# Patient Record
Sex: Male | Born: 1940 | Race: Black or African American | Hispanic: No | Smoking: Former smoker
Health system: Southern US, Community
[De-identification: ages and names within clinical notes are randomized; demographics above are authoritative.]

## PROBLEM LIST (undated history)

## (undated) DIAGNOSIS — I2510 Atherosclerotic heart disease of native coronary artery without angina pectoris: Secondary | ICD-10-CM

## (undated) DIAGNOSIS — M6281 Muscle weakness (generalized): Secondary | ICD-10-CM

## (undated) DIAGNOSIS — R609 Edema, unspecified: Secondary | ICD-10-CM

## (undated) DIAGNOSIS — D649 Anemia, unspecified: Secondary | ICD-10-CM

## (undated) DIAGNOSIS — E785 Hyperlipidemia, unspecified: Secondary | ICD-10-CM

## (undated) DIAGNOSIS — E11319 Type 2 diabetes mellitus with unspecified diabetic retinopathy without macular edema: Secondary | ICD-10-CM

## (undated) DIAGNOSIS — F329 Major depressive disorder, single episode, unspecified: Secondary | ICD-10-CM

## (undated) DIAGNOSIS — I509 Heart failure, unspecified: Secondary | ICD-10-CM

## (undated) DIAGNOSIS — I10 Essential (primary) hypertension: Secondary | ICD-10-CM

## (undated) DIAGNOSIS — G629 Polyneuropathy, unspecified: Secondary | ICD-10-CM

## (undated) DIAGNOSIS — E261 Secondary hyperaldosteronism: Secondary | ICD-10-CM

## (undated) DIAGNOSIS — F32A Depression, unspecified: Secondary | ICD-10-CM

## (undated) DIAGNOSIS — N189 Chronic kidney disease, unspecified: Secondary | ICD-10-CM

## (undated) DIAGNOSIS — G441 Vascular headache, not elsewhere classified: Secondary | ICD-10-CM

## (undated) DIAGNOSIS — I209 Angina pectoris, unspecified: Secondary | ICD-10-CM

## (undated) DIAGNOSIS — E119 Type 2 diabetes mellitus without complications: Secondary | ICD-10-CM

## (undated) HISTORY — DX: Depression, unspecified: F32.A

## (undated) HISTORY — DX: Major depressive disorder, single episode, unspecified: F32.9

## (undated) HISTORY — DX: Edema, unspecified: R60.9

## (undated) HISTORY — DX: Hyperlipidemia, unspecified: E78.5

## (undated) HISTORY — DX: Type 2 diabetes mellitus without complications: E11.9

## (undated) HISTORY — DX: Essential (primary) hypertension: I10

## (undated) HISTORY — PX: APPENDECTOMY: SHX54

## (undated) HISTORY — PX: LEG AMPUTATION BELOW KNEE: SHX694

## (undated) HISTORY — DX: Type 2 diabetes mellitus with unspecified diabetic retinopathy without macular edema: E11.319

## (undated) HISTORY — DX: Chronic kidney disease, unspecified: N18.9

---

## 2012-07-02 DIAGNOSIS — D649 Anemia, unspecified: Secondary | ICD-10-CM | POA: Diagnosis not present

## 2012-07-02 DIAGNOSIS — I10 Essential (primary) hypertension: Secondary | ICD-10-CM | POA: Diagnosis not present

## 2012-07-02 DIAGNOSIS — E1129 Type 2 diabetes mellitus with other diabetic kidney complication: Secondary | ICD-10-CM | POA: Diagnosis not present

## 2012-07-02 DIAGNOSIS — F3289 Other specified depressive episodes: Secondary | ICD-10-CM | POA: Diagnosis not present

## 2012-07-04 DIAGNOSIS — Z79899 Other long term (current) drug therapy: Secondary | ICD-10-CM | POA: Diagnosis not present

## 2012-07-04 DIAGNOSIS — E119 Type 2 diabetes mellitus without complications: Secondary | ICD-10-CM | POA: Diagnosis not present

## 2012-07-04 DIAGNOSIS — E039 Hypothyroidism, unspecified: Secondary | ICD-10-CM | POA: Diagnosis not present

## 2012-07-30 DIAGNOSIS — E785 Hyperlipidemia, unspecified: Secondary | ICD-10-CM | POA: Diagnosis not present

## 2012-07-30 DIAGNOSIS — R609 Edema, unspecified: Secondary | ICD-10-CM | POA: Diagnosis not present

## 2012-07-30 DIAGNOSIS — I10 Essential (primary) hypertension: Secondary | ICD-10-CM | POA: Diagnosis not present

## 2012-07-30 DIAGNOSIS — K5900 Constipation, unspecified: Secondary | ICD-10-CM | POA: Diagnosis not present

## 2012-07-30 DIAGNOSIS — E1129 Type 2 diabetes mellitus with other diabetic kidney complication: Secondary | ICD-10-CM | POA: Diagnosis not present

## 2012-07-30 DIAGNOSIS — I2510 Atherosclerotic heart disease of native coronary artery without angina pectoris: Secondary | ICD-10-CM | POA: Diagnosis not present

## 2012-08-15 DIAGNOSIS — H538 Other visual disturbances: Secondary | ICD-10-CM | POA: Diagnosis not present

## 2012-08-15 DIAGNOSIS — E1129 Type 2 diabetes mellitus with other diabetic kidney complication: Secondary | ICD-10-CM | POA: Diagnosis not present

## 2012-08-15 DIAGNOSIS — E785 Hyperlipidemia, unspecified: Secondary | ICD-10-CM | POA: Diagnosis not present

## 2012-08-15 DIAGNOSIS — I10 Essential (primary) hypertension: Secondary | ICD-10-CM | POA: Diagnosis not present

## 2012-08-18 ENCOUNTER — Emergency Department (HOSPITAL_COMMUNITY)
Admission: EM | Admit: 2012-08-18 | Discharge: 2012-08-18 | Disposition: A | Discharge status: Home / Self Care | Payer: Medicare Other | Attending: Emergency Medicine | Admitting: Emergency Medicine

## 2012-08-18 ENCOUNTER — Emergency Department (HOSPITAL_COMMUNITY): Payer: Medicare Other

## 2012-08-18 DIAGNOSIS — S88119A Complete traumatic amputation at level between knee and ankle, unspecified lower leg, initial encounter: Secondary | ICD-10-CM | POA: Diagnosis not present

## 2012-08-18 DIAGNOSIS — H35379 Puckering of macula, unspecified eye: Secondary | ICD-10-CM | POA: Diagnosis not present

## 2012-08-18 DIAGNOSIS — E1159 Type 2 diabetes mellitus with other circulatory complications: Secondary | ICD-10-CM | POA: Diagnosis not present

## 2012-08-18 DIAGNOSIS — G319 Degenerative disease of nervous system, unspecified: Secondary | ICD-10-CM | POA: Diagnosis not present

## 2012-08-18 DIAGNOSIS — R6889 Other general symptoms and signs: Secondary | ICD-10-CM | POA: Diagnosis not present

## 2012-08-18 DIAGNOSIS — Z79899 Other long term (current) drug therapy: Secondary | ICD-10-CM | POA: Insufficient documentation

## 2012-08-18 DIAGNOSIS — E1139 Type 2 diabetes mellitus with other diabetic ophthalmic complication: Secondary | ICD-10-CM | POA: Diagnosis not present

## 2012-08-18 DIAGNOSIS — E11319 Type 2 diabetes mellitus with unspecified diabetic retinopathy without macular edema: Secondary | ICD-10-CM | POA: Diagnosis not present

## 2012-08-18 DIAGNOSIS — S0990XA Unspecified injury of head, initial encounter: Secondary | ICD-10-CM | POA: Diagnosis not present

## 2012-08-18 DIAGNOSIS — H4310 Vitreous hemorrhage, unspecified eye: Secondary | ICD-10-CM | POA: Diagnosis not present

## 2012-08-18 DIAGNOSIS — Y9389 Activity, other specified: Secondary | ICD-10-CM | POA: Insufficient documentation

## 2012-08-18 DIAGNOSIS — Y9289 Other specified places as the place of occurrence of the external cause: Secondary | ICD-10-CM | POA: Insufficient documentation

## 2012-08-18 DIAGNOSIS — W050XXA Fall from non-moving wheelchair, initial encounter: Secondary | ICD-10-CM | POA: Insufficient documentation
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
## 2012-08-18 NOTE — ED Notes (Signed)
Called [facility] spoke with DON about pt. Discharge.

## 2012-08-18 NOTE — ED Notes (Signed)
Patient transported to CT 

## 2012-08-26 DIAGNOSIS — E11359 Type 2 diabetes mellitus with proliferative diabetic retinopathy without macular edema: Secondary | ICD-10-CM | POA: Diagnosis not present

## 2012-08-26 DIAGNOSIS — E1139 Type 2 diabetes mellitus with other diabetic ophthalmic complication: Secondary | ICD-10-CM | POA: Diagnosis not present

## 2012-08-26 DIAGNOSIS — H4310 Vitreous hemorrhage, unspecified eye: Secondary | ICD-10-CM | POA: Diagnosis not present

## 2012-08-26 DIAGNOSIS — E119 Type 2 diabetes mellitus without complications: Secondary | ICD-10-CM | POA: Diagnosis not present

## 2012-08-27 DIAGNOSIS — I10 Essential (primary) hypertension: Secondary | ICD-10-CM | POA: Diagnosis not present

## 2012-08-27 DIAGNOSIS — E1129 Type 2 diabetes mellitus with other diabetic kidney complication: Secondary | ICD-10-CM | POA: Diagnosis not present

## 2012-08-27 DIAGNOSIS — E785 Hyperlipidemia, unspecified: Secondary | ICD-10-CM | POA: Diagnosis not present

## 2012-08-27 DIAGNOSIS — I2510 Atherosclerotic heart disease of native coronary artery without angina pectoris: Secondary | ICD-10-CM | POA: Diagnosis not present

## 2012-08-27 DIAGNOSIS — K5900 Constipation, unspecified: Secondary | ICD-10-CM | POA: Diagnosis not present

## 2012-08-27 DIAGNOSIS — R609 Edema, unspecified: Secondary | ICD-10-CM | POA: Diagnosis not present

## 2012-09-03 DIAGNOSIS — E1129 Type 2 diabetes mellitus with other diabetic kidney complication: Secondary | ICD-10-CM | POA: Diagnosis not present

## 2012-09-24 DIAGNOSIS — K5900 Constipation, unspecified: Secondary | ICD-10-CM | POA: Diagnosis not present

## 2012-09-24 DIAGNOSIS — I2510 Atherosclerotic heart disease of native coronary artery without angina pectoris: Secondary | ICD-10-CM | POA: Diagnosis not present

## 2012-09-24 DIAGNOSIS — R609 Edema, unspecified: Secondary | ICD-10-CM | POA: Diagnosis not present

## 2012-09-24 DIAGNOSIS — I10 Essential (primary) hypertension: Secondary | ICD-10-CM | POA: Diagnosis not present

## 2012-09-24 DIAGNOSIS — E1129 Type 2 diabetes mellitus with other diabetic kidney complication: Secondary | ICD-10-CM | POA: Diagnosis not present

## 2012-09-24 DIAGNOSIS — E785 Hyperlipidemia, unspecified: Secondary | ICD-10-CM | POA: Diagnosis not present

## 2012-10-29 DIAGNOSIS — E1129 Type 2 diabetes mellitus with other diabetic kidney complication: Secondary | ICD-10-CM | POA: Diagnosis not present

## 2012-10-29 DIAGNOSIS — I2510 Atherosclerotic heart disease of native coronary artery without angina pectoris: Secondary | ICD-10-CM | POA: Diagnosis not present

## 2012-10-29 DIAGNOSIS — E785 Hyperlipidemia, unspecified: Secondary | ICD-10-CM | POA: Diagnosis not present

## 2012-10-29 DIAGNOSIS — K5900 Constipation, unspecified: Secondary | ICD-10-CM | POA: Diagnosis not present

## 2012-10-29 DIAGNOSIS — I10 Essential (primary) hypertension: Secondary | ICD-10-CM | POA: Diagnosis not present

## 2012-10-29 DIAGNOSIS — R609 Edema, unspecified: Secondary | ICD-10-CM | POA: Diagnosis not present

## 2012-10-31 DIAGNOSIS — I10 Essential (primary) hypertension: Secondary | ICD-10-CM | POA: Diagnosis not present

## 2012-10-31 DIAGNOSIS — E119 Type 2 diabetes mellitus without complications: Secondary | ICD-10-CM | POA: Diagnosis not present

## 2012-10-31 DIAGNOSIS — G608 Other hereditary and idiopathic neuropathies: Secondary | ICD-10-CM | POA: Diagnosis not present

## 2012-11-05 DIAGNOSIS — E1129 Type 2 diabetes mellitus with other diabetic kidney complication: Secondary | ICD-10-CM | POA: Diagnosis not present

## 2012-11-06 DIAGNOSIS — R0989 Other specified symptoms and signs involving the circulatory and respiratory systems: Secondary | ICD-10-CM | POA: Diagnosis not present

## 2012-11-12 DIAGNOSIS — S88919A Complete traumatic amputation of unspecified lower leg, level unspecified, initial encounter: Secondary | ICD-10-CM | POA: Diagnosis not present

## 2012-11-12 DIAGNOSIS — Z7982 Long term (current) use of aspirin: Secondary | ICD-10-CM | POA: Diagnosis not present

## 2012-11-12 DIAGNOSIS — Z87891 Personal history of nicotine dependence: Secondary | ICD-10-CM | POA: Diagnosis not present

## 2012-11-12 DIAGNOSIS — E1139 Type 2 diabetes mellitus with other diabetic ophthalmic complication: Secondary | ICD-10-CM | POA: Diagnosis not present

## 2012-11-12 DIAGNOSIS — E785 Hyperlipidemia, unspecified: Secondary | ICD-10-CM | POA: Diagnosis not present

## 2012-11-12 DIAGNOSIS — E11359 Type 2 diabetes mellitus with proliferative diabetic retinopathy without macular edema: Secondary | ICD-10-CM | POA: Diagnosis not present

## 2012-11-12 DIAGNOSIS — E119 Type 2 diabetes mellitus without complications: Secondary | ICD-10-CM | POA: Diagnosis not present

## 2012-11-12 DIAGNOSIS — I129 Hypertensive chronic kidney disease with stage 1 through stage 4 chronic kidney disease, or unspecified chronic kidney disease: Secondary | ICD-10-CM | POA: Diagnosis not present

## 2012-11-12 DIAGNOSIS — N189 Chronic kidney disease, unspecified: Secondary | ICD-10-CM | POA: Diagnosis not present

## 2012-11-12 DIAGNOSIS — I491 Atrial premature depolarization: Secondary | ICD-10-CM | POA: Diagnosis not present

## 2012-11-12 DIAGNOSIS — Z961 Presence of intraocular lens: Secondary | ICD-10-CM | POA: Diagnosis not present

## 2012-11-12 DIAGNOSIS — Z794 Long term (current) use of insulin: Secondary | ICD-10-CM | POA: Diagnosis not present

## 2012-11-12 DIAGNOSIS — I2510 Atherosclerotic heart disease of native coronary artery without angina pectoris: Secondary | ICD-10-CM | POA: Diagnosis not present

## 2012-11-12 DIAGNOSIS — H3340 Traction detachment of retina, unspecified eye: Secondary | ICD-10-CM | POA: Diagnosis not present

## 2012-11-12 DIAGNOSIS — Z9849 Cataract extraction status, unspecified eye: Secondary | ICD-10-CM | POA: Diagnosis not present

## 2012-11-12 DIAGNOSIS — H4310 Vitreous hemorrhage, unspecified eye: Secondary | ICD-10-CM | POA: Diagnosis not present

## 2012-11-12 HISTORY — PX: OTHER SURGICAL HISTORY: SHX169

## 2012-12-17 DIAGNOSIS — E785 Hyperlipidemia, unspecified: Secondary | ICD-10-CM | POA: Diagnosis not present

## 2012-12-17 DIAGNOSIS — E1129 Type 2 diabetes mellitus with other diabetic kidney complication: Secondary | ICD-10-CM | POA: Diagnosis not present

## 2012-12-17 DIAGNOSIS — I2510 Atherosclerotic heart disease of native coronary artery without angina pectoris: Secondary | ICD-10-CM | POA: Diagnosis not present

## 2012-12-17 DIAGNOSIS — I10 Essential (primary) hypertension: Secondary | ICD-10-CM | POA: Diagnosis not present

## 2012-12-17 DIAGNOSIS — R609 Edema, unspecified: Secondary | ICD-10-CM | POA: Diagnosis not present

## 2012-12-31 DIAGNOSIS — G608 Other hereditary and idiopathic neuropathies: Secondary | ICD-10-CM | POA: Diagnosis not present

## 2013-01-21 ENCOUNTER — Encounter: Payer: Self-pay | Admitting: Adult Health

## 2013-01-21 ENCOUNTER — Non-Acute Institutional Stay (SKILLED_NURSING_FACILITY): Payer: Medicare Other | Admitting: Adult Health

## 2013-01-21 DIAGNOSIS — K5900 Constipation, unspecified: Secondary | ICD-10-CM | POA: Insufficient documentation

## 2013-01-21 DIAGNOSIS — R609 Edema, unspecified: Secondary | ICD-10-CM

## 2013-01-21 DIAGNOSIS — N058 Unspecified nephritic syndrome with other morphologic changes: Secondary | ICD-10-CM

## 2013-01-21 DIAGNOSIS — E785 Hyperlipidemia, unspecified: Secondary | ICD-10-CM | POA: Insufficient documentation

## 2013-01-21 DIAGNOSIS — I10 Essential (primary) hypertension: Secondary | ICD-10-CM

## 2013-01-21 DIAGNOSIS — I2510 Atherosclerotic heart disease of native coronary artery without angina pectoris: Secondary | ICD-10-CM | POA: Diagnosis not present

## 2013-01-21 DIAGNOSIS — E1129 Type 2 diabetes mellitus with other diabetic kidney complication: Secondary | ICD-10-CM | POA: Diagnosis not present

## 2013-01-21 DIAGNOSIS — F3289 Other specified depressive episodes: Secondary | ICD-10-CM | POA: Insufficient documentation

## 2013-01-21 DIAGNOSIS — E1139 Type 2 diabetes mellitus with other diabetic ophthalmic complication: Secondary | ICD-10-CM | POA: Diagnosis not present

## 2013-01-21 DIAGNOSIS — E11319 Type 2 diabetes mellitus with unspecified diabetic retinopathy without macular edema: Secondary | ICD-10-CM

## 2013-01-22 ENCOUNTER — Non-Acute Institutional Stay (SKILLED_NURSING_FACILITY): Payer: Medicare Other | Admitting: Internal Medicine

## 2013-01-22 DIAGNOSIS — H5710 Ocular pain, unspecified eye: Secondary | ICD-10-CM | POA: Diagnosis not present

## 2013-01-22 DIAGNOSIS — H5711 Ocular pain, right eye: Secondary | ICD-10-CM

## 2013-01-22 NOTE — Progress Notes (Signed)
Patient ID: Marc Wheeler, male   DOB: May 11, 1941, 72 y.o.   MRN: [id number]  CC- right eye pain  HPI- Ranferi Clingan has been having pain with redness and watering in his right eye for 2-3 days now. He has hx of retinal detachment in right eye and recently underwent surgery in January. He has had follow up with ophtalmology after that in February. This problem started acutely 2 days back No pain or redness in left eye.  Denies any acute change in vision  Review of Systems  Eyes: Positive for pain, discharge and redness. Negative for blurred vision, double vision and photophobia.  Neurological: Negative for headaches.   BP 141/87  Pulse 82  Temp(Src) 98 F (36.7 C)  Resp 16  Ht 4\' 4"  (1.321 m)  Wt 223 lb (101.152 kg)  BMI 57.97 kg/m2  SpO2 98%  Physical Exam  Constitutional: He appears well-developed and well-nourished.  HENT:  Head: Normocephalic and atraumatic.  Mouth/Throat: Oropharynx is clear and moist.  Eyes: EOM are normal. Pupils are equal, round, and reactive to light. Right eye exhibits discharge. Right eye exhibits no exudate and no hordeolum. No foreign body present in the right eye. Right conjunctiva is injected. Right conjunctiva has a hemorrhage. No scleral icterus.  normal left eye  ASSESSMENT/PLAN-  Acute right eye pain New onset with redness and lacrimation. Will have him be seen by ophtho for retinal exam-appointment being made. Will start him prophylactically on ciprofloxacin eye drop every 2 hr wile awake in right eye for now. If pt has double vision or worsening of vision or headache, to notify immediately

## 2013-01-22 NOTE — Assessment & Plan Note (Signed)
New onset with redness and lacrimation. Will have him be seen by ophtho for retinal exam-appointment being made. Will start him prophylactically on ciprofloxacin eye drop every 2 hr wile awake in right eye for now. If pt has double vision or worsening of vision or headache, to notify immediately

## 2013-01-23 DIAGNOSIS — H3340 Traction detachment of retina, unspecified eye: Secondary | ICD-10-CM | POA: Diagnosis not present

## 2013-01-23 DIAGNOSIS — Z961 Presence of intraocular lens: Secondary | ICD-10-CM | POA: Diagnosis not present

## 2013-01-23 DIAGNOSIS — E11359 Type 2 diabetes mellitus with proliferative diabetic retinopathy without macular edema: Secondary | ICD-10-CM | POA: Diagnosis not present

## 2013-01-23 DIAGNOSIS — E119 Type 2 diabetes mellitus without complications: Secondary | ICD-10-CM | POA: Diagnosis not present

## 2013-01-23 DIAGNOSIS — H209 Unspecified iridocyclitis: Secondary | ICD-10-CM | POA: Diagnosis not present

## 2013-04-09 DIAGNOSIS — E1139 Type 2 diabetes mellitus with other diabetic ophthalmic complication: Secondary | ICD-10-CM | POA: Diagnosis not present

## 2013-04-09 DIAGNOSIS — E11311 Type 2 diabetes mellitus with unspecified diabetic retinopathy with macular edema: Secondary | ICD-10-CM | POA: Diagnosis not present

## 2013-04-09 DIAGNOSIS — E11359 Type 2 diabetes mellitus with proliferative diabetic retinopathy without macular edema: Secondary | ICD-10-CM | POA: Diagnosis not present

## 2013-04-09 DIAGNOSIS — H35379 Puckering of macula, unspecified eye: Secondary | ICD-10-CM | POA: Diagnosis not present

## 2013-04-09 DIAGNOSIS — H4310 Vitreous hemorrhage, unspecified eye: Secondary | ICD-10-CM | POA: Diagnosis not present

## 2013-04-17 ENCOUNTER — Non-Acute Institutional Stay (SKILLED_NURSING_FACILITY): Payer: Medicare Other | Admitting: Internal Medicine

## 2013-04-17 DIAGNOSIS — R609 Edema, unspecified: Secondary | ICD-10-CM

## 2013-04-17 DIAGNOSIS — I10 Essential (primary) hypertension: Secondary | ICD-10-CM

## 2013-04-17 DIAGNOSIS — N058 Unspecified nephritic syndrome with other morphologic changes: Secondary | ICD-10-CM

## 2013-04-17 DIAGNOSIS — E785 Hyperlipidemia, unspecified: Secondary | ICD-10-CM

## 2013-04-17 DIAGNOSIS — IMO0002 Reserved for concepts with insufficient information to code with codable children: Secondary | ICD-10-CM

## 2013-04-17 DIAGNOSIS — E1165 Type 2 diabetes mellitus with hyperglycemia: Secondary | ICD-10-CM

## 2013-04-17 DIAGNOSIS — E1129 Type 2 diabetes mellitus with other diabetic kidney complication: Secondary | ICD-10-CM | POA: Diagnosis not present

## 2013-04-17 NOTE — Progress Notes (Signed)
Patient ID: Marc Wheeler, male   DOB: 12-28-1940, 72 y.o.   MRN: [id number]   [facility]  Code Status: full code  No Known Allergies  Chief Complaint: medical management of chronic illness  HPI:  Patient seen today for routine visit. He denies any complaints. No new concerns from staff. He is non complaint with his diet. His bp remains stable  Review of Systems  Constitutional: Negative for fever, chills and diaphoresis.  HENT: Negative for congestion.   Eyes: Negative for blurred vision.  Respiratory: Negative for cough and shortness of breath.   Cardiovascular: Negative for chest pain, palpitations and leg swelling.  Gastrointestinal: Negative for heartburn, nausea, vomiting and abdominal pain.  Genitourinary: Negative for dysuria.  Musculoskeletal: Negative for myalgias and falls.  Skin: Negative for rash.  Neurological: Negative for dizziness and headaches.  Psychiatric/Behavioral: Negative for depression and memory loss.     Past Medical History  Diagnosis Date  . Diabetes mellitus without complication   . Hyperlipidemia   . Hypertension   . Chronic kidney disease   . Edema   . Depression   . Diabetic retinopathy    Past Surgical History  Procedure Laterality Date  . Leg amputation below knee Bilateral   . Right retinal detachment repair  1 22 14    Social History:   reports that he has quit smoking. He does not have any smokeless tobacco history on file. His alcohol and drug histories are not on file.  Family History  Problem Relation Age of Onset  . Family history unknown: Yes    Medications: Patient's Medications  New Prescriptions   No medications on file  Previous Medications   ACETAMINOPHEN (TYLENOL) 325 MG TABLET    Take 650 mg by mouth every 4 (four) hours as needed for pain.   AMLODIPINE (NORVASC) 10 MG TABLET    Take 10 mg by mouth daily.   ASPIRIN EC 81 MG TABLET    Take 81 mg by mouth daily.   ATORVASTATIN (LIPITOR) 80 MG TABLET     Take 80 mg by mouth daily.   B COMPLEX-VITAMIN C-FOLIC ACID (NEPHRO-VITE) 0.8 MG TABS    Take 0.8 mg by mouth at bedtime.   CLONIDINE (CATAPRES - DOSED IN MG/24 HR) 0.2 MG/24HR PATCH    Place 1 patch onto the skin once a week.   DOCUSATE SODIUM (COLACE) 100 MG CAPSULE    Take 100 mg by mouth 2 (two) times daily.   FISH OIL-OMEGA-3 FATTY ACIDS 1000 MG CAPSULE    Take 1 g by mouth daily.   FUROSEMIDE (LASIX) 80 MG TABLET    Take 60 mg by mouth daily.    HYDRALAZINE (APRESOLINE) 10 MG TABLET    Take 10 mg by mouth 2 (two) times daily.   INSULIN ASPART (NOVOLOG) 100 UNIT/ML INJECTION    Inject 25 Units into the skin 3 (three) times daily before meals. 25 units prior to meals with an additional 5 units for cbg >=150   INSULIN GLARGINE (LANTUS) 100 UNIT/ML INJECTION    Inject 100 Units into the skin at bedtime.    LINAGLIPTIN (TRADJENTA) 5 MG TABS TABLET    Take 5 mg by mouth daily.   LOPERAMIDE (IMODIUM A-D) 2 MG TABLET    Take 2 mg by mouth 4 (four) times daily as needed. For diarrhea   METOPROLOL (LOPRESSOR) 100 MG TABLET    Take 100 mg by mouth 2 (two) times daily.   POLYETHYL GLYCOL-PROPYL GLYCOL (SYSTANE) 0.4-0.3 %  SOLN    Apply 1 drop to eye daily. Both eyes  Modified Medications   No medications on file  Discontinued Medications   BRIMONIDINE (ALPHAGAN P) 0.1 % SOLN    Place 1 drop into the right eye 3 (three) times daily.   DORZOLAMIDE-TIMOLOL (COSOPT) 22.3-6.8 MG/ML OPHTHALMIC SOLUTION    Place 1 drop into the right eye 2 (two) times daily.   HYDROCODONE-ACETAMINOPHEN (VICODIN) 5-500 MG PER TABLET    Take 1 tablet by mouth every 4 (four) hours as needed. For pain   MIRTAZAPINE (REMERON) 15 MG TABLET    Take 15 mg by mouth at bedtime.   PREDNISOLONE ACETATE (PRED FORTE) 1 % OPHTHALMIC SUSPENSION    Place 1 drop into the right eye 4 (four) times daily.   TOBRAMYCIN-DEXAMETHASONE (TOBRADEX) OPHTHALMIC OINTMENT    Place 1 application into the right eye at bedtime. 1/2 inch     Physical  Exam: Filed Vitals:   04/17/13 1353  BP: 128/74  Pulse: 68  Temp: 96.6 F (35.9 C)  Resp: 18  Height: 4\' 4"  (1.321 m)  Weight: 225 lb (102.059 kg)  SpO2: 95%   Physical Exam  Constitutional: He is oriented to person, place, and time.  Well built male in NAD  HENT:  Head: Normocephalic and atraumatic.  Mouth/Throat: Oropharynx is clear and moist.  Eyes: Pupils are equal, round, and reactive to light.  Neck: Normal range of motion. Neck supple. No tracheal deviation present.  Cardiovascular: Normal rate and regular rhythm.   Pulmonary/Chest: Effort normal and breath sounds normal.  Abdominal: Soft. Bowel sounds are normal.  Musculoskeletal: Normal range of motion.  B/l BKA  Lymphadenopathy:    He has no cervical adenopathy.  Neurological: He is alert and oriented to person, place, and time.  Skin: Skin is warm and dry. He is not diaphoretic.  Psychiatric: He has a normal mood and affect.    Labs reviewed:  12/31/12 Na 141, k 4.2, bun 34, cr 1.88, ca 9.3, glu 199  1/14 a1c 10.5, t.chol 112, tg 204, hdl 24, ldl 47  Assessment/Plan  DM type 2 uncontrolled cbg averages between 160-368 at night time and 136-443 in daytime. Non compliant with diet. Will change his lantus to 55 u bid from 100 u daily. Change novolog to 25 u tid only. Recheck a1c. Continue statin and ASA. Dietary compliance reinforced  Hyperlipidemia Recheck flp. Continue statin and fish oil  HTN bp remains stable. Continue amlodipine, clonidine, hydralazine and lasix with metoprolol  Edema Stable on current dose of lasix. Will check bmp  Labs/tests ordered- a1c, flp, cbc

## 2013-04-20 DIAGNOSIS — I10 Essential (primary) hypertension: Secondary | ICD-10-CM | POA: Diagnosis not present

## 2013-04-20 DIAGNOSIS — E11319 Type 2 diabetes mellitus with unspecified diabetic retinopathy without macular edema: Secondary | ICD-10-CM | POA: Diagnosis not present

## 2013-04-20 DIAGNOSIS — E785 Hyperlipidemia, unspecified: Secondary | ICD-10-CM | POA: Diagnosis not present

## 2013-04-20 DIAGNOSIS — E119 Type 2 diabetes mellitus without complications: Secondary | ICD-10-CM | POA: Diagnosis not present

## 2013-04-21 ENCOUNTER — Non-Acute Institutional Stay (SKILLED_NURSING_FACILITY): Payer: Medicare Other | Admitting: Internal Medicine

## 2013-04-21 DIAGNOSIS — E1129 Type 2 diabetes mellitus with other diabetic kidney complication: Secondary | ICD-10-CM | POA: Diagnosis not present

## 2013-04-21 DIAGNOSIS — N058 Unspecified nephritic syndrome with other morphologic changes: Secondary | ICD-10-CM | POA: Diagnosis not present

## 2013-04-21 DIAGNOSIS — E1165 Type 2 diabetes mellitus with hyperglycemia: Secondary | ICD-10-CM

## 2013-04-21 DIAGNOSIS — E785 Hyperlipidemia, unspecified: Secondary | ICD-10-CM

## 2013-04-21 DIAGNOSIS — IMO0002 Reserved for concepts with insufficient information to code with codable children: Secondary | ICD-10-CM

## 2013-04-21 MED ORDER — INSULIN GLARGINE 100 UNIT/ML ~~LOC~~ SOLN: 55.0000 [IU] | Freq: Two times a day (BID) | SUBCUTANEOUS | Status: DC

## 2013-04-21 MED ORDER — INSULIN ASPART 100 UNIT/ML ~~LOC~~ SOLN: 28.0000 [IU] | Freq: Three times a day (TID) | SUBCUTANEOUS | Status: DC

## 2013-04-21 NOTE — Progress Notes (Signed)
Patient ID: Marc Wheeler, male   DOB: 07-08-1941, 72 y.o.   MRN: [id number] Location:  [facility] Provider:  [name]. Renato Gails, D.O., C.M.D.  Code Status:  Full code  Chief Complaint: AV:  Uncontrolled diabetes, lab results  HPI:  72 yo nonadherent black male long term care resident seen for acute visit to review glucose.    Review of Systems:  Review of Systems  Constitutional: Negative for fever, chills and weight loss.  HENT: Negative for congestion.   Eyes: Positive for blurred vision.  Respiratory: Negative for shortness of breath.   Cardiovascular: Positive for leg swelling. Negative for chest pain.  Gastrointestinal: Negative for constipation.  Genitourinary: Negative for dysuria.  Musculoskeletal: Negative for falls and myalgias.  Neurological: Negative for dizziness, loss of consciousness and headaches.  Endo/Heme/Allergies: Does not bruise/bleed easily.  Psychiatric/Behavioral: Negative for memory loss.     Medications: Patient's Medications  New Prescriptions   No medications on file  Previous Medications   ACETAMINOPHEN (TYLENOL) 325 MG TABLET    Take 650 mg by mouth every 4 (four) hours as needed for pain.   AMLODIPINE (NORVASC) 10 MG TABLET    Take 10 mg by mouth daily.   ASPIRIN EC 81 MG TABLET    Take 81 mg by mouth daily.   ATORVASTATIN (LIPITOR) 80 MG TABLET    Take 80 mg by mouth daily.   B COMPLEX-VITAMIN C-FOLIC ACID (NEPHRO-VITE) 0.8 MG TABS    Take 0.8 mg by mouth at bedtime.   CLONIDINE (CATAPRES - DOSED IN MG/24 HR) 0.2 MG/24HR PATCH    Place 1 patch onto the skin once a week.   DOCUSATE SODIUM (COLACE) 100 MG CAPSULE    Take 100 mg by mouth 2 (two) times daily.   FISH OIL-OMEGA-3 FATTY ACIDS 1000 MG CAPSULE    Take 1 g by mouth daily.   FUROSEMIDE (LASIX) 80 MG TABLET    Take 60 mg by mouth daily.    HYDRALAZINE (APRESOLINE) 10 MG TABLET    Take 10 mg by mouth 2 (two) times daily.   INSULIN ASPART (NOVOLOG) 100 UNIT/ML INJECTION     Inject 25 Units into the skin 3 (three) times daily before meals. 25 units prior to meals with an additional 5 units for cbg >=150   INSULIN GLARGINE (LANTUS) 100 UNIT/ML INJECTION    Inject 100 Units into the skin at bedtime.    LINAGLIPTIN (TRADJENTA) 5 MG TABS TABLET    Take 5 mg by mouth daily.   LOPERAMIDE (IMODIUM A-D) 2 MG TABLET    Take 2 mg by mouth 4 (four) times daily as needed. For diarrhea   METOPROLOL (LOPRESSOR) 100 MG TABLET    Take 100 mg by mouth 2 (two) times daily.   POLYETHYL GLYCOL-PROPYL GLYCOL (SYSTANE) 0.4-0.3 % SOLN    Apply 1 drop to eye daily. Both eyes  Modified Medications   No medications on file  Discontinued Medications   No medications on file    Physical Exam:  Physical Exam  Constitutional: He is oriented to person, place, and time. He appears well-developed and well-nourished. No distress.  Neck: Neck supple. No JVD present.  Cardiovascular: Normal rate, regular rhythm and normal heart sounds.   Edema of bilateral stumps  Pulmonary/Chest: Effort normal and breath sounds normal. No respiratory distress.  Abdominal: Soft. Bowel sounds are normal. He exhibits no distension. There is no tenderness.  Musculoskeletal:  Bilateral bka  Neurological: He is alert and oriented to  person, place, and time.  Skin: Skin is warm and dry.  Psychiatric: He has a normal mood and affect.   Labs reviewed: 04/20/13:  Wbc 6.3, h/h 10.5/33.3, plts 211, tc 100, hdl 22, LDL 52, TG 130, hba1c 9.6 (down from 10.5 on 10/31/12)  Assessment/Plan 1. DM (diabetes mellitus), type 2, uncontrolled, with renal complications -reviewed importance of following his diabetic diet especially watching his sodas, tea, sweet drinks Insulin was adjusted last time and cbg will need to be reviewed again next week after increasing novolog from 25 to 28 units ac meals, hba1c in 3 mos  2. Hyperlipidemia LDL goal <70 -at goal with current therapy, again encouraged healthy diet  Family/ staff  Communication: discussed with pt and unit supervisor  Labs/tests ordered:  hba1c 3 mos

## 2013-05-05 DIAGNOSIS — Z961 Presence of intraocular lens: Secondary | ICD-10-CM | POA: Diagnosis not present

## 2013-05-05 DIAGNOSIS — H3340 Traction detachment of retina, unspecified eye: Secondary | ICD-10-CM | POA: Diagnosis not present

## 2013-05-05 DIAGNOSIS — E11359 Type 2 diabetes mellitus with proliferative diabetic retinopathy without macular edema: Secondary | ICD-10-CM | POA: Diagnosis not present

## 2013-05-05 DIAGNOSIS — S88919A Complete traumatic amputation of unspecified lower leg, level unspecified, initial encounter: Secondary | ICD-10-CM | POA: Diagnosis not present

## 2013-05-05 DIAGNOSIS — E119 Type 2 diabetes mellitus without complications: Secondary | ICD-10-CM | POA: Diagnosis not present

## 2013-05-05 DIAGNOSIS — H3581 Retinal edema: Secondary | ICD-10-CM | POA: Diagnosis not present

## 2013-05-05 DIAGNOSIS — H209 Unspecified iridocyclitis: Secondary | ICD-10-CM | POA: Diagnosis not present

## 2013-06-04 ENCOUNTER — Non-Acute Institutional Stay (SKILLED_NURSING_FACILITY): Payer: Medicare Other | Admitting: Internal Medicine

## 2013-06-04 DIAGNOSIS — E1165 Type 2 diabetes mellitus with hyperglycemia: Secondary | ICD-10-CM | POA: Diagnosis not present

## 2013-06-04 DIAGNOSIS — R609 Edema, unspecified: Secondary | ICD-10-CM | POA: Diagnosis not present

## 2013-06-04 DIAGNOSIS — E785 Hyperlipidemia, unspecified: Secondary | ICD-10-CM

## 2013-06-04 DIAGNOSIS — I10 Essential (primary) hypertension: Secondary | ICD-10-CM | POA: Diagnosis not present

## 2013-06-04 DIAGNOSIS — E1129 Type 2 diabetes mellitus with other diabetic kidney complication: Secondary | ICD-10-CM

## 2013-06-04 DIAGNOSIS — N058 Unspecified nephritic syndrome with other morphologic changes: Secondary | ICD-10-CM

## 2013-06-04 DIAGNOSIS — I2510 Atherosclerotic heart disease of native coronary artery without angina pectoris: Secondary | ICD-10-CM | POA: Diagnosis not present

## 2013-06-04 DIAGNOSIS — Z0000 Encounter for general adult medical examination without abnormal findings: Secondary | ICD-10-CM

## 2013-06-04 DIAGNOSIS — H04129 Dry eye syndrome of unspecified lacrimal gland: Secondary | ICD-10-CM

## 2013-06-04 DIAGNOSIS — H04123 Dry eye syndrome of bilateral lacrimal glands: Secondary | ICD-10-CM

## 2013-06-04 NOTE — Progress Notes (Signed)
Patient ID: Marc Wheeler, male   DOB: May 30, 1941, 72 y.o.   MRN: [id number]  Chief Complaint  Patient presents with  . Medical Managment of Chronic Issues  . Annual Exam   [facility]  Code Status: full code  No Known Allergies  HPI:   Patient seen today for his annual visit. He felt tightness in his chest in central area yesterday evening while having his meals as per patient. He was eating rice and felt it to be dry and was eating fast when all of a sudden he felt the chest discomfort. Denies any nausea, vomiting, SOB during that time. Denies any palpitations during the episode. It subsided in about 10 minutes. As per staff his bp was 200/90 during that time and his bp meds were given and recheck of his bp in half an hour was normal. He had no such problem during breakfast this am.  He denies any complaints today. No new concerns from staff. He is non complaint with his diet. His bp remains stable today He is here for long term care and is independent with his feeding, wheels himself around, independent with dressing and toileting cbg reviewed and is 137-280 with 2 readings in 300  Review of Systems  Constitutional: Negative for fever, chills and diaphoresis.  HENT: Negative for congestion.   Eyes: Negative for blurred vision.  Respiratory: Negative for cough and shortness of breath.   Cardiovascular: Negative for chest pain, palpitations and leg swelling.  Gastrointestinal: Negative for heartburn, nausea, vomiting and abdominal pain.  Genitourinary: Negative for dysuria.  Musculoskeletal: Negative for myalgias and falls.  Skin: Negative for rash.  Neurological: Negative for dizziness and headaches.  Psychiatric/Behavioral: Negative for depression and memory loss.   Past Medical History  Diagnosis Date  . Diabetes mellitus without complication   . Hyperlipidemia   . Hypertension   . Chronic kidney disease   . Edema   . Depression   . Diabetic retinopathy    Past  Surgical History  Procedure Laterality Date  . Leg amputation below knee Bilateral   . Right retinal detachment repair  1 22 14    Family History  Problem Relation Age of Onset  . Family history unknown: Yes   Current Outpatient Prescriptions on File Prior to Visit  Medication Sig Dispense Refill  . acetaminophen (TYLENOL) 325 MG tablet Take 650 mg by mouth every 4 (four) hours as needed for pain.      Marland Kitchen amLODipine (NORVASC) 10 MG tablet Take 10 mg by mouth daily.      Marland Kitchen aspirin EC 81 MG tablet Take 81 mg by mouth daily.      Marland Kitchen atorvastatin (LIPITOR) 80 MG tablet Take 80 mg by mouth daily.      Marland Kitchen b complex-vitamin c-folic acid (NEPHRO-VITE) 0.8 MG TABS Take 0.8 mg by mouth at bedtime.      . cloNIDine (CATAPRES - DOSED IN MG/24 HR) 0.2 mg/24hr patch Place 1 patch onto the skin once a week.      . docusate sodium (COLACE) 100 MG capsule Take 100 mg by mouth 2 (two) times daily.      . fish oil-omega-3 fatty acids 1000 MG capsule Take 1 g by mouth daily.      . furosemide (LASIX) 80 MG tablet Take 60 mg by mouth daily.       . hydrALAZINE (APRESOLINE) 10 MG tablet Take 10 mg by mouth 2 (two) times daily.      . insulin glargine (  LANTUS) 100 UNIT/ML injection Inject 0.55 mLs (55 Units total) into the skin 2 (two) times daily.  10 mL  0  . linagliptin (TRADJENTA) 5 MG TABS tablet Take 5 mg by mouth daily.      Marland Kitchen loperamide (IMODIUM A-D) 2 MG tablet Take 2 mg by mouth 4 (four) times daily as needed. For diarrhea      . metoprolol (LOPRESSOR) 100 MG tablet Take 100 mg by mouth 2 (two) times daily.      Bertram Gala Glycol-Propyl Glycol (SYSTANE) 0.4-0.3 % SOLN Apply 1 drop to eye daily. Both eyes       No current facility-administered medications on file prior to visit.    Physical exam  BP 138/77  Pulse 74  Temp(Src) 97.8 F (36.6 C)  Resp 18  Ht 4\' 4"  (1.321 m)  Wt 224 lb (101.606 kg)  BMI 58.23 kg/m2  SpO2 95%  Constitutional: He is oriented to person, place, and time.  Well  built male in NAD  HENT:   Head: Normocephalic and atraumatic.   Mouth/Throat: Oropharynx is clear and moist.  Eyes: Pupils are equal, round, and reactive to light.  Neck: Normal range of motion. Neck supple. No tracheal deviation present.  Cardiovascular: Normal rate and regular rhythm.   Pulmonary/Chest: Effort normal and breath sounds normal.  Abdominal: Soft. Bowel sounds are normal.  Musculoskeletal: Normal range of motion. On a wheelchair B/l BKA  Lymphadenopathy:   He has no cervical adenopathy.  Neurological: He is alert and oriented to person, place, and time. Sensation intact Skin: Skin is warm and dry. He is not diaphoretic.  Psychiatric: He has a normal mood and affect.   Labs reviewed:  12/31/12 Na 141, k 4.2, bun 34, cr 1.88, ca 9.3, glu 199  1/14 a1c 10.5, t.chol 112, tg 204, hdl 24, ldl 47  04/20/13 Wbc 6.3, hb 10.5, hct 33.3, plt 211, t.chol 100, tg 130, hdl 22, ldl 52, a1c 9.6  Assessment/Plan  DM type 2 uncontrolled cbg averages between 1300-300. His lantus dose was adjusted and has been tolerating it well. Continue lantus 55 u bid and SSI. Continue tradjenta. Non compliant with diet. Continue statin and ASA. Dietary compliance reinforced. uptodate with eye exam. Check urine microalbumin. Not on ACEI/ARB. Check bmp  Hyperlipidemia ldl at goal. Continue statin and fish oil  HTN bp remains stable. Continue amlodipine, clonidine, hydralazine, lasix with metoprolol. Check bmp  Edema Stable on current dose of lasix. Monitor weight  Xerophthalmia Continue systane eye drops  General exam Will check FOBT for rectal exam. uptodate with immunization. Will also have him on ca-vit d bid for now  CAD Remains chest pain free. Continue b blocker, statin, asa for now  Labs/tests ordered- a1c, cmp, urine microalbumin

## 2013-06-05 DIAGNOSIS — N390 Urinary tract infection, site not specified: Secondary | ICD-10-CM | POA: Diagnosis not present

## 2013-07-01 DIAGNOSIS — Z961 Presence of intraocular lens: Secondary | ICD-10-CM | POA: Diagnosis not present

## 2013-07-01 DIAGNOSIS — E11359 Type 2 diabetes mellitus with proliferative diabetic retinopathy without macular edema: Secondary | ICD-10-CM | POA: Diagnosis not present

## 2013-07-03 ENCOUNTER — Non-Acute Institutional Stay (SKILLED_NURSING_FACILITY): Payer: Medicare Other | Admitting: Internal Medicine

## 2013-07-03 DIAGNOSIS — N058 Unspecified nephritic syndrome with other morphologic changes: Secondary | ICD-10-CM

## 2013-07-03 DIAGNOSIS — E785 Hyperlipidemia, unspecified: Secondary | ICD-10-CM

## 2013-07-03 DIAGNOSIS — I10 Essential (primary) hypertension: Secondary | ICD-10-CM | POA: Diagnosis not present

## 2013-07-03 DIAGNOSIS — E1129 Type 2 diabetes mellitus with other diabetic kidney complication: Secondary | ICD-10-CM

## 2013-07-03 NOTE — Progress Notes (Signed)
Patient ID: Marc Wheeler, male   DOB: 1941/04/29, 72 y.o.   MRN: [id number]  [facility] [place]  Code Status: full code  No Known Allergies  Cc- RV  HPI:   Patient seen today for routine visit. He was having discomfort in both his eyes and had seen his eye doctor. He is on prednisone ey drop daily for now and follows with ophtho next week.  He denies any complaints this visit SBP running between 130-150 and DBP 60-80 He had a reading of cbg 75 yesterday evening after skipping dinner cbg reviewed 105-277, better controlled than before  Review of Systems   Constitutional: Negative for fever, chills and diaphoresis.   HENT: Negative for congestion.    Eyes: Negative for blurred vision.   Respiratory: Negative for cough and shortness of breath.    Cardiovascular: Negative for chest pain, palpitations and leg swelling.   Gastrointestinal: Negative for heartburn, nausea, vomiting and abdominal pain.   Genitourinary: Negative for dysuria.   Musculoskeletal: Negative for myalgias and falls.   Skin: Negative for rash.   Neurological: Negative for dizziness and headaches.   Psychiatric/Behavioral: Negative for depression and memory loss.   Past Medical History  Diagnosis Date  . Diabetes mellitus without complication   . Hyperlipidemia   . Hypertension   . Chronic kidney disease   . Edema   . Depression   . Diabetic retinopathy    Current Outpatient Prescriptions on File Prior to Visit  Medication Sig Dispense Refill  . acetaminophen (TYLENOL) 325 MG tablet Take 650 mg by mouth every 4 (four) hours as needed for pain.      Marland Kitchen amLODipine (NORVASC) 10 MG tablet Take 10 mg by mouth daily.      Marland Kitchen aspirin EC 81 MG tablet Take 81 mg by mouth daily.      Marland Kitchen atorvastatin (LIPITOR) 80 MG tablet Take 80 mg by mouth daily.      Marland Kitchen b complex-vitamin c-folic acid (NEPHRO-VITE) 0.8 MG TABS Take 0.8 mg by mouth at bedtime.      . cloNIDine (CATAPRES - DOSED IN MG/24 HR) 0.2 mg/24hr patch Place 1  patch onto the skin once a week.      . docusate sodium (COLACE) 100 MG capsule Take 100 mg by mouth 2 (two) times daily.      . fish oil-omega-3 fatty acids 1000 MG capsule Take 1 g by mouth daily.      . furosemide (LASIX) 80 MG tablet Take 60 mg by mouth daily.       . hydrALAZINE (APRESOLINE) 10 MG tablet Take 10 mg by mouth 2 (two) times daily.      . insulin aspart (NOVOLOG) 100 UNIT/ML injection Inject 32 Units into the skin 3 (three) times daily before meals.      . insulin glargine (LANTUS) 100 UNIT/ML injection Inject 0.55 mLs (55 Units total) into the skin 2 (two) times daily.  10 mL  0  . linagliptin (TRADJENTA) 5 MG TABS tablet Take 5 mg by mouth daily.      Marland Kitchen loperamide (IMODIUM A-D) 2 MG tablet Take 2 mg by mouth 4 (four) times daily as needed. For diarrhea      . metoprolol (LOPRESSOR) 100 MG tablet Take 100 mg by mouth 2 (two) times daily.      Bertram Gala Glycol-Propyl Glycol (SYSTANE) 0.4-0.3 % SOLN Apply 1 drop to eye daily. Both eyes       No current facility-administered medications on file prior  to visit.    BP 132/83  Pulse 88  Temp(Src) 98.7 F (37.1 C)  Resp 16  SpO2 95%  Physical exam  BP 138/77  Pulse 74  Temp(Src) 97.8 F (36.6 C)  Resp 18  Ht 4\' 4"  (1.321 m)  Wt 224 lb (101.606 kg)  BMI 58.23 kg/m2  SpO2 95%  Constitutional: He is oriented to person, place, and time.  Well built male in NAD   HENT:   Head: Normocephalic and atraumatic.   Mouth/Throat: Oropharynx is clear and moist.   Eyes: Pupils are equal, round, and reactive to light.   Neck: Normal range of motion. Neck supple. No tracheal deviation present.   Cardiovascular: Normal rate and regular rhythm.    Pulmonary/Chest: Effort normal and breath sounds normal.   Abdominal: Soft. Bowel sounds are normal.  Musculoskeletal: Normal range of motion. On a wheelchair B/l BKA  Lymphadenopathy:  He has no cervical adenopathy.  Neurological: He is alert and oriented to person, place, and  time. Sensation intact Skin: Skin is warm and dry. He is not diaphoretic.  Psychiatric: He has a normal mood and affect.   Labs reviewed:  12/31/12 Na 141, k 4.2, bun 34, cr 1.88, ca 9.3, glu 199  1/14 a1c 10.5, t.chol 112, tg 204, hdl 24, ldl 47  04/20/13 Wbc 6.3, hb 10.5, hct 33.3, plt 211, t.chol 100, tg 130, hdl 22, ldl 52, a1c 9.6  Assessment/Plan  DM type 2 uncontrolled cbg reviewed and sugar is better controlled.  Continue current regimen of lantus and nvolog. Continue tradjenta, statin and ASA. Normal urine microalbumin  HTN bp remains stable. Continue amlodipine, clonidine, hydralazine, lasix with metoprolol  Hyperlipidemia Continue statin and fish oil  Labs/tests ordered- a1c, cmp in 2 months

## 2013-07-06 DIAGNOSIS — I10 Essential (primary) hypertension: Secondary | ICD-10-CM | POA: Diagnosis not present

## 2013-07-06 DIAGNOSIS — E119 Type 2 diabetes mellitus without complications: Secondary | ICD-10-CM | POA: Diagnosis not present

## 2013-07-07 DIAGNOSIS — E1139 Type 2 diabetes mellitus with other diabetic ophthalmic complication: Secondary | ICD-10-CM | POA: Diagnosis not present

## 2013-07-07 DIAGNOSIS — E11311 Type 2 diabetes mellitus with unspecified diabetic retinopathy with macular edema: Secondary | ICD-10-CM | POA: Diagnosis not present

## 2013-07-07 DIAGNOSIS — H3581 Retinal edema: Secondary | ICD-10-CM | POA: Diagnosis not present

## 2013-07-07 DIAGNOSIS — H348392 Tributary (branch) retinal vein occlusion, unspecified eye, stable: Secondary | ICD-10-CM | POA: Diagnosis not present

## 2013-07-07 DIAGNOSIS — E119 Type 2 diabetes mellitus without complications: Secondary | ICD-10-CM | POA: Diagnosis not present

## 2013-07-07 DIAGNOSIS — E11359 Type 2 diabetes mellitus with proliferative diabetic retinopathy without macular edema: Secondary | ICD-10-CM | POA: Diagnosis not present

## 2013-07-20 DIAGNOSIS — E119 Type 2 diabetes mellitus without complications: Secondary | ICD-10-CM | POA: Diagnosis not present

## 2013-07-21 ENCOUNTER — Non-Acute Institutional Stay (SKILLED_NURSING_FACILITY): Payer: Medicare Other | Admitting: Internal Medicine

## 2013-07-21 DIAGNOSIS — E1165 Type 2 diabetes mellitus with hyperglycemia: Secondary | ICD-10-CM

## 2013-07-21 DIAGNOSIS — E785 Hyperlipidemia, unspecified: Secondary | ICD-10-CM

## 2013-07-21 DIAGNOSIS — E1129 Type 2 diabetes mellitus with other diabetic kidney complication: Secondary | ICD-10-CM | POA: Diagnosis not present

## 2013-07-21 DIAGNOSIS — I10 Essential (primary) hypertension: Secondary | ICD-10-CM | POA: Diagnosis not present

## 2013-07-21 DIAGNOSIS — IMO0002 Reserved for concepts with insufficient information to code with codable children: Secondary | ICD-10-CM

## 2013-07-21 DIAGNOSIS — N058 Unspecified nephritic syndrome with other morphologic changes: Secondary | ICD-10-CM | POA: Diagnosis not present

## 2013-07-21 NOTE — Progress Notes (Signed)
Patient ID: Marc Wheeler, male   DOB: 1940-12-20, 72 y.o.   MRN: [id number] Location:  [facility] Provider:  [name]. Renato Gails, D.O., C.M.D.  Code Status:  Full code   Chief Complaint  Patient presents with  . Acute Visit    review CBGs    HPI:  73 yo male with poorly controlled DMII with retinopathy, nephropathy and vascular complications, hyperlipidemia, hypertension seen for an acute visit to review his blood sugars.  He has been a bit more compliant recently and values are improved:  90s-200s overall, mostly 100s, few in lower 200s.  Unfortunately, he continues to drink sodas and sweet drinks and salty foods.  He was once again educated about the effects of these foods on his sugar and his blood pressure.  He is currently receiving lantus 55 units bid, tradjenta 5mg  daily, and novolog 32 units tid with meals.    Review of Systems:  Review of Systems  HENT: Negative for congestion.   Eyes: Positive for blurred vision.  Respiratory: Negative for shortness of breath.   Cardiovascular: Positive for leg swelling. Negative for chest pain.  Gastrointestinal: Negative for constipation.  Genitourinary: Negative for dysuria.  Musculoskeletal: Negative for falls and myalgias.  Skin: Negative for rash.  Neurological: Negative for dizziness and weakness.  Endo/Heme/Allergies:       Diabetes  Psychiatric/Behavioral: Negative for memory loss.    Medications: Patient's Medications  New Prescriptions   No medications on file  Previous Medications   ACETAMINOPHEN (TYLENOL) 325 MG TABLET    Take 650 mg by mouth every 4 (four) hours as needed for pain.   AMLODIPINE (NORVASC) 10 MG TABLET    Take 10 mg by mouth daily.   ASPIRIN EC 81 MG TABLET    Take 81 mg by mouth daily.   ATORVASTATIN (LIPITOR) 80 MG TABLET    Take 80 mg by mouth daily.   B COMPLEX-VITAMIN C-FOLIC ACID (NEPHRO-VITE) 0.8 MG TABS    Take 0.8 mg by mouth at bedtime.   CALCIUM CARB-CHOLECALCIFEROL (CALCIUM 500  +D) 500-400 MG-UNIT TABS    Take 1 tablet by mouth 2 (two) times daily.   CLONIDINE (CATAPRES - DOSED IN MG/24 HR) 0.2 MG/24HR PATCH    Place 1 patch onto the skin once a week.   DICLOFENAC SODIUM (VOLTAREN) 1 % GEL    Apply 2 g topically 4 (four) times daily as needed (shoulder pain).   DOCUSATE SODIUM (COLACE) 100 MG CAPSULE    Take 100 mg by mouth 2 (two) times daily.   FISH OIL-OMEGA-3 FATTY ACIDS 1000 MG CAPSULE    Take 1 g by mouth daily.   FUROSEMIDE (LASIX) 80 MG TABLET    Take 60 mg by mouth daily.    HYDRALAZINE (APRESOLINE) 10 MG TABLET    Take 10 mg by mouth 2 (two) times daily.   INSULIN ASPART (NOVOLOG) 100 UNIT/ML INJECTION    Inject 32 Units into the skin 3 (three) times daily before meals.   INSULIN GLARGINE (LANTUS) 100 UNIT/ML INJECTION    Inject 0.55 mLs (55 Units total) into the skin 2 (two) times daily.   LINAGLIPTIN (TRADJENTA) 5 MG TABS TABLET    Take 5 mg by mouth daily.   LOPERAMIDE (IMODIUM A-D) 2 MG TABLET    Take 2 mg by mouth 4 (four) times daily as needed. For diarrhea   METOPROLOL (LOPRESSOR) 100 MG TABLET    Take 100 mg by mouth 2 (two) times daily.  POLYETHYL GLYCOL-PROPYL GLYCOL (SYSTANE) 0.4-0.3 % SOLN    Apply 1 drop to eye daily. Both eyes  Modified Medications   No medications on file  Discontinued Medications   No medications on file    Physical Exam: Filed Vitals:   07/21/13 1027  BP: 145/62  Pulse: 68  Temp: 97.7 F (36.5 C)  Resp: 18  Height: 4\' 4"  (1.321 m)  Weight: 231 lb (104.781 kg)  SpO2: 95%   Physical Exam  Constitutional: He is oriented to person, place, and time. He appears well-developed and well-nourished. No distress.  Black male seated in his wheelchair  HENT:  Head: Normocephalic and atraumatic.  Neck: Neck supple. No JVD present.  Cardiovascular: Normal rate, regular rhythm and normal heart sounds.   Edema present bilateral stumps  Pulmonary/Chest: Effort normal and breath sounds normal. No respiratory distress. He has  no rales.  Abdominal: Soft. Bowel sounds are normal. He exhibits no distension and no mass. There is no tenderness.  Musculoskeletal: Normal range of motion.  Bilateral amputee  Neurological: He is alert and oriented to person, place, and time.  Skin: Skin is warm and dry.     Labs reviewed: 07/20/13:  hba1c 8.7  Assessment/Plan 1. DM (diabetes mellitus), type 2, uncontrolled, with renal complications -educated about this as above -insulin and oral agents not changed today--has had improvement in his glucose readings recently--encouraged to make the appropriate dietary changes to help himself  2. Essential hypertension, benign -bp above goal today--has been drinking soda and eating salty food -again was counseled about the detrimental effects of this behavior and explained that his edema of his legs is partly due to this -encouraged him to replace some of his sodas with water to gradually taper himself off  3. Hyperlipidemia LDL goal <70 -cont on lipitor high dose and fish oil -dietary changes encouraged  Family/ staff Communication: pt was counseled in detail as in hpi--he is his own decision-maker  Goals of care: full code, long term care resident  Labs/tests ordered:  hba1c in 3 mos

## 2013-07-27 DIAGNOSIS — E119 Type 2 diabetes mellitus without complications: Secondary | ICD-10-CM | POA: Diagnosis not present

## 2013-08-05 ENCOUNTER — Non-Acute Institutional Stay (SKILLED_NURSING_FACILITY): Payer: Medicare Other | Admitting: Adult Health

## 2013-08-05 DIAGNOSIS — E1129 Type 2 diabetes mellitus with other diabetic kidney complication: Secondary | ICD-10-CM

## 2013-08-05 DIAGNOSIS — R609 Edema, unspecified: Secondary | ICD-10-CM

## 2013-08-05 DIAGNOSIS — I2510 Atherosclerotic heart disease of native coronary artery without angina pectoris: Secondary | ICD-10-CM

## 2013-08-05 DIAGNOSIS — K5900 Constipation, unspecified: Secondary | ICD-10-CM

## 2013-08-05 DIAGNOSIS — E785 Hyperlipidemia, unspecified: Secondary | ICD-10-CM

## 2013-08-05 DIAGNOSIS — I10 Essential (primary) hypertension: Secondary | ICD-10-CM

## 2013-08-05 DIAGNOSIS — N058 Unspecified nephritic syndrome with other morphologic changes: Secondary | ICD-10-CM

## 2013-08-06 ENCOUNTER — Encounter: Payer: Self-pay | Admitting: Adult Health

## 2013-08-06 NOTE — Assessment & Plan Note (Signed)
His blood pressure is stable will continue his norvasc 10 mg daily clonidine 0.2 mg weekly patch; lopressor 100 mg twice daily; and hydralazine 10 mg twice daily and will continue to monitor his status

## 2013-08-06 NOTE — Assessment & Plan Note (Signed)
His status is slightly better; his hgb a1c is now at 9.2 down slightly from 9.6. Will continue lantus 55 units twice daily novolog 32 units with meals; and tradjenta 5 mg daily and will monitor

## 2013-08-06 NOTE — Assessment & Plan Note (Signed)
His ldl is at goal at 1; will continue his lipitor 80 mg daily and fish oil 1 gm daily and will monitor

## 2013-08-06 NOTE — Assessment & Plan Note (Signed)
He is stable will continue lasix 60 mg daily and will monitor his status he does have renal insufficiency

## 2013-08-06 NOTE — Assessment & Plan Note (Signed)
Will continue colace twice daily 

## 2013-08-06 NOTE — Progress Notes (Signed)
Patient ID: Marc Wheeler, male   DOB: 04/29/1941, 72 y.o.   MRN: [id number]  [facility]  No Known Allergies   Chief Complaint  Patient presents with  . Medical Managment of Chronic Issues    HPI: He is being seen for the management of his chronic illnesses. His hgb a1c has slightly improved from 9.6 to 9.2. He continues to have difficulty adhering to a low carbohydrate diet. There are no concerns being voiced by the nursing staff at this time. He tells me that he is feeling good today.   Past Medical History  Diagnosis Date  . Diabetes mellitus without complication   . Hyperlipidemia   . Hypertension   . Chronic kidney disease   . Edema   . Depression   . Diabetic retinopathy     Past Surgical History  Procedure Laterality Date  . Leg amputation below knee Bilateral   . Right retinal detachment repair  1 22 14     VITAL SIGNS BP 124/83  Pulse 72  Ht 4\' 4"  (1.321 m)  Wt 230 lb (104.327 kg)  BMI 59.78 kg/m2   Patient's Medications  New Prescriptions   No medications on file  Previous Medications   ACETAMINOPHEN (TYLENOL) 325 MG TABLET    Take 650 mg by mouth every 4 (four) hours as needed for pain.   AMLODIPINE (NORVASC) 10 MG TABLET    Take 10 mg by mouth daily.   ASPIRIN EC 81 MG TABLET    Take 81 mg by mouth daily.   ATORVASTATIN (LIPITOR) 80 MG TABLET    Take 80 mg by mouth daily.   B COMPLEX-VITAMIN C-FOLIC ACID (NEPHRO-VITE) 0.8 MG TABS    Take 0.8 mg by mouth at bedtime.   CALCIUM CARB-CHOLECALCIFEROL (CALCIUM 500 +D) 500-400 MG-UNIT TABS    Take 1 tablet by mouth 2 (two) times daily.   CLONIDINE (CATAPRES - DOSED IN MG/24 HR) 0.2 MG/24HR PATCH    Place 1 patch onto the skin once a week.   DOCUSATE SODIUM (COLACE) 100 MG CAPSULE    Take 100 mg by mouth 2 (two) times daily.   FISH OIL-OMEGA-3 FATTY ACIDS 1000 MG CAPSULE    Take 1 g by mouth daily.   FUROSEMIDE (LASIX) 80 MG TABLET    Take 60 mg by mouth daily.    HYDRALAZINE (APRESOLINE) 10 MG TABLET    Take  10 mg by mouth 2 (two) times daily.   INSULIN ASPART (NOVOLOG) 100 UNIT/ML INJECTION    Inject 32 Units into the skin 3 (three) times daily before meals.   INSULIN GLARGINE (LANTUS) 100 UNIT/ML INJECTION    Inject 0.55 mLs (55 Units total) into the skin 2 (two) times daily.   LINAGLIPTIN (TRADJENTA) 5 MG TABS TABLET    Take 5 mg by mouth daily.   LOPERAMIDE (IMODIUM A-D) 2 MG TABLET    Take 2 mg by mouth 4 (four) times daily as needed. For diarrhea   METOPROLOL (LOPRESSOR) 100 MG TABLET    Take 100 mg by mouth 2 (two) times daily.   POLYETHYL GLYCOL-PROPYL GLYCOL (SYSTANE) 0.4-0.3 % SOLN    Apply 1 drop to eye daily. Both eyes  Modified Medications   No medications on file  Discontinued Medications   No medications on file    SIGNIFICANT DIAGNOSTIC EXAMS    LABS REVIEWED:  04-20-13: wbc 6.3; hgb 10.5; hct 33.3; mcv 81.0; plt 211; chol 100; ldl 52; trig 130; hgb a1c 9.6 06-05-13: micro albumin 5.06  07-06-13: glucose 132; bun 35; creat 2.21; k+4.2; na++143; liver normal albumin 4.2; hgb a1c 9.2    Review of Systems  Constitutional: Negative for weight loss and malaise/fatigue.  Respiratory: Negative for cough and shortness of breath.   Cardiovascular: Negative for chest pain.  Gastrointestinal: Negative for heartburn, abdominal pain and constipation.  Musculoskeletal: Negative for myalgias.  Skin: Negative.   Neurological: Negative for headaches.  Psychiatric/Behavioral: Negative for depression. The patient is not nervous/anxious.     Physical Exam  Constitutional: He is oriented to person, place, and time. He appears well-developed and well-nourished.  obese  Neck: Neck supple. No JVD present.  Cardiovascular: Normal rate and regular rhythm.   Respiratory: Effort normal and breath sounds normal. No respiratory distress.  GI: Soft. Bowel sounds are normal. There is no tenderness.  Musculoskeletal:  Bilateral bka; full range of motion to upper extremities; no edema to stumps  present.   Neurological: He is alert and oriented to person, place, and time.  Skin: Skin is warm and dry.  Psychiatric: He has a normal mood and affect.     ASSESSMENT/ PLAN:  Essential hypertension, benign His blood pressure is stable will continue his norvasc 10 mg daily clonidine 0.2 mg weekly patch; lopressor 100 mg twice daily; and hydralazine 10 mg twice daily and will continue to monitor his status   CAD (coronary artery disease) He is presently stable there are no complaints of chest pain present; will continue asa 81 mg daily and will monitor   Constipation Will continue colace twice daily   DM (diabetes mellitus), type 2, uncontrolled, with renal complications His status is slightly better; his hgb a1c is now at 9.2 down slightly from 9.6. Will continue lantus 55 units twice daily novolog 32 units with meals; and tradjenta 5 mg daily and will monitor   Other and unspecified hyperlipidemia His ldl is at goal at 52; will continue his lipitor 80 mg daily and fish oil 1 gm daily and will monitor   Edema He is stable will continue lasix 60 mg daily and will monitor his status he does have renal insufficiency

## 2013-08-06 NOTE — Assessment & Plan Note (Signed)
He is presently stable there are no complaints of chest pain present; will continue asa 81 mg daily and will monitor

## 2013-08-31 NOTE — Progress Notes (Signed)
Patient ID: Marc Wheeler, male   DOB: [birth year], 72 y.o.   MRN: [id number]  [facility]  No Known Allergies  Chief Complaint  Patient presents with  . Medical Managment of Chronic Issues    HPI  He is being seen for the management of his chronic illnesses. There are no concerns being voiced by the nursing staff at this time. He is not voicing any concerns at this time. Overall his status remains without significant change   Past Medical History  Diagnosis Date  . Diabetes mellitus without complication   . Hyperlipidemia   . Hypertension   . Chronic kidney disease   . Edema   . Depression   . Diabetic retinopathy     Past Surgical History  Procedure Laterality Date  . Leg amputation below knee Bilateral   . Right retinal detachment repair  1 22 14     Filed Vitals:   01/21/13 1550  BP: 133/80  Pulse: 75  Height: 4\' 4"  (1.321 m)  Weight: 217 lb (98.431 kg)    Medications: Patient's Medications   New Prescriptions     No medications on file   Previous Medications     ACETAMINOPHEN (TYLENOL) 325 MG TABLET     Take 650 mg by mouth every 4 (four) hours as needed for pain.     AMLODIPINE (NORVASC) 10 MG TABLET     Take 10 mg by mouth daily.     ASPIRIN EC 81 MG TABLET     Take 81 mg by mouth daily.     ATORVASTATIN (LIPITOR) 80 MG TABLET     Take 80 mg by mouth daily.     B COMPLEX-VITAMIN C-FOLIC ACID (NEPHRO-VITE) 0.8 MG TABS     Take 0.8 mg by mouth at bedtime.     CLONIDINE (CATAPRES - DOSED IN MG/24 HR) 0.2 MG/24HR PATCH     Place 1 patch onto the skin once a week.     DOCUSATE SODIUM (COLACE) 100 MG CAPSULE     Take 100 mg by mouth 2 (two) times daily.     FISH OIL-OMEGA-3 FATTY ACIDS 1000 MG CAPSULE     Take 1 g by mouth daily.     FUROSEMIDE (LASIX) 80 MG TABLET     Take 60 mg by mouth daily.      HYDRALAZINE (APRESOLINE) 10 MG TABLET     Take 10 mg by mouth 2 (two) times daily.     INSULIN ASPART (NOVOLOG) 100 UNIT/ML INJECTION     Inject 25 Units into the  skin 3 (three) times daily before meals. 25 units prior to meals with an additional 5 units for cbg >=150     INSULIN GLARGINE (LANTUS) 100 UNIT/ML INJECTION     Inject 100 Units into the skin at bedtime.      LINAGLIPTIN (TRADJENTA) 5 MG TABS TABLET     Take 5 mg by mouth daily.     LOPERAMIDE (IMODIUM A-D) 2 MG TABLET     Take 2 mg by mouth 4 (four) times daily as needed. For diarrhea     METOPROLOL (LOPRESSOR) 100 MG TABLET     Take 100 mg by mouth 2 (two) times daily.     POLYETHYL GLYCOL-PROPYL GLYCOL (SYSTANE) 0.4-0.3 % SOLN     Apply 1 drop to eye daily. Both eyes   Modified Medications     No medications on file       Labs reviewed: 11-04-12: chol 112; ldl 47;trig 204;  hgb a1c 10.5  12-31-12: glucose  199; bun 34; creat 1.88; k+4.2; na++ 141     Review of Systems  Constitutional: Negative for weight loss and malaise/fatigue.  Respiratory: Negative for cough and shortness of breath.  Cardiovascular: Negative for chest pain.  Gastrointestinal: Negative for heartburn, abdominal pain and constipation.  Musculoskeletal: Negative for myalgias.  Skin: Negative.  Neurological: Negative for headaches.  Psychiatric/Behavioral: Negative for depression. The patient is not nervous/anxious.     Physical Exam  Constitutional: He is oriented to person, place, and time. He appears well-developed and well-nourished.  obese  Neck: Neck supple. No JVD present.  Cardiovascular: Normal rate and regular rhythm.  Respiratory: Effort normal and breath sounds normal. No respiratory distress.  GI: Soft. Bowel sounds are normal. There is no tenderness.  Musculoskeletal:  Bilateral bka; full range of motion to upper extremities; no edema to stumps present.  Neurological: He is alert and oriented to person, place, and time.  Skin: Skin is warm and dry.  Psychiatric: He has a normal mood and affect.    ASSESSMENT/PLAN  1. Hypertension: is stable will continue norvasc 10 mg daily; clonidine patch  0.2 mg weekly; hydralazine 10 mg twice daily and lopressor 100 mg twice daily and asa 81 mg daily  2. Diabetes: is without change he does eat foods he should be avoiding; will continue lantus 100 units daily; novolog 25 units three times daily with an additional 5 units for cbg >=150; will continue tradjenta 5 mg daily will monitor   3. Edema; will continue lasix 60 mg daily  4. Dyslipidemia: continue lipitor 80 mg daily; and fish oil 1 gm daily  5. Cad: no complaints of chest pain will continue asa 81 mg daily  6. Constipation: will continue colace twice daily

## 2013-09-09 ENCOUNTER — Non-Acute Institutional Stay (SKILLED_NURSING_FACILITY): Payer: Medicare Other | Admitting: Adult Health

## 2013-09-09 DIAGNOSIS — R609 Edema, unspecified: Secondary | ICD-10-CM

## 2013-09-09 DIAGNOSIS — E785 Hyperlipidemia, unspecified: Secondary | ICD-10-CM

## 2013-09-09 DIAGNOSIS — I10 Essential (primary) hypertension: Secondary | ICD-10-CM

## 2013-09-09 DIAGNOSIS — E1129 Type 2 diabetes mellitus with other diabetic kidney complication: Secondary | ICD-10-CM | POA: Diagnosis not present

## 2013-09-09 DIAGNOSIS — M25519 Pain in unspecified shoulder: Secondary | ICD-10-CM

## 2013-09-09 DIAGNOSIS — N058 Unspecified nephritic syndrome with other morphologic changes: Secondary | ICD-10-CM

## 2013-09-09 DIAGNOSIS — K5900 Constipation, unspecified: Secondary | ICD-10-CM

## 2013-09-09 DIAGNOSIS — I2510 Atherosclerotic heart disease of native coronary artery without angina pectoris: Secondary | ICD-10-CM | POA: Diagnosis not present

## 2013-09-09 DIAGNOSIS — M25512 Pain in left shoulder: Secondary | ICD-10-CM | POA: Insufficient documentation

## 2013-09-10 ENCOUNTER — Encounter: Payer: Self-pay | Admitting: Adult Health

## 2013-09-10 NOTE — Progress Notes (Signed)
Patient ID: Marc Wheeler, male   DOB: 03-Sep-1941, 72 y.o.   MRN: [id number]     [facility]  No Known Allergies   Chief Complaint  Patient presents with  . Medical Managment of Chronic Issues    HPI:  He is being seen for the management of his chronic illnesses. There are no concerns being voiced by the nursing staff at this time. He is complaining left shoulder pain. He states he is experiencing decreased mobility and increased pain. He is also taking his insulin in his arms. I have told him to stop taking any insulin in his arms but use his stomach and thighs; he stated he would start doing so; the nursing staff they would encourage this as well.   Past Medical History  Diagnosis Date  . Diabetes mellitus without complication   . Hyperlipidemia   . Hypertension   . Chronic kidney disease   . Edema   . Depression   . Diabetic retinopathy     Past Surgical History  Procedure Laterality Date  . Leg amputation below knee Bilateral   . Right retinal detachment repair  1 22 14     VITAL SIGNS BP 142/75  Pulse 72  Ht 4\' 4"  (1.321 m)  Wt 234 lb (106.142 kg)  BMI 60.82 kg/m2   Patient's Medications  New Prescriptions   No medications on file  Previous Medications   ACETAMINOPHEN (TYLENOL) 325 MG TABLET    Take 650 mg by mouth every 4 (four) hours as needed for pain.   AMLODIPINE (NORVASC) 10 MG TABLET    Take 10 mg by mouth daily.   ASPIRIN EC 81 MG TABLET    Take 81 mg by mouth daily.   ATORVASTATIN (LIPITOR) 80 MG TABLET    Take 80 mg by mouth daily.   B COMPLEX-VITAMIN C-FOLIC ACID (NEPHRO-VITE) 0.8 MG TABS    Take 0.8 mg by mouth at bedtime.   CALCIUM CARB-CHOLECALCIFEROL (CALCIUM 500 +D) 500-400 MG-UNIT TABS    Take 1 tablet by mouth 2 (two) times daily.   CLONIDINE (CATAPRES - DOSED IN MG/24 HR) 0.2 MG/24HR PATCH    Place 1 patch onto the skin once a week.   DOCUSATE SODIUM (COLACE) 100 MG CAPSULE    Take 100 mg by mouth 2 (two) times daily.   FISH OIL-OMEGA-3  FATTY ACIDS 1000 MG CAPSULE    Take 1 g by mouth daily.   FUROSEMIDE (LASIX) 80 MG TABLET    Take 60 mg by mouth daily.    HYDRALAZINE (APRESOLINE) 10 MG TABLET    Take 10 mg by mouth 2 (two) times daily.   INSULIN ASPART (NOVOLOG) 100 UNIT/ML INJECTION    Inject 32 Units into the skin 3 (three) times daily before meals.   INSULIN GLARGINE (LANTUS) 100 UNIT/ML INJECTION    Inject 0.55 mLs (55 Units total) into the skin 2 (two) times daily.   LINAGLIPTIN (TRADJENTA) 5 MG TABS TABLET    Take 5 mg by mouth daily.   LOPERAMIDE (IMODIUM A-D) 2 MG TABLET    Take 2 mg by mouth 4 (four) times daily as needed. For diarrhea   METOPROLOL (LOPRESSOR) 100 MG TABLET    Take 100 mg by mouth 2 (two) times daily.   POLYETHYL GLYCOL-PROPYL GLYCOL (SYSTANE) 0.4-0.3 % SOLN    Apply 1 drop to eye daily. Both eyes  Modified Medications   No medications on file  Discontinued Medications   No medications on file  SIGNIFICANT DIAGNOSTIC EXAMS   LABS REVIEWED:  04-20-13: wbc 6.3; hgb 10.5; hct 33.3; mcv 81.0; plt 211; chol 100; ldl 52; trig 130; hgb a1c 9.6 06-05-13: micro albumin 5.06 07-06-13: glucose 132; bun 35; creat 2.21; k+4.2; na++143; liver normal albumin 4.2; hgb a1c 9.2    Review of Systems  Constitutional: Negative for weight loss and malaise/fatigue.  Respiratory: Negative for cough and shortness of breath.   Cardiovascular: Negative for chest pain.  Gastrointestinal: Negative for heartburn, abdominal pain and constipation.  Musculoskeletal: Negative for myalgias. DOES HAVE LEFT SHOULDER PAIN. AND DECEREASED MOBILITY  Skin: Negative.   Neurological: Negative for headaches.  Psychiatric/Behavioral: Negative for depression. The patient is not nervous/anxious.     Physical Exam  Constitutional: He is oriented to person, place, and time. He appears well-developed and well-nourished.  obese  Neck: Neck supple. No JVD present.  Cardiovascular: Normal rate and regular rhythm.   Respiratory: Effort  normal and breath sounds normal. No respiratory distress.  GI: Soft. Bowel sounds are normal. There is no tenderness.  Musculoskeletal:  Bilateral bka; full range of motion to right upper extremity; his left shoulder has decreased range of motion present and decreased strength present;  no edema to stumps present.   Neurological: He is alert and oriented to person, place, and time.  Skin: Skin is warm and dry.  Psychiatric: He has a normal mood and affect.     ASSESSMENT/ PLAN:  Essential hypertension, benign His blood pressure is stable will continue his norvasc 10 mg daily clonidine 0.2 mg weekly patch; lopressor 100 mg twice daily; and hydralazine 10 mg twice daily and will continue to monitor his status   CAD (coronary artery disease) He is presently stable there are no complaints of chest pain present; will continue asa 81 mg daily and will monitor   Constipation Will continue colace twice daily   DM (diabetes mellitus), type 2, uncontrolled, with renal complications His status is slightly better; his hgb a1c is now at 9.2 down slightly from 9.6. Will continue lantus 55 units twice daily novolog 32 units with meals; and tradjenta 5 mg daily and will monitor   Other and unspecified hyperlipidemia His ldl is at goal at 52; will continue his lipitor 80 mg daily and fish oil 1 gm daily and will monitor   Edema He is stable will continue lasix 60 mg daily and will monitor his status he does have renal insufficiency  Left shoulder pain Will use voltaren gel to left shoulder 2 gm four times daily and will have OT eval and treat as indicated.

## 2013-09-23 DIAGNOSIS — E1165 Type 2 diabetes mellitus with hyperglycemia: Secondary | ICD-10-CM | POA: Diagnosis not present

## 2013-09-23 DIAGNOSIS — R809 Proteinuria, unspecified: Secondary | ICD-10-CM | POA: Diagnosis not present

## 2013-09-23 DIAGNOSIS — D631 Anemia in chronic kidney disease: Secondary | ICD-10-CM | POA: Diagnosis not present

## 2013-09-23 DIAGNOSIS — I129 Hypertensive chronic kidney disease with stage 1 through stage 4 chronic kidney disease, or unspecified chronic kidney disease: Secondary | ICD-10-CM | POA: Diagnosis not present

## 2013-09-23 DIAGNOSIS — N039 Chronic nephritic syndrome with unspecified morphologic changes: Secondary | ICD-10-CM | POA: Diagnosis not present

## 2013-09-23 DIAGNOSIS — E1129 Type 2 diabetes mellitus with other diabetic kidney complication: Secondary | ICD-10-CM | POA: Diagnosis not present

## 2013-09-23 DIAGNOSIS — N183 Chronic kidney disease, stage 3 unspecified: Secondary | ICD-10-CM | POA: Diagnosis not present

## 2013-09-23 DIAGNOSIS — N2581 Secondary hyperparathyroidism of renal origin: Secondary | ICD-10-CM | POA: Diagnosis not present

## 2013-09-25 DIAGNOSIS — R809 Proteinuria, unspecified: Secondary | ICD-10-CM | POA: Diagnosis not present

## 2013-09-29 ENCOUNTER — Other Ambulatory Visit: Payer: Self-pay | Admitting: Nephrology

## 2013-10-01 ENCOUNTER — Ambulatory Visit
Admission: RE | Admit: 2013-10-01 | Discharge: 2013-10-01 | Disposition: A | Discharge status: Home / Self Care | Payer: Medicare Other | Source: Ambulatory Visit | Attending: Nephrology | Admitting: Nephrology

## 2013-10-01 DIAGNOSIS — I10 Essential (primary) hypertension: Secondary | ICD-10-CM | POA: Diagnosis not present

## 2013-10-05 ENCOUNTER — Non-Acute Institutional Stay (SKILLED_NURSING_FACILITY): Payer: Medicare Other | Admitting: Internal Medicine

## 2013-10-05 ENCOUNTER — Encounter: Payer: Self-pay | Admitting: Internal Medicine

## 2013-10-05 DIAGNOSIS — E1129 Type 2 diabetes mellitus with other diabetic kidney complication: Secondary | ICD-10-CM

## 2013-10-05 DIAGNOSIS — M1990 Unspecified osteoarthritis, unspecified site: Secondary | ICD-10-CM

## 2013-10-05 DIAGNOSIS — E785 Hyperlipidemia, unspecified: Secondary | ICD-10-CM | POA: Diagnosis not present

## 2013-10-05 DIAGNOSIS — E1165 Type 2 diabetes mellitus with hyperglycemia: Secondary | ICD-10-CM

## 2013-10-05 DIAGNOSIS — N058 Unspecified nephritic syndrome with other morphologic changes: Secondary | ICD-10-CM

## 2013-10-05 DIAGNOSIS — I10 Essential (primary) hypertension: Secondary | ICD-10-CM

## 2013-10-05 NOTE — Progress Notes (Signed)
Patient ID: Marc Wheeler, male   DOB: 1940/11/10, 72 y.o.   MRN: [id number]    Marc Wheeler living [employer]  Chief Complaint  Patient presents with  . Medical Managment of Chronic Issues   No Known Allergies  HPI 72 y/o male pt here for long term care is seen today in his room. He is at his baseline. No new concern from patient, nursing staff. His sugars have been better controlled. He had a renal usg which was normal. No falls, skin concern reported.   Review of Systems  Constitutional: Negative for fever, chills, weight loss, malaise/fatigue and diaphoresis.  HENT: Negative for congestion, hearing loss and sore throat.   Eyes: Negative for blurred vision, double vision and discharge.  Respiratory: Negative for cough, sputum production, shortness of breath and wheezing.   Cardiovascular: Negative for chest pain, palpitations, orthopnea and leg swelling.  Gastrointestinal: Negative for heartburn, nausea, vomiting, abdominal pain, diarrhea and constipation.  Genitourinary: Negative for dysuria, urgency, frequency and flank pain.  Musculoskeletal: Negative for back pain, falls, myalgias.  Skin: Negative for itching and rash.  Neurological: Negative for dizziness, tingling, focal weakness and headaches.  Psychiatric/Behavioral: Negative for depression and memory loss. The patient is not nervous/anxious.    Past Medical History  Diagnosis Date  . Diabetes mellitus without complication   . Hyperlipidemia   . Hypertension   . Chronic kidney disease   . Edema   . Depression   . Diabetic retinopathy    Medication reviewed. See [facility]  Physical exam BP 135/75  Pulse 71  Temp(Src) 97.9 F (36.6 C)  Resp 18  Ht 4\' 4"  (1.321 m)  Wt 234 lb (106.142 kg)  BMI 60.82 kg/m2  SpO2 98%  Constitutional: He is oriented to person, place, and time. He appears well-developed and well-nourished.  obese   Neck: Neck supple. No JVD present.   Cardiovascular: Normal rate and regular rhythm.      Respiratory: Effort normal and breath sounds normal. No respiratory distress.   GI: Soft. Bowel sounds are normal. There is no tenderness.  Musculoskeletal:  Bilateral bka, normal ROM in upper extremity, trace edema to thighs  Neurological: He is alert and oriented to person, place, and time.   Skin: Skin is warm and dry.  Psychiatric: He has a normal mood and affect.    LABS REVIEWED:   04-20-13: wbc 6.3; hgb 10.5; hct 33.3; mcv 81.0; plt 211; chol 100; ldl 52; trig 130; hgb a1c 9.6 06-05-13: micro albumin 5.06 07-06-13: glucose 132; bun 35; creat 2.21; k+4.2; na++143; liver normal albumin 4.2; hgb a1c 9.2   07-27-13 a1c 8.7  ASSESSMENT/ PLAN:  Essential hypertension, benign bp well controlled. continue his norvasc 10 mg daily, clonidine 0.2 mg weekly patch, lopressor 100 mg twice daily, hydralazine 10 mg twice daily and furosemide 60 mg daily. Monitor renal function and bp reading  DM type 2, uncontrolled, with renal complications a1c 8.7- improved. Continue lantus 55 units twice daily, novolog 32 units with meals,tradjenta 5 mg daily and will monitor cbg. Reinforced dietary compliance. Continue statin and asa  Osteoarthritis Continue calcium-vit d with prn tylenol which has been helpful at present.  Other and unspecified hyperlipidemia continue his lipitor 80 mg daily and fish oil 1 gm daily and will monitor

## 2013-10-12 ENCOUNTER — Encounter: Payer: Self-pay | Admitting: Internal Medicine

## 2013-10-13 ENCOUNTER — Encounter: Payer: Self-pay | Admitting: Internal Medicine

## 2013-10-13 ENCOUNTER — Non-Acute Institutional Stay (SKILLED_NURSING_FACILITY): Payer: Medicare Other | Admitting: Internal Medicine

## 2013-10-13 DIAGNOSIS — M25519 Pain in unspecified shoulder: Secondary | ICD-10-CM | POA: Diagnosis not present

## 2013-10-13 DIAGNOSIS — M1990 Unspecified osteoarthritis, unspecified site: Secondary | ICD-10-CM | POA: Diagnosis not present

## 2013-10-13 DIAGNOSIS — E1129 Type 2 diabetes mellitus with other diabetic kidney complication: Secondary | ICD-10-CM | POA: Diagnosis not present

## 2013-10-13 DIAGNOSIS — M25512 Pain in left shoulder: Secondary | ICD-10-CM

## 2013-10-13 DIAGNOSIS — E1165 Type 2 diabetes mellitus with hyperglycemia: Secondary | ICD-10-CM

## 2013-10-13 DIAGNOSIS — IMO0002 Reserved for concepts with insufficient information to code with codable children: Secondary | ICD-10-CM

## 2013-10-13 NOTE — Progress Notes (Signed)
Patient ID: Marc Wheeler, male   DOB: 1941-04-18, 73 y.o.   MRN: [id number]  Location:  [facility] Provider:  [name]. Renato Gails, D.O., C.M.D.  Code Status:  Full code   Chief Complaint  Patient presents with  . Acute Visit    wants voltaren gel prn    HPI:  72 yo male with DMII, uncontrolled with CKDIV, htn, hyperlipidemia, PAD was seen for aV due to concerns about his voltaren gel for his left shoulder pain--he wants this prn instead of scheduled.  He is also due to for his regular labs for renal function and hba1c in another week.  Review of Systems:  Review of Systems  Constitutional: Negative for fever.  HENT: Negative for congestion.   Eyes: Positive for blurred vision.  Cardiovascular: Negative for chest pain and palpitations.  Gastrointestinal: Negative for constipation, blood in stool and melena.  Genitourinary: Negative for flank pain.  Musculoskeletal: Positive for joint pain.  Skin: Negative for rash.  Neurological: Positive for sensory change.  Endo/Heme/Allergies: Does not bruise/bleed easily.  Psychiatric/Behavioral: Negative for depression and memory loss.    Medications: Patient's Medications  New Prescriptions   No medications on file  Previous Medications   ACETAMINOPHEN (TYLENOL) 325 MG TABLET    Take 650 mg by mouth every 4 (four) hours as needed for pain.   AMLODIPINE (NORVASC) 10 MG TABLET    Take 10 mg by mouth daily.   ASPIRIN EC 81 MG TABLET    Take 81 mg by mouth daily.   ATORVASTATIN (LIPITOR) 80 MG TABLET    Take 80 mg by mouth daily.   B COMPLEX-VITAMIN C-FOLIC ACID (NEPHRO-VITE) 0.8 MG TABS    Take 0.8 mg by mouth at bedtime.   CALCIUM CARB-CHOLECALCIFEROL (CALCIUM 500 +D) 500-400 MG-UNIT TABS    Take 1 tablet by mouth 2 (two) times daily.   CLONIDINE (CATAPRES - DOSED IN MG/24 HR) 0.2 MG/24HR PATCH    Place 1 patch onto the skin once a week.   DICLOFENAC SODIUM (VOLTAREN) 1 % GEL    Apply 2 g topically 4 (four) times daily as  needed (shoulder pain).   DOCUSATE SODIUM (COLACE) 100 MG CAPSULE    Take 100 mg by mouth 2 (two) times daily.   FISH OIL-OMEGA-3 FATTY ACIDS 1000 MG CAPSULE    Take 1 g by mouth daily.   FUROSEMIDE (LASIX) 80 MG TABLET    Take 60 mg by mouth daily.    HYDRALAZINE (APRESOLINE) 10 MG TABLET    Take 10 mg by mouth 2 (two) times daily.   INSULIN ASPART (NOVOLOG) 100 UNIT/ML INJECTION    Inject 32 Units into the skin 3 (three) times daily before meals.   INSULIN GLARGINE (LANTUS) 100 UNIT/ML INJECTION    Inject 0.55 mLs (55 Units total) into the skin 2 (two) times daily.   LINAGLIPTIN (TRADJENTA) 5 MG TABS TABLET    Take 5 mg by mouth daily.   LOPERAMIDE (IMODIUM A-D) 2 MG TABLET    Take 2 mg by mouth 4 (four) times daily as needed. For diarrhea   METOPROLOL (LOPRESSOR) 100 MG TABLET    Take 100 mg by mouth 2 (two) times daily.   POLYETHYL GLYCOL-PROPYL GLYCOL (SYSTANE) 0.4-0.3 % SOLN    Apply 1 drop to eye daily. Both eyes  Modified Medications   No medications on file  Discontinued Medications   No medications on file    Physical Exam: Filed Vitals:   10/13/13 1108  BP: 139/75  Pulse: 66  Temp: 97.3 F (36.3 C)  Resp: 19  Height: 4\' 4"  (1.321 m)  Weight: 234 lb (106.142 kg)  SpO2: 98%  Physical Exam  Constitutional: He is oriented to person, place, and time. He appears well-developed and well-nourished. No distress.  Cardiovascular: Normal rate, regular rhythm and normal heart sounds.   Pulmonary/Chest: Effort normal and breath sounds normal. He has no rales.  Abdominal: Soft. Bowel sounds are normal. He exhibits no distension and no mass. There is no tenderness.  Musculoskeletal: Normal range of motion.  Bilateral BKA  Neurological: He is alert and oriented to person, place, and time.  Skin: Skin is warm and dry.  Psychiatric: He has a normal mood and affect.    Labs reviewed: Last 9/29  Assessment/Plan 1. Left shoulder pain -arthritis and may have a component of rotator  cuff tendonitis, as well -will change voltaren gel to prn as per his request -also cont prn tylenol  2. Osteoarthritis -cont tylenol and voltaren gel  3. DM (diabetes mellitus), type 2, uncontrolled, with renal complications -has been very poorly controlled -also has vascular complications with prior bkas -improving control with lantus 55 units bid and novolog 32 units ac meals plus tradjenta 5 mg daily  Family/ staff Communication: seen with unit supervisor  Goals of care: full code--need to discuss with him, long term care patient on dialysis  Labs/tests ordered:  Bmp, hba1c in 1 week

## 2013-10-19 DIAGNOSIS — I10 Essential (primary) hypertension: Secondary | ICD-10-CM | POA: Diagnosis not present

## 2013-10-19 DIAGNOSIS — E119 Type 2 diabetes mellitus without complications: Secondary | ICD-10-CM | POA: Diagnosis not present

## 2013-10-21 DIAGNOSIS — E119 Type 2 diabetes mellitus without complications: Secondary | ICD-10-CM | POA: Diagnosis not present

## 2013-10-21 DIAGNOSIS — I2510 Atherosclerotic heart disease of native coronary artery without angina pectoris: Secondary | ICD-10-CM | POA: Diagnosis not present

## 2013-10-30 ENCOUNTER — Encounter: Payer: Self-pay | Admitting: Internal Medicine

## 2013-10-30 ENCOUNTER — Non-Acute Institutional Stay (SKILLED_NURSING_FACILITY): Payer: Medicare Other | Admitting: Internal Medicine

## 2013-10-30 DIAGNOSIS — I10 Essential (primary) hypertension: Secondary | ICD-10-CM

## 2013-10-30 DIAGNOSIS — E1129 Type 2 diabetes mellitus with other diabetic kidney complication: Secondary | ICD-10-CM

## 2013-10-30 DIAGNOSIS — IMO0002 Reserved for concepts with insufficient information to code with codable children: Secondary | ICD-10-CM

## 2013-10-30 DIAGNOSIS — E785 Hyperlipidemia, unspecified: Secondary | ICD-10-CM

## 2013-10-30 DIAGNOSIS — R609 Edema, unspecified: Secondary | ICD-10-CM

## 2013-10-30 DIAGNOSIS — E1165 Type 2 diabetes mellitus with hyperglycemia: Principal | ICD-10-CM

## 2013-10-30 DIAGNOSIS — N058 Unspecified nephritic syndrome with other morphologic changes: Secondary | ICD-10-CM

## 2013-10-30 NOTE — Progress Notes (Signed)
Patient ID: Karin LieuJimmy Tashiro, male   DOB: 06/28/1941, 73 y.o.   MRN: [id number]     [facility] [employer]  Chief Complaint  Patient presents with  . Medical Managment of Chronic Issues    HPI 73 y/o male pt here for long term care here for long term care. Seen in his room today. He is in no distress. At his baseline. No new concern from patient, nursing staff. No falls, skin concern reported. Compliant with his meds and diet at present  Review of Systems  Constitutional: Negative for fever, chills, weight loss, malaise/fatigue and diaphoresis.  HENT: Negative for congestion, hearing loss and sore throat.   Eyes: Negative for blurred vision, double vision and discharge.  Respiratory: Negative for cough, sputum production, shortness of breath and wheezing.   Cardiovascular: Negative for chest pain, palpitations, orthopnea and leg swelling.  Gastrointestinal: Negative for heartburn, nausea, vomiting, abdominal pain, diarrhea and constipation.  Genitourinary: Negative for dysuria, urgency, frequency and flank pain.  Musculoskeletal: Negative for back pain, falls, myalgias.  Skin: Negative for itching and rash.  Neurological: Negative for dizziness, tingling, focal weakness and headaches.  Psychiatric/Behavioral: Negative for depression and memory loss. The patient is not nervous/anxious.    Past Medical History  Diagnosis Date  . Diabetes mellitus without complication   . Hyperlipidemia   . Hypertension   . Chronic kidney disease   . Edema   . Depression   . Diabetic retinopathy    [facility]  Physical exam BP 129/72  Pulse 72  Temp(Src) 97.7 F (36.5 C)  Resp 18  Ht 4\' 4"  (1.321 m)  Wt 237 lb (107.502 kg)  BMI 61.60 kg/m2  SpO2 98%  Constitutional: He is oriented to person, place, and time. He appears well-developed and well-nourished.  obese  Neck: Neck supple. No JVD present.  Cardiovascular: Normal rate and regular rhythm.   Respiratory: Effort normal and breath sounds  normal. No respiratory distress.  GI: Soft. Bowel sounds are normal. There is no tenderness.  Musculoskeletal:  Bilateral bka, normal ROM in upper extremity, trace edema to thighs  Neurological: He is alert and oriented to person, place, and time.  Skin: Skin is warm and dry.  Psychiatric: He has a normal mood and affect.    LABS REVIEWED:   04-20-13: wbc 6.3; hgb 10.5; hct 33.3; mcv 81.0; plt 211; chol 100; ldl 52; trig 130; hgb a1c 9.6 06-05-13: micro albumin 5.06 07-06-13: glucose 132; bun 35; creat 2.21; k+4.2; na++143; liver normal albumin 4.2; hgb a1c 9.2  10-21-13 wbc 6.5, hb 11.7, hct 36.9, plt 194, na 140, k 4.6, glu 257, bun 34, cr 2.37, ca 9.3, a1c 9.4   ASSESSMENT/ PLAN:  DM type 2, uncontrolled, with renal complications a1c 9.4. Continue lantus 55 units twice daily, novolog 32 units with meals,tradjenta 5 mg daily and will monitor cbg. Reinforced dietary compliance. Monitor renal function as is declining  Essential hypertension, benign continue his norvasc 10 mg daily, clonidine 0.2 mg weekly patch, lopressor 100 mg twice daily, hydralazine 10 mg twice daily and furosemide 60 mg daily. Monitor renal function and bp reading  Other and unspecified hyperlipidemia continue his lipitor 80 mg daily and fish oil 1 gm daily and will monitor   Edema continue lasix 60 mg daily and will monitor renal function. Consider decreasing the dose of lasix or changing to torsemide next visit

## 2013-11-04 DIAGNOSIS — M1990 Unspecified osteoarthritis, unspecified site: Secondary | ICD-10-CM | POA: Insufficient documentation

## 2013-11-04 DIAGNOSIS — I96 Gangrene, not elsewhere classified: Secondary | ICD-10-CM | POA: Diagnosis not present

## 2013-11-04 DIAGNOSIS — E119 Type 2 diabetes mellitus without complications: Secondary | ICD-10-CM | POA: Diagnosis not present

## 2013-11-04 DIAGNOSIS — G608 Other hereditary and idiopathic neuropathies: Secondary | ICD-10-CM | POA: Diagnosis not present

## 2013-11-27 ENCOUNTER — Non-Acute Institutional Stay (SKILLED_NURSING_FACILITY): Payer: Medicare Other | Admitting: Internal Medicine

## 2013-11-27 DIAGNOSIS — E1129 Type 2 diabetes mellitus with other diabetic kidney complication: Secondary | ICD-10-CM | POA: Diagnosis not present

## 2013-11-27 DIAGNOSIS — R609 Edema, unspecified: Secondary | ICD-10-CM | POA: Diagnosis not present

## 2013-11-27 DIAGNOSIS — E1165 Type 2 diabetes mellitus with hyperglycemia: Secondary | ICD-10-CM

## 2013-11-27 DIAGNOSIS — N058 Unspecified nephritic syndrome with other morphologic changes: Secondary | ICD-10-CM

## 2013-11-27 DIAGNOSIS — K5900 Constipation, unspecified: Secondary | ICD-10-CM

## 2013-11-27 DIAGNOSIS — I10 Essential (primary) hypertension: Secondary | ICD-10-CM | POA: Diagnosis not present

## 2013-11-27 DIAGNOSIS — E1121 Type 2 diabetes mellitus with diabetic nephropathy: Secondary | ICD-10-CM | POA: Insufficient documentation

## 2013-11-27 DIAGNOSIS — IMO0002 Reserved for concepts with insufficient information to code with codable children: Secondary | ICD-10-CM

## 2013-11-27 NOTE — Progress Notes (Signed)
Patient ID: Marc Wheeler, male   DOB: February 19, 1941, 73 y.o.   MRN: [id number]    [name] living [employer] Chief Complaint  Patient presents with  . Medical Managment of Chronic Issues   No Known Allergies  HPI 73 y/o male patient is here for long term care. He is seen in his room today. He is in no distress. At his baseline. No new concern from patient, nursing staff. No falls, skin concern reported. Compliant with his meds and diet at present. Sugar readings better controlled  Review of Systems  Constitutional: Negative for fever, chills, weight loss, malaise/fatigue and diaphoresis.  HENT: Negative for congestion, hearing loss and sore throat.   Eyes: Negative for blurred vision, double vision and discharge.  Respiratory: Negative for cough, sputum production, shortness of breath and wheezing.   Cardiovascular: Negative for chest pain, palpitations, orthopnea and leg swelling.  Gastrointestinal: Negative for heartburn, nausea, vomiting, abdominal pain, diarrhea and constipation.  Genitourinary: Negative for dysuria, urgency, frequency and flank pain.  Musculoskeletal: Negative for back pain, falls, myalgias.  Skin: Negative for itching and rash.  Neurological: Negative for dizziness, tingling, focal weakness and headaches.  Psychiatric/Behavioral: Negative for depression and memory loss. The patient is not nervous/anxious.    Past Medical History  Diagnosis Date  . Diabetes mellitus without complication   . Hyperlipidemia   . Hypertension   . Chronic kidney disease   . Edema   . Depression   . Diabetic retinopathy    Medication reviewed. See [facility]  Physical exam BP 140/76  Pulse 68  Temp(Src) 97.2 F (36.2 C)  Resp 18  Ht 4\' 4"  (1.321 m)  Wt 237 lb (107.502 kg)  BMI 61.60 kg/m2  SpO2 98%  Constitutional: He is oriented to person, place, and time. He appears well-developed and well-nourished.  obese   Neck: Neck supple. No JVD present.   Cardiovascular: Normal rate  and regular rhythm.    Respiratory: Effort normal and breath sounds normal. No respiratory distress.   GI: Soft. Bowel sounds are normal. There is no tenderness.  Musculoskeletal:  Bilateral bka, normal ROM in upper extremity, trace edema to thighs  Neurological: He is alert and oriented to person, place, and time.   Skin: Skin is warm and dry.  Psychiatric: He has a normal mood and affect.    LABS REVIEWED:   04-20-13: wbc 6.3; hgb 10.5; hct 33.3; mcv 81.0; plt 211; chol 100; ldl 52; trig 130; hgb a1c 9.6 06-05-13: micro albumin 5.06 07-06-13: glucose 132; bun 35; creat 2.21; k+4.2; na++143; liver normal albumin 4.2; hgb a1c 9.2   10-21-13 wbc 6.5, hb 11.7, hct 36.9, plt 194, na 140, k 4.6, glu 257, bun 34, cr 2.37, ca 9.3, a1c 9.4 11-04-13 a1c 9.6, hb 12.3, hct 37.5, cr 2.43, bun 36  ASSESSMENT/ PLAN:  DM type 2, uncontrolled, with renal complications a1c 9.6. Continue lantus 55 units twice daily, novolog 32 units with meals,tradjenta 5 mg daily and will monitor cbg. Reinforced dietary compliance. Monitor renal function   Renal impairment In setting HTN, DM and PVD. Monitor clinically  Edema Impaired renal function. Will d/c lasix and have him on torsemide 20 mg daily and reassess   Essential hypertension, benign continue his norvasc 10 mg daily, clonidine 0.2 mg weekly patch, lopressor 100 mg twice daily, hydralazine 10 mg twice daily. Monitor renal function and bp reading  Constipation Continue current bowel regimen  Other and unspecified hyperlipidemia continue his lipitor 80 mg daily and fish oil 1  gm daily and will monitor    No new labs ordered

## 2013-12-12 ENCOUNTER — Encounter: Payer: Self-pay | Admitting: Internal Medicine

## 2014-01-29 ENCOUNTER — Non-Acute Institutional Stay (SKILLED_NURSING_FACILITY): Payer: Medicare Other | Admitting: Internal Medicine

## 2014-01-29 DIAGNOSIS — IMO0002 Reserved for concepts with insufficient information to code with codable children: Secondary | ICD-10-CM

## 2014-01-29 DIAGNOSIS — E1129 Type 2 diabetes mellitus with other diabetic kidney complication: Secondary | ICD-10-CM | POA: Diagnosis not present

## 2014-01-29 DIAGNOSIS — R609 Edema, unspecified: Secondary | ICD-10-CM | POA: Diagnosis not present

## 2014-01-29 DIAGNOSIS — I10 Essential (primary) hypertension: Secondary | ICD-10-CM

## 2014-01-29 DIAGNOSIS — E1165 Type 2 diabetes mellitus with hyperglycemia: Secondary | ICD-10-CM

## 2014-01-31 NOTE — Progress Notes (Signed)
Patient ID: Marc Wheeler, male   DOB: 06/24/1941, 73 y.o.   MRN: [id number]    [facility] [place]  Chief Complaint  Patient presents with  . Medical Managment of Chronic Issues    htn, dm type 2, ckd   No Known Allergies   HPI 73 y/o male patient with history of dm type 2, PVD s/p amputation, CKD, HTN is seen today for routine visit. His blood sugar reading has improved. He denies any concerns. No new concern from staff. No falls reported, no new skin concern.   Review of Systems   Constitutional: Negative for fever, chills, weight loss, malaise/fatigue and diaphoresis.   HENT: Negative for congestion, hearing loss and sore throat.    Eyes: Negative for blurred vision, double vision and discharge.   Respiratory: Negative for cough, sputum production, shortness of breath and wheezing.    Cardiovascular: Negative for chest pain, palpitations, orthopnea and leg swelling.   Gastrointestinal: Negative for heartburn, nausea, vomiting, abdominal pain, diarrhea and constipation.   Genitourinary: Negative for dysuria, urgency, frequency and flank pain.   Musculoskeletal: Negative for back pain, falls, myalgias.   Skin: Negative for itching and rash.   Neurological: Negative for dizziness, tingling, focal weakness and headaches.   Psychiatric/Behavioral: Negative for depression and memory loss. The patient is not nervous/anxious.    Past Medical History  Diagnosis Date  . Diabetes mellitus without complication   . Hyperlipidemia   . Hypertension   . Chronic kidney disease   . Edema   . Depression   . Diabetic retinopathy    Past Surgical History  Procedure Laterality Date  . Leg amputation below knee Bilateral   . Right retinal detachment repair  1 22 14    [facility]  Physical exam BP 140/80  Pulse 72  Temp(Src) 97.5 F (36.4 C)  Resp 18  Wt 233 lb (105.688 kg)  SpO2 98%  Constitutional: He is oriented to person, place, and time. He appears  well-developed and well-nourished.  obese   Neck: Neck supple. No JVD present.   Cardiovascular: Normal rate and regular rhythm.    Respiratory: Effort normal and breath sounds normal. No respiratory distress.   GI: Soft. Bowel sounds are normal. There is no tenderness.  Musculoskeletal: Bilateral BKA normal ROM in upper extremity, trace edema to thighs  Neurological: He is alert and oriented to person, place, and time.   Skin: Skin is warm and dry.  Psychiatric: He has a normal mood and affect.    LABS REVIEWED:   04-20-13: wbc 6.3; hgb 10.5; hct 33.3; mcv 81.0; plt 211; chol 100; ldl 52; trig 130; hgb a1c 9.6 06-05-13: micro albumin 5.06 07-06-13: glucose 132; bun 35; creat 2.21; k+4.2; na++143; liver normal albumin 4.2; hgb a1c 9.2   10-21-13 wbc 6.5, hb 11.7, hct 36.9, plt 194, na 140, k 4.6, glu 257, bun 34, cr 2.37, ca 9.3, a1c 9.4 11-04-13 a1c 9.6, hb 12.3, hct 37.5, cr 2.43, bun 36  ASSESSMENT/ PLAN:  Essential hypertension, benign continue his norvasc 10 mg daily, clonidine 0.2 mg weekly patch, lopressor 100 mg twice daily, hydralazine 10 mg twice daily. Monitor renal function and bp reading  DM type 2, uncontrolled, with renal complications a1c 9.6 in jan. Recheck a1c given imporved cbg readings. If a1c around 7, consider decreasing dose of aspart. Monitor cbg.Continue lantus 55 units twice daily, novolog 32 units with meals,tradjenta 5 mg daily for now. Continue asa and statin  Edema Stable with torsemide, continue  this

## 2014-02-01 DIAGNOSIS — E785 Hyperlipidemia, unspecified: Secondary | ICD-10-CM | POA: Diagnosis not present

## 2014-02-01 DIAGNOSIS — E119 Type 2 diabetes mellitus without complications: Secondary | ICD-10-CM | POA: Diagnosis not present

## 2014-02-25 DIAGNOSIS — N183 Chronic kidney disease, stage 3 unspecified: Secondary | ICD-10-CM | POA: Diagnosis not present

## 2014-02-25 DIAGNOSIS — E1129 Type 2 diabetes mellitus with other diabetic kidney complication: Secondary | ICD-10-CM | POA: Diagnosis not present

## 2014-02-25 DIAGNOSIS — I129 Hypertensive chronic kidney disease with stage 1 through stage 4 chronic kidney disease, or unspecified chronic kidney disease: Secondary | ICD-10-CM | POA: Diagnosis not present

## 2014-02-25 DIAGNOSIS — D631 Anemia in chronic kidney disease: Secondary | ICD-10-CM | POA: Diagnosis not present

## 2014-02-25 DIAGNOSIS — R809 Proteinuria, unspecified: Secondary | ICD-10-CM | POA: Diagnosis not present

## 2014-02-25 DIAGNOSIS — N2581 Secondary hyperparathyroidism of renal origin: Secondary | ICD-10-CM | POA: Diagnosis not present

## 2014-02-25 DIAGNOSIS — N039 Chronic nephritic syndrome with unspecified morphologic changes: Secondary | ICD-10-CM | POA: Diagnosis not present

## 2014-03-04 ENCOUNTER — Non-Acute Institutional Stay (SKILLED_NURSING_FACILITY): Payer: Medicare Other | Admitting: Internal Medicine

## 2014-03-04 DIAGNOSIS — I129 Hypertensive chronic kidney disease with stage 1 through stage 4 chronic kidney disease, or unspecified chronic kidney disease: Secondary | ICD-10-CM

## 2014-03-04 DIAGNOSIS — E1322 Other specified diabetes mellitus with diabetic chronic kidney disease: Secondary | ICD-10-CM

## 2014-03-04 DIAGNOSIS — K5900 Constipation, unspecified: Secondary | ICD-10-CM

## 2014-03-04 DIAGNOSIS — R609 Edema, unspecified: Secondary | ICD-10-CM

## 2014-03-04 DIAGNOSIS — E1365 Other specified diabetes mellitus with hyperglycemia: Secondary | ICD-10-CM

## 2014-03-04 DIAGNOSIS — N189 Chronic kidney disease, unspecified: Secondary | ICD-10-CM

## 2014-03-04 DIAGNOSIS — E1329 Other specified diabetes mellitus with other diabetic kidney complication: Secondary | ICD-10-CM | POA: Diagnosis not present

## 2014-03-04 DIAGNOSIS — N183 Chronic kidney disease, stage 3 unspecified: Secondary | ICD-10-CM

## 2014-03-04 DIAGNOSIS — IMO0002 Reserved for concepts with insufficient information to code with codable children: Secondary | ICD-10-CM

## 2014-03-04 NOTE — Progress Notes (Signed)
Patient ID: Karin LieuJimmy Jarrells, male   DOB: 01/17/1941, 73 y.o.   MRN: [id number]    [facility] [place] Chief Complaint  Patient presents with  . Medical Management of Chronic Issues    rv   No Known Allergies  HPI 73 y/o male patient with history of dm type 2, PVD s/p amputation, CKD, HTN is seen today for routine visit. He denies any concerns. He is not using his stump shrinker. cbg reviewed 128-260. No new concern from staff. No falls reported, no new skin concern.   Review of Systems   Constitutional: Negative for fever, chills, weight loss, malaise/fatigue and diaphoresis.   HENT: Negative for congestion, hearing loss and sore throat.    Eyes: Negative for blurred vision, double vision and discharge.   Respiratory: Negative for cough, sputum production, shortness of breath and wheezing.    Cardiovascular: Negative for chest pain, palpitations, orthopnea and leg swelling.   Gastrointestinal: Negative for heartburn, nausea, vomiting, abdominal pain, diarrhea and constipation.   Genitourinary: Negative for dysuria, urgency, frequency and flank pain.   Musculoskeletal: Negative for back pain, falls, myalgias.   Skin: Negative for itching and rash.   Neurological: Negative for dizziness, tingling, focal weakness and headaches.   Psychiatric/Behavioral: Negative for depression and memory loss. The patient is not nervous/anxious.    Past Medical History  Diagnosis Date  . Diabetes mellitus without complication   . Hyperlipidemia   . Hypertension   . Chronic kidney disease   . Edema   . Depression   . Diabetic retinopathy    [facility]  Physical exam BP 140/76  Pulse 64  Temp(Src) 97 F (36.1 C)  Resp 17  Wt 231 lb (104.781 kg)  SpO2 98%  Constitutional: He is oriented to person, place, and time. He appears well-developed and well-nourished.  obese   Neck: Neck supple. No JVD present.   Cardiovascular: Normal rate and regular rhythm.    Respiratory:  Effort normal and breath sounds normal. No respiratory distress.   GI: Soft. Bowel sounds are normal. There is no tenderness.  Musculoskeletal: Bilateral BKA normal ROM in upper extremity, increased edema in thighs  Neurological: He is alert and oriented to person, place, and time.   Skin: Skin is warm and dry.  Psychiatric: He has a normal mood and affect.    LABS REVIEWED:   04-20-13: wbc 6.3; hgb 10.5; hct 33.3; mcv 81.0; plt 211; chol 100; ldl 52; trig 130; hgb a1c 9.6 06-05-13: micro albumin 5.06 07-06-13: glucose 132; bun 35; creat 2.21, gfr 31; k+4.2; na++143; liver normal albumin 4.2; hgb a1c 9.2   10-21-13 wbc 6.5, hb 11.7, hct 36.9, plt 194, na 140, k 4.6, glu 257, bun 34, cr 2.37, ca 9.3, a1c 9.4 11-04-13 a1c 9.6, hb 12.3, hct 37.5, cr 2.43, bun 36 02-01-14 a1c 8.6, chol 105, tg 239, hdl 23, ldl 48  ASSESSMENT/ PLAN:  DM type 2, uncontrolled, with renal complications a1c 8.6 now from 9.6 showing improvement of sugar control. Continue to monitor cbg. Continue lantus 55 units twice daily, and tradjenta 5 mg daily for now. Continue aspart with meals  Continue asa and statin. Lisinopril started to help with renal remodelling  ckd stage 3 Reviewed renal note from Dr Arrie Aranoladonato. To avoid NSAIDs. Plan was to start ACEI/ARB if renal function allowed. given his last cr of 2.43, will put him on lisinopril 2.5 mg daily for now  edema Increased. Will go up on torsemide to 20 mg bid  for 1 week and reassess. Recheck cmp in 1 week and decrease torsemide to 20 mg daily  Hypertensive renal disease continue his norvasc 10 mg daily, clonidine 0.2 mg weekly patch, lopressor 100 mg twice daily, hydralazine 10 mg twice daily. Monitor renal function and bp reading.  Constipation Stable on colace and miralax for now. Monitor clinically.   Labs- cmp, flp in 1 week

## 2014-03-05 ENCOUNTER — Encounter: Payer: Self-pay | Admitting: Internal Medicine

## 2014-03-05 DIAGNOSIS — K5900 Constipation, unspecified: Secondary | ICD-10-CM | POA: Insufficient documentation

## 2014-03-05 DIAGNOSIS — IMO0002 Reserved for concepts with insufficient information to code with codable children: Secondary | ICD-10-CM | POA: Insufficient documentation

## 2014-03-05 DIAGNOSIS — E1365 Other specified diabetes mellitus with hyperglycemia: Secondary | ICD-10-CM

## 2014-03-05 DIAGNOSIS — N183 Chronic kidney disease, stage 3 (moderate): Secondary | ICD-10-CM

## 2014-03-05 DIAGNOSIS — E1329 Other specified diabetes mellitus with other diabetic kidney complication: Secondary | ICD-10-CM | POA: Insufficient documentation

## 2014-03-05 DIAGNOSIS — E1322 Other specified diabetes mellitus with diabetic chronic kidney disease: Secondary | ICD-10-CM | POA: Insufficient documentation

## 2014-03-05 DIAGNOSIS — I129 Hypertensive chronic kidney disease with stage 1 through stage 4 chronic kidney disease, or unspecified chronic kidney disease: Secondary | ICD-10-CM | POA: Insufficient documentation

## 2014-03-12 DIAGNOSIS — N183 Chronic kidney disease, stage 3 unspecified: Secondary | ICD-10-CM | POA: Diagnosis not present

## 2014-03-12 DIAGNOSIS — I2510 Atherosclerotic heart disease of native coronary artery without angina pectoris: Secondary | ICD-10-CM | POA: Diagnosis not present

## 2014-04-30 ENCOUNTER — Encounter: Payer: Self-pay | Admitting: Internal Medicine

## 2014-04-30 ENCOUNTER — Non-Acute Institutional Stay (SKILLED_NURSING_FACILITY): Payer: Medicare Other | Admitting: Internal Medicine

## 2014-04-30 DIAGNOSIS — E119 Type 2 diabetes mellitus without complications: Secondary | ICD-10-CM | POA: Diagnosis not present

## 2014-04-30 DIAGNOSIS — N184 Chronic kidney disease, stage 4 (severe): Secondary | ICD-10-CM

## 2014-04-30 DIAGNOSIS — E1165 Type 2 diabetes mellitus with hyperglycemia: Secondary | ICD-10-CM | POA: Diagnosis not present

## 2014-04-30 DIAGNOSIS — I10 Essential (primary) hypertension: Secondary | ICD-10-CM | POA: Diagnosis not present

## 2014-04-30 DIAGNOSIS — N183 Chronic kidney disease, stage 3 unspecified: Secondary | ICD-10-CM | POA: Diagnosis not present

## 2014-04-30 DIAGNOSIS — E1129 Type 2 diabetes mellitus with other diabetic kidney complication: Secondary | ICD-10-CM

## 2014-04-30 NOTE — Progress Notes (Deleted)
Subjective:     Patient ID: Marc Wheeler, male   DOB: 06/10/1941, 73 y.o.   MRN: [id number]  HPI   Review of Systems     Objective:   Physical Exam     Assessment:     ***    Plan:     ***

## 2014-04-30 NOTE — Progress Notes (Signed)
Patient ID: Marc Wheeler, male   DOB: 08/09/1941, 73 y.o.   MRN: [id number]    [name] [employer]  Chief Complaint  Patient presents with  . Medical Management of Chronic Issues   No Known Allergies  HPI:  73 y/o male patient with history of dm type 2, PVD s/p bilateral below knee amputations, CKD, HTN is seen today for routine visit. He denies any concerns. Felt dizzy around 11a.m., cbg was 72. Reports dizziness resolved after he ate some candies. He is not using his stump shrinker. cbg reviewed 87-263. No new concern from staff. No falls reported, no new skin concern.   Review of Systems   Constitutional: Negative for fever, chills HEENT: Negative for blurred vision, congestion, hearing loss, and sore throat.    Respiratory: Negative for cough and shortness of breath Cardiovascular: Negative for chest pain, palpitations, orthopnea, and leg swelling.   Gastrointestinal: Negative for heartburn, nausea, vomiting, abdominal pain, diarrhea and constipation.   Genitourinary: Negative for dysuria, urgency, frequency and flank pain.   Musculoskeletal: Negative for back pain, falls, myalgias.   Skin: Negative for itching and rash.   Neurological: Negative for tingling, focal weakness and headaches.   Psychiatric/Behavioral: Negative for depression and memory loss. The patient is not nervous/anxious.    Past Medical History  Diagnosis Date  . Diabetes mellitus without complication   . Hyperlipidemia   . Hypertension   . Chronic kidney disease   . Edema   . Depression   . Diabetic retinopathy     Current Outpatient Prescriptions on File Prior to Visit  Medication Sig Dispense Refill  . acetaminophen (TYLENOL) 325 MG tablet Take 650 mg by mouth every 4 (four) hours as needed for pain.      Marland Kitchen amLODipine (NORVASC) 10 MG tablet Take 10 mg by mouth daily.      Marland Kitchen aspirin EC 81 MG tablet Take 81 mg by mouth daily.      Marland Kitchen atorvastatin (LIPITOR) 80 MG tablet Take 80 mg by mouth daily.       Marland Kitchen b complex-vitamin c-folic acid (NEPHRO-VITE) 0.8 MG TABS Take 0.8 mg by mouth at bedtime.      . Calcium Carb-Cholecalciferol (CALCIUM 500 +D) 500-400 MG-UNIT TABS Take 1 tablet by mouth 2 (two) times daily.      . cloNIDine (CATAPRES - DOSED IN MG/24 HR) 0.2 mg/24hr patch Place 1 patch onto the skin once a week.      . diclofenac sodium (VOLTAREN) 1 % GEL Apply 2 g topically 4 (four) times daily as needed (shoulder pain).      Marland Kitchen docusate sodium (COLACE) 100 MG capsule Take 100 mg by mouth 2 (two) times daily.      . fish oil-omega-3 fatty acids 1000 MG capsule Take 1 g by mouth daily.      . hydrALAZINE (APRESOLINE) 10 MG tablet Take 10 mg by mouth 2 (two) times daily.      . insulin aspart (NOVOLOG) 100 UNIT/ML injection Inject 32 Units into the skin 3 (three) times daily before meals.      . insulin glargine (LANTUS) 100 UNIT/ML injection Inject 0.55 mLs (55 Units total) into the skin 2 (two) times daily.  10 mL  0  . linagliptin (TRADJENTA) 5 MG TABS tablet Take 5 mg by mouth daily.      . metoprolol (LOPRESSOR) 100 MG tablet Take 100 mg by mouth 2 (two) times daily.      Bertram Gala Glycol-Propyl Glycol (SYSTANE) 0.4-0.3 %  SOLN Apply 1 drop to eye daily. Both eyes      . torsemide (DEMADEX) 20 MG tablet Take 20 mg by mouth daily.       No current facility-administered medications on file prior to visit.    Physical Exam: BP 136/76  Pulse 68  Temp(Src) 97.5 F (36.4 C)  Resp 20  Ht 4\' 4"  (1.321 m)  Wt 227 lb (102.967 kg)  BMI 59.01 kg/m2  SpO2 98%  Constitutional: He is oriented to person, place, and time. He appears well-developed and well-nourished. Obese   Neck: Neck supple. No JVD present. No bruits Cardiovascular: Normal rate and regular rhythm. No murmurs, rubs, or gallops Respiratory: Effort normal and breath sounds normal. No respiratory distress.   GI: Soft. Bowel sounds are normal. There is no tenderness.  Musculoskeletal: Bilateral BKA normal ROM in upper  extremity.  Neurological: He is alert and oriented to person, place, and time.   Skin: Skin is warm and dry.  Psychiatric: He has a normal mood and affect.    LABS REVIEWED:   04-20-13: wbc 6.3; hgb 10.5; hct 33.3; mcv 81.0; plt 211; chol 100; ldl 52; trig 130; hgb a1c 9.6 06-05-13: micro albumin 5.06 07-06-13: glucose 132; bun 35; creat 2.21, gfr 31; k+4.2; na++143; liver normal albumin 4.2; hgb a1c 9.2   10-21-13 wbc 6.5, hb 11.7, hct 36.9, plt 194, na 140, k 4.6, glu 257, bun 34, cr 2.37, ca 9.3, a1c 9.4 11-04-13 a1c 9.6, hb 12.3, hct 37.5, cr 2.43, bun 36 02-01-14 a1c 8.6, chol 105, tg 239, hdl 23, ldl 48 04/30/14 na [id number], k4.6, chl 110, gluc 130, bun 57, cr 3.15, a1c 8.1  ASSESSMENT/ PLAN:  1. DM type 2, uncontrolled, with renal complications A1c decreased to 8.1 from 8.6, showing improvement of sugar control. Continue to monitor cbg. Continue lantus 55 units twice daily, and tradjenta 5 mg daily for now. Continue aspart with meals  Continue asa and statin. D/c lisinopril due to worsening renal function  2. CKD, stage 4 Cr increased to 3.15 from 2.43. Discontinue lisinopril 2.5mg  daily due to worsening renal function. Discontinue torsemide 20mg  po QD for now and reassess on need for diuretics. Continue to monitor renal function. Continue to f/u with nephrology.   3. HTN Stable. Continue his norvasc 10 mg daily, clonidine 0.2 mg weekly patch, lopressor 100 mg twice daily, hydralazine 10 mg twice daily. Monitor renal function and BP reading. He is taken off torsemide and lisinopril today. If bp starts to rise, consider adjusting dose of hydralazine  Plan of care discuss with nursing staff. Nursing staff verbalize understanding and agree with plan of care.

## 2014-05-07 DIAGNOSIS — E11311 Type 2 diabetes mellitus with unspecified diabetic retinopathy with macular edema: Secondary | ICD-10-CM | POA: Diagnosis not present

## 2014-05-07 DIAGNOSIS — E11359 Type 2 diabetes mellitus with proliferative diabetic retinopathy without macular edema: Secondary | ICD-10-CM | POA: Diagnosis not present

## 2014-05-07 DIAGNOSIS — H3340 Traction detachment of retina, unspecified eye: Secondary | ICD-10-CM | POA: Diagnosis not present

## 2014-05-07 DIAGNOSIS — H3581 Retinal edema: Secondary | ICD-10-CM | POA: Diagnosis not present

## 2014-05-07 DIAGNOSIS — E1139 Type 2 diabetes mellitus with other diabetic ophthalmic complication: Secondary | ICD-10-CM | POA: Diagnosis not present

## 2014-05-07 DIAGNOSIS — H348392 Tributary (branch) retinal vein occlusion, unspecified eye, stable: Secondary | ICD-10-CM | POA: Diagnosis not present

## 2014-05-24 DIAGNOSIS — N183 Chronic kidney disease, stage 3 unspecified: Secondary | ICD-10-CM | POA: Diagnosis not present

## 2014-05-24 DIAGNOSIS — G608 Other hereditary and idiopathic neuropathies: Secondary | ICD-10-CM | POA: Diagnosis not present

## 2014-05-24 DIAGNOSIS — I10 Essential (primary) hypertension: Secondary | ICD-10-CM | POA: Diagnosis not present

## 2014-05-24 DIAGNOSIS — N390 Urinary tract infection, site not specified: Secondary | ICD-10-CM | POA: Diagnosis not present

## 2014-05-24 DIAGNOSIS — D6489 Other specified anemias: Secondary | ICD-10-CM | POA: Diagnosis not present

## 2014-05-24 DIAGNOSIS — D649 Anemia, unspecified: Secondary | ICD-10-CM | POA: Diagnosis not present

## 2014-05-24 DIAGNOSIS — G9341 Metabolic encephalopathy: Secondary | ICD-10-CM | POA: Diagnosis not present

## 2014-05-24 DIAGNOSIS — E119 Type 2 diabetes mellitus without complications: Secondary | ICD-10-CM | POA: Diagnosis not present

## 2014-05-24 DIAGNOSIS — E785 Hyperlipidemia, unspecified: Secondary | ICD-10-CM | POA: Diagnosis not present

## 2014-05-24 DIAGNOSIS — E11319 Type 2 diabetes mellitus with unspecified diabetic retinopathy without macular edema: Secondary | ICD-10-CM | POA: Diagnosis not present

## 2014-07-07 ENCOUNTER — Other Ambulatory Visit: Payer: Self-pay | Admitting: Internal Medicine

## 2014-07-07 DIAGNOSIS — N184 Chronic kidney disease, stage 4 (severe): Secondary | ICD-10-CM | POA: Diagnosis not present

## 2014-07-07 LAB — URINALYSIS, ROUTINE W REFLEX MICROSCOPIC
Bilirubin Urine: NEGATIVE
Glucose, UA: NEGATIVE mg/dL
Hgb urine dipstick: NEGATIVE
Leukocytes, UA: NEGATIVE
Nitrite: POSITIVE — AB
Protein, ur: 30 mg/dL — AB
Specific Gravity, Urine: 1.017 (ref 1.005–1.030)
Urobilinogen, UA: 0.2 mg/dL (ref 0.0–1.0)
pH: 7 (ref 5.0–8.0)

## 2014-07-07 LAB — URINALYSIS, MICROSCOPIC ONLY
Bacteria, UA: NONE SEEN
Casts: NONE SEEN
Crystals: NONE SEEN

## 2014-07-16 ENCOUNTER — Encounter: Payer: Self-pay | Admitting: Internal Medicine

## 2014-07-16 ENCOUNTER — Non-Acute Institutional Stay (SKILLED_NURSING_FACILITY): Payer: Medicare Other | Admitting: Internal Medicine

## 2014-07-16 DIAGNOSIS — E1122 Type 2 diabetes mellitus with diabetic chronic kidney disease: Secondary | ICD-10-CM

## 2014-07-16 DIAGNOSIS — I10 Essential (primary) hypertension: Secondary | ICD-10-CM | POA: Diagnosis not present

## 2014-07-16 DIAGNOSIS — N183 Chronic kidney disease, stage 3 unspecified: Secondary | ICD-10-CM

## 2014-07-16 DIAGNOSIS — E1165 Type 2 diabetes mellitus with hyperglycemia: Secondary | ICD-10-CM | POA: Diagnosis not present

## 2014-07-16 DIAGNOSIS — E785 Hyperlipidemia, unspecified: Secondary | ICD-10-CM

## 2014-07-16 DIAGNOSIS — IMO0002 Reserved for concepts with insufficient information to code with codable children: Secondary | ICD-10-CM

## 2014-07-16 DIAGNOSIS — E1129 Type 2 diabetes mellitus with other diabetic kidney complication: Secondary | ICD-10-CM

## 2014-07-16 DIAGNOSIS — R350 Frequency of micturition: Secondary | ICD-10-CM

## 2014-07-16 HISTORY — DX: Hyperlipidemia, unspecified: E78.5

## 2014-07-16 NOTE — Progress Notes (Signed)
Patient ID: Marc Wheeler, male   DOB: 1941/08/11, 74 y.o.   MRN: [id number]    Facility: [facility]  Chief Complaint  Patient presents with  . Medical Management of Chronic Issues   No Known Allergies  HPI   73 y/o male patient is seen today for routine visit. He has DM type 2, ckd, PVD s/p BKA, HTN. He is non complaint with his diet and his bp has been running high. On review SBP all above 140 with several greater than 150.  cbg reviewed- this am 145 at breakfast and 155 at lunch time. Most of the day sugar are below 200 and as per staff some morning cbg is between 70-80.  He continues to drink soda and have chips and pretzels He reports getting up twice at night to urinate, new for him and at night, he feels his urine is darker in color He drinks soda around 8 pm every night. Denies dysuria or flank pain No falls reported, no new skin concern.   Review of Systems   Constitutional: Negative for fever, chills HEENT: Negative for blurred vision, congestion, hearing loss, and sore throat.    Respiratory: Negative for cough and shortness of breath Cardiovascular: Negative for chest pain, palpitations, orthopnea. Has leg swelling.   Gastrointestinal: Negative for heartburn, nausea, vomiting, abdominal pain, diarrhea and constipation.   Genitourinary: Negative for dysuria   Musculoskeletal: Negative for back pain, falls, myalgias.   Skin: Negative for itching and rash.   Neurological: Negative for tingling, focal weakness and headaches.   Psychiatric/Behavioral: Negative for depression and memory loss. The patient is not nervous/anxious.    Past Medical History  Diagnosis Date  . Diabetes mellitus without complication   . Hyperlipidemia   . Hypertension   . Chronic kidney disease   . Edema   . Depression   . Diabetic retinopathy      Medication List       This list is accurate as of: 07/16/14  2:57 PM.  Always use your most recent med list.              acetaminophen 325 MG tablet  Commonly known as:  TYLENOL  Take 650 mg by mouth every 4 (four) hours as needed for pain.     amLODipine 10 MG tablet  Commonly known as:  NORVASC  Take 10 mg by mouth daily.     aspirin EC 81 MG tablet  Take 81 mg by mouth daily.     atorvastatin 80 MG tablet  Commonly known as:  LIPITOR  Take 80 mg by mouth daily.     b complex-vitamin c-folic acid 0.8 MG Tabs tablet  Take 0.8 mg by mouth at bedtime.     CALCIUM 500 +D 500-400 MG-UNIT Tabs  Generic drug:  Calcium Carb-Cholecalciferol  Take 1 tablet by mouth 2 (two) times daily.     cloNIDine 0.2 mg/24hr patch  Commonly known as:  CATAPRES - Dosed in mg/24 hr  Place 1 patch onto the skin once a week.     diclofenac sodium 1 % Gel  Commonly known as:  VOLTAREN  Apply 2 g topically 4 (four) times daily as needed (shoulder pain).     docusate sodium 100 MG capsule  Commonly known as:  COLACE  Take 100 mg by mouth 2 (two) times daily.     fish oil-omega-3 fatty acids 1000 MG capsule  Take 1 g by mouth daily.     hydrALAZINE 10  MG tablet  Commonly known as:  APRESOLINE  Take 10 mg by mouth 2 (two) times daily.     insulin aspart 100 UNIT/ML injection  Commonly known as:  novoLOG  Inject 32 Units into the skin 3 (three) times daily before meals.     insulin glargine 100 UNIT/ML injection  Commonly known as:  LANTUS  Inject 0.55 mLs (55 Units total) into the skin 2 (two) times daily.     linagliptin 5 MG Tabs tablet  Commonly known as:  TRADJENTA  Take 5 mg by mouth daily.     metoprolol 100 MG tablet  Commonly known as:  LOPRESSOR  Take 100 mg by mouth 2 (two) times daily.     SYSTANE 0.4-0.3 % Soln  Generic drug:  Polyethyl Glycol-Propyl Glycol  Apply 1 drop to eye daily. Both eyes       Physical exam BP 150/70  Pulse 70  Temp(Src) 97.4 F (36.3 C)  Resp 18  Ht  (1.321 m)  Wt 237 lb (107.502 kg)  BMI 61.60 kg/m2  SpO2 98%  Constitutional: He is oriented to  person, place, and time. He appears well-developed and well-nourished. Obese   Neck: Neck supple. No JVD present. No bruits Cardiovascular: Normal rate and regular rhythm. No murmurs, rubs, or gallops Respiratory: Effort normal and breath sounds normal. No respiratory distress.   GI: Soft. Bowel sounds are normal. There is no tenderness.  Musculoskeletal: Bilateral BKA normal ROM in upper extremity. Trace edema in thigh areas Neurological: He is alert and oriented to person, place, and time.   Skin: Skin is warm and dry.  Psychiatric: He has a normal mood and affect.    LABS REVIEWED:   04-20-13: wbc 6.3; hgb 10.5; hct 33.3; mcv 81.0; plt 211; chol 100; ldl 52; trig 130; hgb a1c 9.6 06-05-13: micro albumin 5.06 07-06-13: glucose 132; bun 35; creat 2.21, gfr 31; k+4.2; na++143; liver normal albumin 4.2; hgb a1c 9.2   10-21-13 wbc 6.5, hb 11.7, hct 36.9, plt 194, na 140, k 4.6, glu 257, bun 34, cr 2.37, ca 9.3, a1c 9.4 11-04-13 a1c 9.6, hb 12.3, hct 37.5, cr 2.43, bun 36 02-01-14 a1c 8.6, chol 105, tg 239, hdl 23, ldl 48 04/30/14 na 161, k4.6, chl 110, gluc 130, bun 57, cr 3.15, a1c 8.1 05/24/14 na 143, k 4.7, bun 20, cr 1.74, hb 9.6, hct 30.4, mcv 83.1, plt 196  ASSESSMENT/ PLAN:  Hypertension Elevated bp readings- dietary compliance reinforced. Will increase his hydralazine to 25 mg bid for now. Continue weekly clonidine patch, amlodipine and metoprolol. Continue aspirin. Monitor bp daily and adjust further as indicated  Urinary frequency Only at night. Advised to avoid drinking fluid after 7 pm and if needed only to take small sips of water rather than soda. Check PSA to assess for BPH and screening for prostate problem.   Dm type 2 Last a1c 8.1. Check urine microalbumin and lipid panel. Continue lantus but with low am reading of cbg, change lantus to 55 u in am and 45 u in pm. Continue tradjenta and continue aspart 32 u with meals. Monitor cbg. Continue lipitor and fish oil with  aspirin  ckd stage 3 Stable, continue routine blood check every 3 months and send to renal office for review.   Hyperlipidemia Continue lipitor and fish oil. Check flp  Labs- a1c, urine microalbumin, lipid panel, cmp, PSA

## 2014-08-04 DIAGNOSIS — Z23 Encounter for immunization: Secondary | ICD-10-CM | POA: Diagnosis not present

## 2014-08-10 ENCOUNTER — Non-Acute Institutional Stay (SKILLED_NURSING_FACILITY): Payer: Medicare Other | Admitting: Internal Medicine

## 2014-08-10 ENCOUNTER — Encounter: Payer: Self-pay | Admitting: Internal Medicine

## 2014-08-10 DIAGNOSIS — I10 Essential (primary) hypertension: Secondary | ICD-10-CM | POA: Diagnosis not present

## 2014-08-10 NOTE — Progress Notes (Signed)
Patient ID: Marc Wheeler, male   DOB: 03/19/1941, 73 y.o.   MRN: [id number]   [facility]  No Known Allergies  Code Status: Full Code  Goals of Care: Longevity/Long-term care  Chief Complaint  Patient presents with   Acute Visit    elevated BPs    HPI  73 y.o. male with PMH of HTN, DM2 s/p bilateral BKA, stage 4 CKD, HLD, among others is being seen for an acute visit at the request of nursing staff for elevated BP readings. The patient's BPs are mostly in the 150s, with range between 140s-190s. He's currently on multiple BP meds: amlodipine 10mg  daily, clonidine 0.2mg /24hr transdermal patch once daily, hydralazine 10mg  twice daily, and lopressor 100mg  twice daily. He denies any dizziness or headache. No chest pain, palpitations, or shob. No falls reported. No other concerns from nursing staff.   Review of Systems Constitutional: No weight loss/weight gain, fever, chills, weakness, or fatigue. HEENT: No headache or dizziness. No vision or hearing loss.  Cardiovascular: No chest pain, chest discomfort, palpitations, or dependent edema. Respiratory: No cough, shortness of breath, or wheezing.  Gastrointestinal: No nausea or vomiting. No heartburn. No change in appetite. No diarrhea or constipation.  Musculoskeletal: No falls. No muscle pain, joint pain, or joint stiffness.  Neurological:  No weakness, tremor, paralysis, difficulty speaking or swallowing. No memory loss. Skin: No rashes or lesions.  Psychiatric: No change in personality or affect. No anxiety or depression.  Past Medical History  Diagnosis Date   Diabetes mellitus without complication    Hyperlipidemia    Hypertension    Chronic kidney disease    Edema    Depression    Diabetic retinopathy     Past Surgical History  Procedure Laterality Date   Leg amputation below knee Bilateral    Right retinal detachment repair  1 22 14     History   Social History    Marital Status: Widowed    Spouse Name: N/A    Number of Children: N/A   Years of Education: N/A   Occupational History   Not on file.   Social History Main Topics   Smoking status: Former Smoker   Smokeless tobacco: Not on file   Alcohol Use: Not on file   Drug Use: Not on file   Sexual Activity: Not on file   Other Topics Concern   Not on file   Social History Narrative   No narrative on file      Medication List       This list is accurate as of: 08/10/14 10:10 AM.  Always use your most recent med list.               acetaminophen 325 MG tablet  Commonly known as:  TYLENOL  Take 650 mg by mouth every 4 (four) hours as needed for pain.     amLODipine 10 MG tablet  Commonly known as:  NORVASC  Take 10 mg by mouth daily.     aspirin EC 81 MG tablet  Take 81 mg by mouth daily.     atorvastatin 80 MG tablet  Commonly known as:  LIPITOR  Take 80 mg by mouth daily.     b complex-vitamin c-folic acid 0.8 MG Tabs tablet  Take 0.8 mg by mouth at bedtime.     CALCIUM 500 +D 500-400 MG-UNIT Tabs  Generic drug:  Calcium Carb-Cholecalciferol  Take 1 tablet by mouth 2 (two) times daily.  cloNIDine 0.2 mg/24hr patch  Commonly known as:  CATAPRES - Dosed in mg/24 hr  Place 1 patch onto the skin once a week.     diclofenac sodium 1 % Gel  Commonly known as:  VOLTAREN  Apply 2 g topically 4 (four) times daily as needed (shoulder pain).     docusate sodium 100 MG capsule  Commonly known as:  COLACE  Take 100 mg by mouth 2 (two) times daily.     fish oil-omega-3 fatty acids 1000 MG capsule  Take 1 g by mouth daily.     hydrALAZINE 10 MG tablet  Commonly known as:  APRESOLINE  Take 10 mg by mouth 2 (two) times daily.     insulin aspart 100 UNIT/ML injection  Commonly known as:  novoLOG  Inject 32 Units into the skin 3 (three) times daily before meals.     insulin glargine 100 UNIT/ML injection  Commonly known as:  LANTUS  Inject 45-55 Units into  the skin 2 (two) times daily. Inject 45 units SQ at bedtime and 55 units SQ in the morning     linagliptin 5 MG Tabs tablet  Commonly known as:  TRADJENTA  Take 5 mg by mouth daily.     metoprolol 100 MG tablet  Commonly known as:  LOPRESSOR  Take 100 mg by mouth 2 (two) times daily.     SYSTANE 0.4-0.3 % Soln  Generic drug:  Polyethyl Glycol-Propyl Glycol  Apply 1 drop to eye daily. Both eyes        Physical Exam  Filed Vitals:   08/10/14 1003  BP: 150/70  Pulse: 70  Temp: 96.9 F (36.1 C)  Resp: 16   Constitutional: elderly  male in no acute distress. Appears stated age.  HEENT: Normocephalic and atraumatic. PERRL. EOM intact. No icterus.  Neck: Supple and nontender. No JVD or carotid bruits. Cardiac: Normal S1, S2. Chronically irregular without appreciable murmurs, rubs, or gallops. Lungs: Unlabored respiration. Breath sounds clear bilaterally without rales, rhonchi, or wheezing.  Abdomen: Audible bowel sounds in all quadrants. Soft, nontender, nondistended. No palpable mass.  Musculoskeletal: extremities ROM intact. Bil BKA Spine and Back: Normal spinal profile.  Skin: Warm and dry. Normal color and texture with no lesions or eruptions.  Neurological: Alert and oriented to person, place, and time.  Psychiatric: Judgment and insight adequate. Appropriate mood and affect.    Assessment & Plan 1. Essential hypertension -Increase frequency of hydralazine 25mg  twice daily to three times daily. Continue amlodipine 10mg  daily, clonidine 0.2mg /24hr transdermal patch once daily, and lopressor 100mg  twice daily. Educate patient regarding limited salt intake. Continue dialysis diet. Will continue to monitor his BPs. Nursing staff to report if his BPs continue to be elevated.    Family/Staff Communication Plan of care discuss with resident and professional staff members. Resident and professional staff members verbalize understanding and agree with plan of care. No additional  questions or concerns reported.    Loura BackKim Nguyen, MSN, [facility]NP-C [facility] [address] [place], [place] [address] [phone] [8am-5pm]

## 2014-08-23 DIAGNOSIS — D649 Anemia, unspecified: Secondary | ICD-10-CM | POA: Diagnosis not present

## 2014-08-23 LAB — CBC AND DIFFERENTIAL
HEMATOCRIT: 35 % — AB (ref 41–53)
HEMOGLOBIN: 11.1 g/dL — AB (ref 13.5–17.5)
PLATELETS: 221 10*3/uL (ref 150–399)
WBC: 6.2 10*3/mL

## 2014-08-23 LAB — BASIC METABOLIC PANEL
BUN: 18 mg/dL (ref 4–21)
Creatinine: 1.8 mg/dL — AB (ref 0.6–1.3)
Glucose: 122 mg/dL
Potassium: 4.6 mmol/L (ref 3.4–5.3)
SODIUM: 142 mmol/L (ref 137–147)

## 2014-08-23 LAB — HEPATIC FUNCTION PANEL
ALK PHOS: 74 U/L (ref 25–125)
ALT: 12 U/L (ref 10–40)
AST: 21 U/L (ref 14–40)
BILIRUBIN, TOTAL: 0.4 mg/dL

## 2014-09-01 DIAGNOSIS — N184 Chronic kidney disease, stage 4 (severe): Secondary | ICD-10-CM | POA: Diagnosis not present

## 2014-09-01 DIAGNOSIS — D6489 Other specified anemias: Secondary | ICD-10-CM | POA: Diagnosis not present

## 2014-09-09 ENCOUNTER — Non-Acute Institutional Stay (SKILLED_NURSING_FACILITY): Payer: Medicare Other | Admitting: Internal Medicine

## 2014-09-09 DIAGNOSIS — I10 Essential (primary) hypertension: Secondary | ICD-10-CM | POA: Diagnosis not present

## 2014-09-11 NOTE — Progress Notes (Addendum)
Patient ID: Marc LieuJimmy Wheeler, male   DOB: 10/05/1941, 73 y.o.   MRN: [id number]               PROGRESS NOTE  DATE:  09/08/2014    [facility]       LEVEL OF CARE:   SNF   Acute Visit   CHIEF COMPLAINT:  Review of marginally high blood pressures.    HISTORY OF PRESENT ILLNESS:  This is a 73 year-old man with a past history of type 2 diabetes, stage IV chronic kidney disease.    He also has significant hypertension and is on a multitude of medications including Lopressor 100 b.i.d., amlodipine 10 mg a day, ASA 81 q.d., clonidine transdermal 0.2 patch weekly, hydralazine 25 mg three times a day which I note was increased last month.    His blood pressures are reviewed today at the request of the staff.  In general, he appears to be mostly in the 150/70 range over the last 48 hours.  It is noteworthy over the weekend, however, that he was well into the higher 100s including one value of 197/112.  For a diabetic with chronic renal failure, this is suboptimal control.    The patient really does not have any complaints today.  He is not complaining of chest pain or shortness of breath.    His last creatinine earlier this month was 1.97.    ASSESSMENT/PLAN:    Essential hypertension.   I am going to increase the hydralazine to 50 mg three times a day.  With his creatinine, I would wonder about an ACE or an ARB.   He does apparently follow with Nephrology.  There is also room to increase the clonidine patch, but for now I will increase the hydralazine.

## 2014-09-21 ENCOUNTER — Non-Acute Institutional Stay (SKILLED_NURSING_FACILITY): Payer: Medicare Other | Admitting: Internal Medicine

## 2014-09-21 ENCOUNTER — Encounter: Payer: Self-pay | Admitting: Internal Medicine

## 2014-09-21 DIAGNOSIS — E1129 Type 2 diabetes mellitus with other diabetic kidney complication: Secondary | ICD-10-CM

## 2014-09-21 DIAGNOSIS — I10 Essential (primary) hypertension: Secondary | ICD-10-CM

## 2014-09-21 DIAGNOSIS — N184 Chronic kidney disease, stage 4 (severe): Secondary | ICD-10-CM

## 2014-09-21 DIAGNOSIS — E785 Hyperlipidemia, unspecified: Secondary | ICD-10-CM

## 2014-09-21 DIAGNOSIS — I2510 Atherosclerotic heart disease of native coronary artery without angina pectoris: Secondary | ICD-10-CM

## 2014-09-21 DIAGNOSIS — IMO0002 Reserved for concepts with insufficient information to code with codable children: Secondary | ICD-10-CM

## 2014-09-21 DIAGNOSIS — E162 Hypoglycemia, unspecified: Secondary | ICD-10-CM

## 2014-09-21 DIAGNOSIS — E1165 Type 2 diabetes mellitus with hyperglycemia: Secondary | ICD-10-CM

## 2014-09-21 MED ORDER — INSULIN GLARGINE 300 UNIT/ML ~~LOC~~ SOPN: 85.0000 [IU] | PEN_INJECTOR | Freq: Every day | SUBCUTANEOUS | Status: DC

## 2014-09-21 MED ORDER — HYDRALAZINE HCL 25 MG PO TABS: 25.0000 mg | ORAL_TABLET | Freq: Two times a day (BID) | ORAL | Status: DC

## 2014-09-21 NOTE — Progress Notes (Signed)
Patient ID: Marc Wheeler, male   DOB: 08/19/1941, 73 y.o.   MRN: [id number]  [facility]  [facility] Provider:  Gwenith Spitziffany L. Renato Gailseed, D.O., C.M.D.  Code Status:  Full code  Chief Complaint  Patient presents with  . Medical Management of Chronic Issues  . Hypoglycemia    HPI:  73 yo black male long term care resident with DMII uncontrolled, CKD III, PAD s/p bilateral BKA, htn, hyperlipidemia seen for med mgt of chronic diseases and due to hypoglycemia around lunch time recently due to decreased snacks given at facility (now only get hs snacks).  Review of Systems:  Review of Systems  Constitutional: Negative for fever and chills.  HENT: Negative for congestion and hearing loss.   Eyes: Negative for blurred vision.  Respiratory: Negative for shortness of breath.   Cardiovascular: Positive for leg swelling. Negative for chest pain and palpitations.  Gastrointestinal: Negative for abdominal pain and constipation.  Genitourinary: Negative for dysuria.  Musculoskeletal: Negative for myalgias and falls.  Skin: Negative for rash.  Neurological: Negative for dizziness.  Psychiatric/Behavioral: Negative for memory loss.    Medications: Patient's Medications  New Prescriptions   No medications on file  Previous Medications   ACETAMINOPHEN (TYLENOL) 325 MG TABLET    Take 650 mg by mouth every 4 (four) hours as needed for pain.   AMLODIPINE (NORVASC) 10 MG TABLET    Take 10 mg by mouth daily.   ASPIRIN EC 81 MG TABLET    Take 81 mg by mouth daily.   ATORVASTATIN (LIPITOR) 80 MG TABLET    Take 80 mg by mouth daily.   B COMPLEX-VITAMIN C-FOLIC ACID (NEPHRO-VITE) 0.8 MG TABS    Take 0.8 mg by mouth at bedtime.   CALCIUM CARB-CHOLECALCIFEROL (CALCIUM 500 +D) 500-400 MG-UNIT TABS    Take 1 tablet by mouth 2 (two) times daily.   CLONIDINE (CATAPRES - DOSED IN MG/24 HR) 0.2 MG/24HR PATCH    Place 1 patch onto the skin once a week.   DICLOFENAC SODIUM (VOLTAREN) 1 % GEL    Apply 2 g  topically 4 (four) times daily as needed (shoulder pain).   DOCUSATE SODIUM (COLACE) 100 MG CAPSULE    Take 100 mg by mouth 2 (two) times daily.   FISH OIL-OMEGA-3 FATTY ACIDS 1000 MG CAPSULE    Take 1 g by mouth daily.   HYDRALAZINE (APRESOLINE) 10 MG TABLET    Take 10 mg by mouth 2 (two) times daily.   INSULIN ASPART (NOVOLOG) 100 UNIT/ML INJECTION    Inject 32 Units into the skin 3 (three) times daily before meals.   INSULIN GLARGINE (LANTUS) 100 UNIT/ML INJECTION    Inject 45-55 Units into the skin 2 (two) times daily. Inject 45 units SQ at bedtime and 55 units SQ in the morning   LINAGLIPTIN (TRADJENTA) 5 MG TABS TABLET    Take 5 mg by mouth daily.   METOPROLOL (LOPRESSOR) 100 MG TABLET    Take 100 mg by mouth 2 (two) times daily.   POLYETHYL GLYCOL-PROPYL GLYCOL (SYSTANE) 0.4-0.3 % SOLN    Apply 1 drop to eye daily. Both eyes  Modified Medications   No medications on file  Discontinued Medications   No medications on file    Physical Exam: Filed Vitals:   09/21/14 1132  BP: 160/70  Pulse: 78  Temp: 98 F (36.7 C)  Resp: 20  Height: 4\' 4"  (1.321 m)  Weight: 238 lb (107.956 kg)  SpO2: 98%  Physical Exam  Constitutional: He is oriented to person, place, and time. No distress.  Cardiovascular: Normal rate, regular rhythm and normal heart sounds.   Pulmonary/Chest: Effort normal and breath sounds normal. No respiratory distress.  Abdominal: Soft. Bowel sounds are normal. He exhibits no distension and no mass. There is no tenderness.  Musculoskeletal: Normal range of motion.  Bilateral bkas  Neurological: He is alert and oriented to person, place, and time.  Skin: Skin is warm and dry.  Psychiatric: He has a normal mood and affect.    Labs reviewed: Basic Metabolic Panel:  Recent Labs  45/40/9809/12/06  NA 142  K 4.6  BUN 18  CREATININE 1.8*    Liver Function Tests:  Recent Labs  08/23/14  AST 21  ALT 12  ALKPHOS 74   Iron 80, UIBC 149, TIBC 229, %sat 35 Ca 9.3, alb  3.6, phos 3.7, PTH 60  Assessment/Plan 1. DM (diabetes mellitus), type 2, uncontrolled, with renal complications Has had difficulty with hypoglycemia and very low normal glucose levels recently since snack availablity has been decreased to bedtime only -due to lantus bid dosing at present and use of 100 units, will d/c lantus and start him on toujeo at reduced dose of 85 units daily -also will reduce novolog a little bit from 32 to 30 units at meals  2. Essential hypertension, benign Increase hydralazine to 50mg  po qid from tid to see if that helps   3. Chronic kidney disease (CKD), stage IV (severe) Stable on recent bmp since his glucose is finally under better control Continue to monitor and avoid nsaids  4. Coronary artery disease involving native coronary artery of native heart without angina pectoris Stable, no recent symptoms, cont bp, lipid and glucose control  5. Hyperlipidemia LDL goal <100 -cont statin therapy, apparently recent lipids not abstracted and chart not readily available today  6. Hypoglycemia -will stop lantus (getting 55 in am and 45 at hs) -start toujeo 85 units in am -decrease novolog also to 30 units tid ac meals  Family/ staff Communication: pt was seen with unit supervisor  Goals of care: long term care resident, full code  Labs/tests ordered:  No new today

## 2014-09-22 MED ORDER — HYDRALAZINE HCL 50 MG PO TABS: 50.0000 mg | ORAL_TABLET | Freq: Four times a day (QID) | ORAL | Status: DC

## 2014-09-23 DIAGNOSIS — N183 Chronic kidney disease, stage 3 (moderate): Secondary | ICD-10-CM | POA: Diagnosis not present

## 2014-09-23 DIAGNOSIS — D631 Anemia in chronic kidney disease: Secondary | ICD-10-CM | POA: Diagnosis not present

## 2014-09-23 DIAGNOSIS — R809 Proteinuria, unspecified: Secondary | ICD-10-CM | POA: Diagnosis not present

## 2014-09-23 DIAGNOSIS — I129 Hypertensive chronic kidney disease with stage 1 through stage 4 chronic kidney disease, or unspecified chronic kidney disease: Secondary | ICD-10-CM | POA: Diagnosis not present

## 2014-10-06 DIAGNOSIS — N189 Chronic kidney disease, unspecified: Secondary | ICD-10-CM | POA: Diagnosis not present

## 2014-11-01 ENCOUNTER — Non-Acute Institutional Stay (SKILLED_NURSING_FACILITY): Payer: Medicare Other | Admitting: Adult Health

## 2014-11-01 DIAGNOSIS — IMO0002 Reserved for concepts with insufficient information to code with codable children: Secondary | ICD-10-CM

## 2014-11-01 DIAGNOSIS — E785 Hyperlipidemia, unspecified: Secondary | ICD-10-CM

## 2014-11-01 DIAGNOSIS — E1122 Type 2 diabetes mellitus with diabetic chronic kidney disease: Secondary | ICD-10-CM | POA: Diagnosis not present

## 2014-11-01 DIAGNOSIS — K5901 Slow transit constipation: Secondary | ICD-10-CM

## 2014-11-01 DIAGNOSIS — E1365 Other specified diabetes mellitus with hyperglycemia: Secondary | ICD-10-CM

## 2014-11-01 DIAGNOSIS — N189 Chronic kidney disease, unspecified: Secondary | ICD-10-CM

## 2014-11-01 DIAGNOSIS — N183 Chronic kidney disease, stage 3 (moderate): Secondary | ICD-10-CM | POA: Diagnosis not present

## 2014-11-01 DIAGNOSIS — I129 Hypertensive chronic kidney disease with stage 1 through stage 4 chronic kidney disease, or unspecified chronic kidney disease: Secondary | ICD-10-CM | POA: Diagnosis not present

## 2014-11-01 DIAGNOSIS — N185 Chronic kidney disease, stage 5: Secondary | ICD-10-CM | POA: Diagnosis not present

## 2014-11-01 DIAGNOSIS — N181 Chronic kidney disease, stage 1: Secondary | ICD-10-CM

## 2014-11-01 DIAGNOSIS — E11359 Type 2 diabetes mellitus with proliferative diabetic retinopathy without macular edema: Secondary | ICD-10-CM

## 2014-11-01 DIAGNOSIS — Z89511 Acquired absence of right leg below knee: Secondary | ICD-10-CM | POA: Diagnosis not present

## 2014-11-01 DIAGNOSIS — M19012 Primary osteoarthritis, left shoulder: Secondary | ICD-10-CM

## 2014-11-01 DIAGNOSIS — Z89512 Acquired absence of left leg below knee: Secondary | ICD-10-CM | POA: Diagnosis not present

## 2014-11-01 DIAGNOSIS — E1329 Other specified diabetes mellitus with other diabetic kidney complication: Secondary | ICD-10-CM | POA: Diagnosis not present

## 2014-11-01 DIAGNOSIS — E1322 Other specified diabetes mellitus with diabetic chronic kidney disease: Secondary | ICD-10-CM

## 2014-11-01 DIAGNOSIS — N184 Chronic kidney disease, stage 4 (severe): Secondary | ICD-10-CM

## 2014-11-01 DIAGNOSIS — E113599 Type 2 diabetes mellitus with proliferative diabetic retinopathy without macular edema, unspecified eye: Secondary | ICD-10-CM

## 2014-11-01 DIAGNOSIS — N182 Chronic kidney disease, stage 2 (mild): Secondary | ICD-10-CM

## 2014-11-03 ENCOUNTER — Other Ambulatory Visit: Payer: Self-pay | Admitting: Internal Medicine

## 2014-11-03 DIAGNOSIS — N189 Chronic kidney disease, unspecified: Secondary | ICD-10-CM | POA: Diagnosis not present

## 2014-11-03 DIAGNOSIS — E0822 Diabetes mellitus due to underlying condition with diabetic chronic kidney disease: Secondary | ICD-10-CM | POA: Diagnosis not present

## 2014-11-03 LAB — HEPATIC FUNCTION PANEL
ALT: 22 U/L (ref 0–53)
AST: 20 U/L (ref 0–37)
Albumin: 3.6 g/dL (ref 3.5–5.2)
Alkaline Phosphatase: 69 U/L (ref 39–117)
Bilirubin, Direct: 0.1 mg/dL (ref 0.0–0.3)
Indirect Bilirubin: 0.3 mg/dL (ref 0.2–1.2)
Total Bilirubin: 0.4 mg/dL (ref 0.2–1.2)
Total Protein: 6.9 g/dL (ref 6.0–8.3)

## 2014-11-03 LAB — HEMOGLOBIN A1C
Hgb A1c MFr Bld: 8.2 % — ABNORMAL HIGH (ref <5.7)
Mean Plasma Glucose: 189 mg/dL — ABNORMAL HIGH (ref <117)

## 2014-11-03 LAB — LIPID PANEL
Cholesterol: 98 mg/dL (ref 0–200)
HDL: 22 mg/dL — ABNORMAL LOW (ref >39)
LDL Cholesterol: 45 mg/dL (ref 0–99)
Total CHOL/HDL Ratio: 4.5 Ratio
Triglycerides: 153 mg/dL — ABNORMAL HIGH (ref <150)
VLDL: 31 mg/dL (ref 0–40)

## 2014-11-14 DIAGNOSIS — Z89512 Acquired absence of left leg below knee: Secondary | ICD-10-CM

## 2014-11-14 DIAGNOSIS — Z89511 Acquired absence of right leg below knee: Secondary | ICD-10-CM | POA: Insufficient documentation

## 2014-11-14 NOTE — Progress Notes (Signed)
Patient ID: Marc Wheeler, male   DOB: 1941/05/27, 74 y.o.   MRN: [id number]  [name]      No Known Allergies     Chief Complaint  Patient presents with  . Medical Management of Chronic Issues    HPI:  He is a long term resident of this facility being seen for the management of his chronic illnesses. Overall his status remains without change. He is not voicing any concerns or complaints at this time. There are no nursing concerns at this time.   Past Medical History  Diagnosis Date  . Diabetes mellitus without complication   . Hyperlipidemia   . Hypertension   . Chronic kidney disease   . Edema   . Depression   . Diabetic retinopathy     Past Surgical History  Procedure Laterality Date  . Leg amputation below knee Bilateral   . Right retinal detachment repair  VITAL SIGNS BP 144/72 mmHg  Pulse 73  Ht  (1.321 m)  Wt 241 lb (109.317 kg)  BMI 62.64 kg/m2  SpO2 98%   Outpatient Encounter Prescriptions as of 11/01/2014  Medication Sig  . acetaminophen (TYLENOL) 325 MG tablet Take 650 mg by mouth every 4 (four) hours as needed for pain.  Marland Kitchen amLODipine (NORVASC) 10 MG tablet Take 10 mg by mouth daily.  Marland Kitchen aspirin EC 81 MG tablet Take 81 mg by mouth daily.  Marland Kitchen atorvastatin (LIPITOR) 80 MG tablet Take 80 mg by mouth daily.  Marland Kitchen b complex-vitamin c-folic acid (NEPHRO-VITE) 0.8 MG TABS Take 0.8 mg by mouth at bedtime.  . Calcium Carb-Cholecalciferol (CALCIUM 500 +D) 500-400 MG-UNIT TABS Take 1 tablet by mouth 2 (two) times daily.  . cloNIDine (CATAPRES - DOSED IN MG/24 HR) 0.2 mg/24hr patch Place 1 patch onto the skin once a week.  . diclofenac sodium (VOLTAREN) 1 % GEL Apply 2 g topically 4 (four) times daily as needed (shoulder pain).  Marland Kitchen docusate sodium (COLACE) 100 MG capsule Take 100 mg by mouth 2 (two) times daily.  . fish oil-omega-3 fatty acids 1000 MG capsule Take 1 g by mouth daily.  . hydrALAZINE (APRESOLINE) 50 MG tablet Take 1 tablet  (50 mg total) by mouth 4 (four) times daily.  . insulin aspart (NOVOLOG) 100 UNIT/ML injection Inject 30 Units into the skin 3 (three) times daily before meals.  . Insulin Glargine (TOUJEO SOLOSTAR) 300 UNIT/ML SOPN Inject 85 Units into the skin daily.  Marland Kitchen linagliptin (TRADJENTA) 5 MG TABS tablet Take 5 mg by mouth daily.  . metoprolol (LOPRESSOR) 100 MG tablet Take 100 mg by mouth 2 (two) times daily.  Bertram Gala Glycol-Propyl Glycol (SYSTANE) 0.4-0.3 % SOLN Apply 1 drop to eye daily. Both eyes     SIGNIFICANT DIAGNOSTIC EXAMS    LABS REVIEWED:   08-30-14: urine culture: hafnia alvei: cipro 09-01-14: wbc 6.7 ;hgb 10.8; hct 34; mcv 84.1; plt 183; glucose 128; bun 27; creat 1.97; k+4.7; na++143; phos 3.5; iron 60; tibc 237; ferritin 367 10-06-14: glucose 166; bun 41; creat 2.5; k+4.7; na++143     Review of Systems  Constitutional: Negative for malaise/fatigue.  Respiratory: Negative for cough and shortness of breath.   Cardiovascular: Negative for chest pain and palpitations.  Gastrointestinal: Negative for heartburn, abdominal pain and constipation.  Musculoskeletal: Negative for myalgias and back pain.  Skin: Negative.   Neurological: Negative for headaches.  Psychiatric/Behavioral: Negative for depression. The patient is not nervous/anxious.  Physical Exam  Constitutional: He is oriented to person, place, and time. He appears well-developed and well-nourished. No distress.  Obese   Neck: Neck supple. No JVD present. No thyromegaly present.  Cardiovascular: Normal rate and regular rhythm.   Respiratory: Effort normal and breath sounds normal. No respiratory distress.  GI: Soft. Bowel sounds are normal. He exhibits no distension. There is no tenderness.  Musculoskeletal:  Bilateral bka;  Is able to move upper extremities   Neurological: He is alert and oriented to person, place, and time.  Skin: Skin is warm and dry. He is not diaphoretic.       ASSESSMENT/  PLAN:  1. Diabetes: will continue tuojeo 85 units daily; novolog 30 units with meals; tradjenta 5 mg daily; asa 81 mg daily; will monitor   2. Hypertensive renal disease: will continue norvasc 10 mg daily; lopressor 100 mg twice daily; clonidine 0.2 mg patch changed weekly; lopressor 100 mg twice daily; hydralazine 50 mg four times daily; asa 81 mg daily   3. Dyslipidemia: will continue lipitor 80 mg daily; fish oil 1 gm daily  4. 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 Diabetic retinopathy: is on systane eye drop to both eyes is followed by eye doctor   6. Osteoarthritis: is stable; will continue voltaren gel to left should four times daily as needed   7. Constipation: will continue colace twice daily   8. Bilateral bka: without change   Will check  hgb a1c lipids and liver function       Synthia Innocenteborah Green NP [facility]  Contact [phone] Monday through Friday 8am- 5pm  After hours call [phone]

## 2014-11-29 ENCOUNTER — Other Ambulatory Visit: Payer: Self-pay | Admitting: Internal Medicine

## 2014-11-29 DIAGNOSIS — N189 Chronic kidney disease, unspecified: Secondary | ICD-10-CM | POA: Diagnosis not present

## 2014-11-29 LAB — CBC
HCT: 34.8 % — ABNORMAL LOW (ref 39.0–52.0)
Hemoglobin: 10.7 g/dL — ABNORMAL LOW (ref 13.0–17.0)
MCH: 25.9 pg — ABNORMAL LOW (ref 26.0–34.0)
MCHC: 30.7 g/dL (ref 30.0–36.0)
MCV: 84.3 fL (ref 78.0–100.0)
Platelets: 199 10*3/uL (ref 150–400)
RBC: 4.13 MIL/uL — ABNORMAL LOW (ref 4.22–5.81)
RDW: 16.1 % — ABNORMAL HIGH (ref 11.5–15.5)
WBC: 6.3 10*3/uL (ref 4.0–10.5)

## 2014-11-29 LAB — COMPREHENSIVE METABOLIC PANEL
ALT: 17 U/L (ref 0–53)
AST: 15 U/L (ref 0–37)
Albumin: 3.7 g/dL (ref 3.5–5.2)
Alkaline Phosphatase: 71 U/L (ref 39–117)
BUN: 31 mg/dL — ABNORMAL HIGH (ref 6–23)
CO2: 21 mEq/L (ref 19–32)
Calcium: 9.1 mg/dL (ref 8.4–10.5)
Chloride: 106 mEq/L (ref 96–112)
Creat: 2.29 mg/dL — ABNORMAL HIGH (ref 0.50–1.35)
Glucose, Bld: 146 mg/dL — ABNORMAL HIGH (ref 70–99)
Potassium: 4 mEq/L (ref 3.5–5.3)
Sodium: 140 mEq/L (ref 135–145)
Total Bilirubin: 0.3 mg/dL (ref 0.2–1.2)
Total Protein: 6.9 g/dL (ref 6.0–8.3)

## 2014-11-29 LAB — IRON AND TIBC
%SAT: 27 % (ref 20–55)
Iron: 57 ug/dL (ref 42–165)
TIBC: 211 ug/dL — ABNORMAL LOW (ref 215–435)
UIBC: 154 ug/dL (ref 125–400)

## 2014-11-29 LAB — PHOSPHORUS: Phosphorus: 3.3 mg/dL (ref 2.3–4.6)

## 2014-11-29 LAB — FERRITIN: Ferritin: 376 ng/mL — ABNORMAL HIGH (ref 22–322)

## 2014-11-30 ENCOUNTER — Encounter: Payer: Self-pay | Admitting: Internal Medicine

## 2014-11-30 ENCOUNTER — Non-Acute Institutional Stay (SKILLED_NURSING_FACILITY): Payer: Medicare Other | Admitting: Internal Medicine

## 2014-11-30 DIAGNOSIS — N183 Chronic kidney disease, stage 3 (moderate): Secondary | ICD-10-CM | POA: Diagnosis not present

## 2014-11-30 DIAGNOSIS — I10 Essential (primary) hypertension: Secondary | ICD-10-CM | POA: Diagnosis not present

## 2014-11-30 DIAGNOSIS — E1329 Other specified diabetes mellitus with other diabetic kidney complication: Secondary | ICD-10-CM | POA: Diagnosis not present

## 2014-11-30 DIAGNOSIS — M150 Primary generalized (osteo)arthritis: Secondary | ICD-10-CM | POA: Diagnosis not present

## 2014-11-30 DIAGNOSIS — E785 Hyperlipidemia, unspecified: Secondary | ICD-10-CM

## 2014-11-30 DIAGNOSIS — Z89512 Acquired absence of left leg below knee: Secondary | ICD-10-CM | POA: Diagnosis not present

## 2014-11-30 DIAGNOSIS — IMO0002 Reserved for concepts with insufficient information to code with codable children: Secondary | ICD-10-CM

## 2014-11-30 DIAGNOSIS — Z89511 Acquired absence of right leg below knee: Secondary | ICD-10-CM

## 2014-11-30 DIAGNOSIS — M159 Polyosteoarthritis, unspecified: Secondary | ICD-10-CM | POA: Insufficient documentation

## 2014-11-30 DIAGNOSIS — E1322 Other specified diabetes mellitus with diabetic chronic kidney disease: Secondary | ICD-10-CM

## 2014-11-30 DIAGNOSIS — E1365 Other specified diabetes mellitus with hyperglycemia: Principal | ICD-10-CM

## 2014-11-30 LAB — PTH, INTACT AND CALCIUM
Calcium: 9.4 mg/dL (ref 8.4–10.5)
PTH: 97 pg/mL — ABNORMAL HIGH (ref 14–64)

## 2014-12-01 LAB — URINALYSIS W MICROSCOPIC + REFLEX CULTURE
Bilirubin Urine: NEGATIVE
Casts: NONE SEEN
Crystals: NONE SEEN
Glucose, UA: NEGATIVE mg/dL
Hgb urine dipstick: NEGATIVE
Ketones, ur: NEGATIVE mg/dL
Leukocytes, UA: NEGATIVE
Nitrite: NEGATIVE
Protein, ur: NEGATIVE mg/dL
Specific Gravity, Urine: 1.012 (ref 1.005–1.030)
Squamous Epithelial / LPF: NONE SEEN
Urobilinogen, UA: 0.2 mg/dL (ref 0.0–1.0)
pH: 5.5 (ref 5.0–8.0)

## 2014-12-01 LAB — CREATININE, URINE, RANDOM: Creatinine, Urine: 97.2 mg/dL

## 2014-12-01 LAB — PROTEIN, URINE, RANDOM: Total Protein, Urine: 7 mg/dL (ref 5–25)

## 2014-12-18 NOTE — Progress Notes (Signed)
Patient ID: Marc Wheeler, male   DOB: 1941/10/14, 73 y.o.   MRN: [id number]    [facility]     Place of Service: SNF (31)    No Known Allergies  Chief Complaint  Patient presents with  . Medical Management of Chronic Issues    HPI:  74 yo male long term resident seen today for above. He c/o that he does not like the food selections and would like to speak with dietary. He does eat some of the meals that comes to him. No other concerns. No low BS reactions. CBG 194 today. Denies CP,SOB, palpitations, HA, dizziness, N/V, change in bowel/bladder habits.  CODE STATUS: FULL  Past Medical History  Diagnosis Date  . Diabetes mellitus without complication   . Hyperlipidemia   . Hypertension   . Chronic kidney disease   . Edema   . Depression   . Diabetic retinopathy       Medications: Patient's Medications  New Prescriptions   No medications on file  Previous Medications   ACETAMINOPHEN (TYLENOL) 325 MG TABLET    Take 650 mg by mouth every 4 (four) hours as needed for pain.   AMLODIPINE (NORVASC) 10 MG TABLET    Take 10 mg by mouth daily.   ASPIRIN EC 81 MG TABLET    Take 81 mg by mouth daily.   ATORVASTATIN (LIPITOR) 80 MG TABLET    Take 80 mg by mouth daily.   B COMPLEX-VITAMIN C-FOLIC ACID (NEPHRO-VITE) 0.8 MG TABS    Take 0.8 mg by mouth at bedtime.   CALCIUM CARB-CHOLECALCIFEROL (CALCIUM 500 +D) 500-400 MG-UNIT TABS    Take 1 tablet by mouth 2 (two) times daily.   CLONIDINE (CATAPRES - DOSED IN MG/24 HR) 0.2 MG/24HR PATCH    Place 1 patch onto the skin once a week.   DICLOFENAC SODIUM (VOLTAREN) 1 % GEL    Apply 2 g topically 4 (four) times daily as needed (shoulder pain).   DOCUSATE SODIUM (COLACE) 100 MG CAPSULE    Take 100 mg by mouth 2 (two) times daily.   FISH OIL-OMEGA-3 FATTY ACIDS 1000 MG CAPSULE    Take 1 g by mouth daily.   FUROSEMIDE (LASIX) 40 MG TABLET    Take 40 mg by mouth daily.   HYDRALAZINE (APRESOLINE) 50 MG TABLET    Take 1  tablet (50 mg total) by mouth 4 (four) times daily.   INSULIN ASPART (NOVOLOG) 100 UNIT/ML INJECTION    Inject 30 Units into the skin 3 (three) times daily before meals.   INSULIN GLARGINE (TOUJEO SOLOSTAR) 300 UNIT/ML SOPN    Inject 85 Units into the skin daily.   LINAGLIPTIN (TRADJENTA) 5 MG TABS TABLET    Take 5 mg by mouth daily.   METOPROLOL (LOPRESSOR) 100 MG TABLET    Take 100 mg by mouth 2 (two) times daily.   POLYETHYL GLYCOL-PROPYL GLYCOL (SYSTANE) 0.4-0.3 % SOLN    Apply 1 drop to eye daily. Both eyes  Modified Medications   No medications on file  Discontinued Medications   No medications on file     Review of Systems  Constitutional: Negative for chills, activity change and fatigue.  HENT: Negative for sore throat and trouble swallowing.   Eyes: Negative for visual disturbance.  Respiratory: Negative for cough, chest tightness and shortness of breath.   Cardiovascular: Negative for chest pain, palpitations and leg swelling.  Gastrointestinal: Negative for nausea, vomiting, abdominal pain and blood in stool.  Genitourinary:  Negative for urgency, frequency and difficulty urinating.  Musculoskeletal: Positive for arthralgias and gait problem.  Skin: Negative for rash.  Neurological: Negative for weakness and headaches.  Psychiatric/Behavioral: Negative for confusion and sleep disturbance. The patient is not nervous/anxious.     Filed Vitals:   11/30/14 1707  BP: 130/68  Pulse: 66  Temp: 97.6 F (36.4 C)  Weight: 230 lb (104.327 kg)  SpO2: 98%   Body mass index is 59.78 kg/(m^2).  Physical Exam  Constitutional: He is oriented to person, place, and time. He appears well-developed and well-nourished.  Sitting in w/c  HENT:  Mouth/Throat: Oropharynx is clear and moist.  Eyes: Pupils are equal, round, and reactive to light. No scleral icterus.  Neck: Neck supple. No thyromegaly present.  Cardiovascular: Normal rate, regular rhythm, normal heart sounds and intact  distal pulses.  Exam reveals no gallop and no friction rub.   No murmur heard. Pulmonary/Chest: Effort normal and breath sounds normal. He has no wheezes. He has no rales. He exhibits no tenderness.  Abdominal: Soft. Bowel sounds are normal. He exhibits no distension and no mass. There is no tenderness. There is no rebound and no guarding.  Musculoskeletal:  B/l BKA; no stump secondary signs of infection  Lymphadenopathy:    He has no cervical adenopathy.  Neurological: He is alert and oriented to person, place, and time. He has normal reflexes.  Skin: Skin is warm and dry. No rash noted.  Psychiatric: He has a normal mood and affect. His behavior is normal. Judgment and thought content normal.     Labs reviewed: Orders Only on 11/29/2014  Component Date Value Ref Range Status  . Iron 11/29/2014 57  42 - 165 ug/dL Final  . UIBC 16/07/9603 154  125 - 400 ug/dL Final  . TIBC 54/06/8118 211* 215 - 435 ug/dL Final  . %SAT 14/78/2956 27  20 - 55 % Final  . WBC 11/29/2014 6.3  4.0 - 10.5 K/uL Final  . RBC 11/29/2014 4.13* 4.22 - 5.81 MIL/uL Final  . Hemoglobin 11/29/2014 10.7* 13.0 - 17.0 g/dL Final  . HCT 21/30/8657 34.8* 39.0 - 52.0 % Final  . MCV 11/29/2014 84.3  78.0 - 100.0 fL Final  . MCH 11/29/2014 25.9* 26.0 - 34.0 pg Final  . MCHC 11/29/2014 30.7  30.0 - 36.0 g/dL Final  . RDW 84/69/6295 16.1* 11.5 - 15.5 % Final  . Platelets 11/29/2014 199  150 - 400 K/uL Final  . MPV 11/29/2014 Not Performed  8.6 - 12.4 fL Final  . Sodium 11/29/2014 140  135 - 145 mEq/L Final  . Potassium 11/29/2014 4.0  3.5 - 5.3 mEq/L Final  . Chloride 11/29/2014 106  96 - 112 mEq/L Final  . CO2 11/29/2014 21  19 - 32 mEq/L Final  . Glucose, Bld 11/29/2014 146* 70 - 99 mg/dL Final  . BUN 28/41/3244 31* 6 - 23 mg/dL Final  . Creat 10/24/7251 2.29* 0.50 - 1.35 mg/dL Final  . Total Bilirubin 11/29/2014 0.3  0.2 - 1.2 mg/dL Final  . Alkaline Phosphatase 11/29/2014 71  39 - 117 U/L Final  . AST 11/29/2014 15   0 - 37 U/L Final  . ALT 11/29/2014 17  0 - 53 U/L Final  . Total Protein 11/29/2014 6.9  6.0 - 8.3 g/dL Final  . Albumin 66/44/0347 3.7  3.5 - 5.2 g/dL Final  . Calcium 42/59/5638 9.1  8.4 - 10.5 mg/dL Final  . Phosphorus 75/64/3329 3.3  2.3 - 4.6 mg/dL Final  .  Ferritin 11/29/2014 376* 22 - 322 ng/mL Final  Orders Only on 11/29/2014  Component Date Value Ref Range Status  . PTH 11/29/2014 97* 14 - 64 pg/mL Final  . Calcium 11/29/2014 9.4  8.4 - 10.5 mg/dL Final   Comment:   Interpretive Guide:                              Intact PTH               Calcium                              ----------               ------- Normal Parathyroid           Normal                   Normal Hypoparathyroidism           Low or Low Normal        Low Hyperparathyroidism      Primary                 Normal or High           High      Secondary               High                     Normal or Low      Tertiary                High                     High Non-Parathyroid   Hypercalcemia              Low or Low Normal        High   **Please note change in methodology. If re-baselining is needed, for patients who are being serially tested, please call customer service, within 3 days of collection, to request to add on the appropriate re-baselining test code for the previous methodology, at no charge.**   Orders Only on 11/29/2014  Component Date Value Ref Range Status  . Creatinine, Urine 11/29/2014 97.2   Final   No reference range established.  . Total Protein, Urine 11/29/2014 7  5 - 25 mg/dL Final  . Color, Urine 91/47/829502/05/2015 YELLOW  YELLOW Final  . APPearance 11/29/2014 CLEAR  CLEAR Final  . Specific Gravity, Urine 11/29/2014 1.012  1.005 - 1.030 Final  . pH 11/29/2014 5.5  5.0 - 8.0 Final  . Glucose, UA 11/29/2014 NEG  NEG mg/dL Final  . Bilirubin Urine 11/29/2014 NEG  NEG Final  . Ketones, ur 11/29/2014 NEG  NEG mg/dL Final  . Hgb urine dipstick 11/29/2014 NEG  NEG Final  . Protein, ur  11/29/2014 NEG  NEG mg/dL Final  . Urobilinogen, UA 11/29/2014 0.2  0.0 - 1.0 mg/dL Final  . Nitrite 62/13/086502/05/2015 NEG  NEG Final  . Leukocytes, UA 11/29/2014 NEG  NEG Final  . Squamous Epithelial / LPF 11/29/2014 NONE SEEN  RARE Final  . Crystals 11/29/2014 NONE SEEN  NONE SEEN Final  . Casts 11/29/2014 NONE SEEN  NONE SEEN Final  . WBC, UA 11/29/2014 0-2  <3 WBC/hpf Final  . RBC / HPF 11/29/2014 0-2  <  3 RBC/hpf Final  . Bacteria, UA 11/29/2014 MANY* RARE Final  Orders Only on 11/03/2014  Component Date Value Ref Range Status  . Cholesterol 11/03/2014 98  0 - 200 mg/dL Final   Comment: ATP III Classification:       < 200        mg/dL        Desirable      161 - 239     mg/dL        Borderline High      >= 240        mg/dL        High     . Triglycerides 11/03/2014 153* <150 mg/dL Final  . HDL 09/60/4540 22* >39 mg/dL Final  . Total CHOL/HDL Ratio 11/03/2014 4.5   Final  . VLDL 11/03/2014 31  0 - 40 mg/dL Final  . LDL Cholesterol 11/03/2014 45  0 - 99 mg/dL Final   Comment:   Total Cholesterol/HDL Ratio:CHD Risk                        Coronary Heart Disease Risk Table                                        Men       Women          1/2 Average Risk              3.4        3.3              Average Risk              5.0        4.4           2X Average Risk              9.6        7.1           3X Average Risk             23.4       11.0 Use the calculated Patient Ratio above and the CHD Risk table  to determine the patient's CHD Risk. ATP III Classification (LDL):       < 100        mg/dL         Optimal      981 - 129     mg/dL         Near or Above Optimal      130 - 159     mg/dL         Borderline High      160 - 189     mg/dL         High       > 191        mg/dL         Very High     . Total Bilirubin 11/03/2014 0.4  0.2 - 1.2 mg/dL Final  . Bilirubin, Direct 11/03/2014 0.1  0.0 - 0.3 mg/dL Final  . Indirect Bilirubin 11/03/2014 0.3  0.2 - 1.2 mg/dL Final  . Alkaline  Phosphatase 11/03/2014 69  39 - 117 U/L Final  . AST 11/03/2014 20  0 - 37 U/L Final  . ALT 11/03/2014 22  0 - 53 U/L  Final  . Total Protein 11/03/2014 6.9  6.0 - 8.3 g/dL Final  . Albumin 96/01/5408 3.6  3.5 - 5.2 g/dL Final  . Hgb W1X MFr Bld 11/03/2014 8.2* <5.7 % Final   Comment:                                                                        According to the ADA Clinical Practice Recommendations for 2011, when HbA1c is used as a screening test:     >=6.5%   Diagnostic of Diabetes Mellitus            (if abnormal result is confirmed)   5.7-6.4%   Increased risk of developing Diabetes Mellitus   References:Diagnosis and Classification of Diabetes Mellitus,Diabetes Care,2011,34(Suppl 1):S62-S69 and Standards of Medical Care in         Diabetes - 2011,Diabetes Care,2011,34 (Suppl 1):S11-S61.     . Mean Plasma Glucose 11/03/2014 189* <117 mg/dL Final   Chart reviewed   Assessment/Plan   ICD-9-CM ICD-10-CM   1. Uncontrolled secondary diabetes mellitus with stage 3 CKD (GFR 30-59) 249.41 E13.29    585.3 N18.3   2. Essential hypertension, benign - stable 401.1 I10   3. S/P bilateral BKA (below knee amputation) V49.75 Z89.512     Z89.511   4. Primary osteoarthritis involving multiple joints - pain controlled 715.09 M15.0   5. Hyperlipidemia LDL goal <100 controlled 272.4 E78.5     --obtain dietary referral to discuss meal selections  --check A1c in 3 mos  --continue current medications as ordered  --continue fluid restriction/renal diet  --check VS weekly and record  --will follow    Cohick S. Ancil Linsey  [facility] [address] [place], [place] [address] [phone] Office (Wednesdays and Fridays 8 AM - 5 PM) [phone] Cell (Monday-Friday 8 AM - 5 PM)

## 2014-12-27 ENCOUNTER — Non-Acute Institutional Stay (SKILLED_NURSING_FACILITY): Payer: Medicare Other | Admitting: Adult Health

## 2014-12-27 DIAGNOSIS — E1329 Other specified diabetes mellitus with other diabetic kidney complication: Secondary | ICD-10-CM

## 2014-12-27 DIAGNOSIS — E1365 Other specified diabetes mellitus with hyperglycemia: Secondary | ICD-10-CM

## 2014-12-27 DIAGNOSIS — I120 Hypertensive chronic kidney disease with stage 5 chronic kidney disease or end stage renal disease: Secondary | ICD-10-CM

## 2014-12-27 DIAGNOSIS — E785 Hyperlipidemia, unspecified: Secondary | ICD-10-CM

## 2014-12-27 DIAGNOSIS — N185 Chronic kidney disease, stage 5: Secondary | ICD-10-CM | POA: Diagnosis not present

## 2014-12-27 DIAGNOSIS — N183 Chronic kidney disease, stage 3 (moderate): Secondary | ICD-10-CM | POA: Diagnosis not present

## 2014-12-27 DIAGNOSIS — N182 Chronic kidney disease, stage 2 (mild): Secondary | ICD-10-CM | POA: Diagnosis not present

## 2014-12-27 DIAGNOSIS — IMO0002 Reserved for concepts with insufficient information to code with codable children: Secondary | ICD-10-CM

## 2014-12-27 DIAGNOSIS — K5901 Slow transit constipation: Secondary | ICD-10-CM | POA: Diagnosis not present

## 2014-12-27 DIAGNOSIS — E1122 Type 2 diabetes mellitus with diabetic chronic kidney disease: Secondary | ICD-10-CM | POA: Diagnosis not present

## 2014-12-27 DIAGNOSIS — N184 Chronic kidney disease, stage 4 (severe): Secondary | ICD-10-CM

## 2014-12-27 DIAGNOSIS — E11359 Type 2 diabetes mellitus with proliferative diabetic retinopathy without macular edema: Secondary | ICD-10-CM | POA: Diagnosis not present

## 2014-12-27 DIAGNOSIS — N181 Chronic kidney disease, stage 1: Secondary | ICD-10-CM | POA: Diagnosis not present

## 2014-12-27 DIAGNOSIS — N189 Chronic kidney disease, unspecified: Secondary | ICD-10-CM | POA: Diagnosis not present

## 2014-12-27 DIAGNOSIS — N19 Unspecified kidney failure: Secondary | ICD-10-CM

## 2014-12-27 DIAGNOSIS — E113599 Type 2 diabetes mellitus with proliferative diabetic retinopathy without macular edema, unspecified eye: Secondary | ICD-10-CM

## 2014-12-27 DIAGNOSIS — E1322 Other specified diabetes mellitus with diabetic chronic kidney disease: Secondary | ICD-10-CM

## 2015-01-01 ENCOUNTER — Encounter (HOSPITAL_COMMUNITY): Payer: Self-pay | Admitting: Emergency Medicine

## 2015-01-01 ENCOUNTER — Observation Stay (HOSPITAL_COMMUNITY)
Admission: EM | Admit: 2015-01-01 | Discharge: 2015-01-02 | Disposition: A | Discharge status: Home / Self Care | Payer: Medicare Other | Attending: Internal Medicine | Admitting: Internal Medicine

## 2015-01-01 ENCOUNTER — Emergency Department (HOSPITAL_COMMUNITY): Payer: Medicare Other

## 2015-01-01 DIAGNOSIS — E1329 Other specified diabetes mellitus with other diabetic kidney complication: Secondary | ICD-10-CM

## 2015-01-01 DIAGNOSIS — R109 Unspecified abdominal pain: Secondary | ICD-10-CM

## 2015-01-01 DIAGNOSIS — E1122 Type 2 diabetes mellitus with diabetic chronic kidney disease: Secondary | ICD-10-CM | POA: Diagnosis not present

## 2015-01-01 DIAGNOSIS — N189 Chronic kidney disease, unspecified: Secondary | ICD-10-CM | POA: Insufficient documentation

## 2015-01-01 DIAGNOSIS — Z794 Long term (current) use of insulin: Secondary | ICD-10-CM | POA: Diagnosis not present

## 2015-01-01 DIAGNOSIS — E1121 Type 2 diabetes mellitus with diabetic nephropathy: Secondary | ICD-10-CM | POA: Diagnosis present

## 2015-01-01 DIAGNOSIS — N182 Chronic kidney disease, stage 2 (mild): Secondary | ICD-10-CM | POA: Diagnosis not present

## 2015-01-01 DIAGNOSIS — R635 Abnormal weight gain: Secondary | ICD-10-CM | POA: Diagnosis not present

## 2015-01-01 DIAGNOSIS — I129 Hypertensive chronic kidney disease with stage 1 through stage 4 chronic kidney disease, or unspecified chronic kidney disease: Secondary | ICD-10-CM | POA: Diagnosis not present

## 2015-01-01 DIAGNOSIS — Z87891 Personal history of nicotine dependence: Secondary | ICD-10-CM | POA: Diagnosis not present

## 2015-01-01 DIAGNOSIS — I2510 Atherosclerotic heart disease of native coronary artery without angina pectoris: Secondary | ICD-10-CM | POA: Diagnosis present

## 2015-01-01 DIAGNOSIS — Z89512 Acquired absence of left leg below knee: Secondary | ICD-10-CM | POA: Diagnosis not present

## 2015-01-01 DIAGNOSIS — N183 Chronic kidney disease, stage 3 unspecified: Secondary | ICD-10-CM | POA: Diagnosis present

## 2015-01-01 DIAGNOSIS — Z89511 Acquired absence of right leg below knee: Secondary | ICD-10-CM | POA: Insufficient documentation

## 2015-01-01 DIAGNOSIS — I25119 Atherosclerotic heart disease of native coronary artery with unspecified angina pectoris: Secondary | ICD-10-CM | POA: Diagnosis not present

## 2015-01-01 DIAGNOSIS — E785 Hyperlipidemia, unspecified: Secondary | ICD-10-CM | POA: Insufficient documentation

## 2015-01-01 DIAGNOSIS — I209 Angina pectoris, unspecified: Secondary | ICD-10-CM | POA: Diagnosis present

## 2015-01-01 DIAGNOSIS — Z7982 Long term (current) use of aspirin: Secondary | ICD-10-CM | POA: Diagnosis not present

## 2015-01-01 DIAGNOSIS — N184 Chronic kidney disease, stage 4 (severe): Secondary | ICD-10-CM | POA: Diagnosis not present

## 2015-01-01 DIAGNOSIS — R079 Chest pain, unspecified: Secondary | ICD-10-CM | POA: Insufficient documentation

## 2015-01-01 DIAGNOSIS — N185 Chronic kidney disease, stage 5: Secondary | ICD-10-CM | POA: Diagnosis not present

## 2015-01-01 DIAGNOSIS — N181 Chronic kidney disease, stage 1: Secondary | ICD-10-CM | POA: Diagnosis not present

## 2015-01-01 DIAGNOSIS — E1322 Other specified diabetes mellitus with diabetic chronic kidney disease: Secondary | ICD-10-CM | POA: Diagnosis present

## 2015-01-01 DIAGNOSIS — E1365 Other specified diabetes mellitus with hyperglycemia: Secondary | ICD-10-CM

## 2015-01-01 DIAGNOSIS — IMO0002 Reserved for concepts with insufficient information to code with codable children: Secondary | ICD-10-CM | POA: Diagnosis present

## 2015-01-01 DIAGNOSIS — I10 Essential (primary) hypertension: Secondary | ICD-10-CM | POA: Diagnosis present

## 2015-01-01 DIAGNOSIS — F329 Major depressive disorder, single episode, unspecified: Secondary | ICD-10-CM | POA: Insufficient documentation

## 2015-01-01 LAB — CBC WITH DIFFERENTIAL/PLATELET
BASOS ABS: 0 10*3/uL (ref 0.0–0.1)
Basophils Relative: 1 % (ref 0–1)
EOS ABS: 0.2 10*3/uL (ref 0.0–0.7)
Eosinophils Relative: 2 % (ref 0–5)
HCT: 36.2 % — ABNORMAL LOW (ref 39.0–52.0)
HEMOGLOBIN: 11.7 g/dL — AB (ref 13.0–17.0)
LYMPHS ABS: 2.2 10*3/uL (ref 0.7–4.0)
LYMPHS PCT: 33 % (ref 12–46)
MCH: 26.8 pg (ref 26.0–34.0)
MCHC: 32.3 g/dL (ref 30.0–36.0)
MCV: 83 fL (ref 78.0–100.0)
MONO ABS: 0.7 10*3/uL (ref 0.1–1.0)
Monocytes Relative: 11 % (ref 3–12)
Neutro Abs: 3.5 10*3/uL (ref 1.7–7.7)
Neutrophils Relative %: 53 % (ref 43–77)
Platelets: 161 10*3/uL (ref 150–400)
RBC: 4.36 MIL/uL (ref 4.22–5.81)
RDW: 16.1 % — AB (ref 11.5–15.5)
WBC: 6.6 10*3/uL (ref 4.0–10.5)

## 2015-01-01 LAB — BASIC METABOLIC PANEL
ANION GAP: 7 (ref 5–15)
BUN: 29 mg/dL — AB (ref 6–23)
CALCIUM: 10 mg/dL (ref 8.4–10.5)
CO2: 27 mmol/L (ref 19–32)
Chloride: 109 mmol/L (ref 96–112)
Creatinine, Ser: 2.35 mg/dL — ABNORMAL HIGH (ref 0.50–1.35)
GFR calc Af Amer: 30 mL/min — ABNORMAL LOW (ref >90)
GFR calc non Af Amer: 26 mL/min — ABNORMAL LOW (ref >90)
Glucose, Bld: 72 mg/dL (ref 70–99)
Potassium: 3.7 mmol/L (ref 3.5–5.1)
SODIUM: 143 mmol/L (ref 135–145)

## 2015-01-01 LAB — GLUCOSE, CAPILLARY
Glucose-Capillary: 110 mg/dL — ABNORMAL HIGH (ref 70–99)
Glucose-Capillary: 60 mg/dL — ABNORMAL LOW (ref 70–99)

## 2015-01-01 LAB — I-STAT TROPONIN, ED: Troponin i, poc: 0.01 ng/mL (ref 0.00–0.08)

## 2015-01-01 LAB — CBC
HEMATOCRIT: 35.3 % — AB (ref 39.0–52.0)
HEMOGLOBIN: 11.5 g/dL — AB (ref 13.0–17.0)
MCH: 27.1 pg (ref 26.0–34.0)
MCHC: 32.6 g/dL (ref 30.0–36.0)
MCV: 83.1 fL (ref 78.0–100.0)
PLATELETS: 179 10*3/uL (ref 150–400)
RBC: 4.25 MIL/uL (ref 4.22–5.81)
RDW: 16.1 % — AB (ref 11.5–15.5)
WBC: 6.3 10*3/uL (ref 4.0–10.5)

## 2015-01-01 LAB — TROPONIN I

## 2015-01-01 LAB — CREATININE, SERUM
Creatinine, Ser: 2.37 mg/dL — ABNORMAL HIGH (ref 0.50–1.35)
GFR calc non Af Amer: 25 mL/min — ABNORMAL LOW (ref >90)
GFR, EST AFRICAN AMERICAN: 29 mL/min — AB (ref >90)

## 2015-01-01 LAB — BRAIN NATRIURETIC PEPTIDE: B Natriuretic Peptide: 85.4 pg/mL (ref 0.0–100.0)

## 2015-01-01 MED ORDER — LINAGLIPTIN 5 MG PO TABS: 5.0000 mg | ORAL_TABLET | Freq: Every day | ORAL | Status: DC

## 2015-01-01 MED ORDER — ACETAMINOPHEN 325 MG PO TABS: 650.0000 mg | ORAL_TABLET | ORAL | Status: DC | PRN

## 2015-01-01 MED ORDER — DEXTROSE 50 % IV SOLN
INTRAVENOUS | Status: AC
  Administered 2015-01-01: 50 mL
  Administered 2015-01-02: 06:00:00
  Filled 2015-01-01: qty 50

## 2015-01-01 MED ORDER — ASPIRIN 81 MG PO CHEW
81.0000 mg | CHEWABLE_TABLET | Freq: Once | ORAL | Status: AC
  Administered 2015-01-01: 81 mg via ORAL
  Filled 2015-01-01: qty 1

## 2015-01-01 MED ORDER — ATORVASTATIN CALCIUM 80 MG PO TABS
80.0000 mg | ORAL_TABLET | Freq: Every day | ORAL | Status: DC
  Administered 2015-01-01 – 2015-01-02 (×2): 80 mg via ORAL
  Filled 2015-01-01 (×2): qty 1

## 2015-01-01 MED ORDER — INSULIN GLARGINE 300 UNIT/ML ~~LOC~~ SOPN: 85.0000 [IU] | PEN_INJECTOR | Freq: Every day | SUBCUTANEOUS | Status: DC

## 2015-01-01 MED ORDER — GI COCKTAIL ~~LOC~~
30.0000 mL | Freq: Two times a day (BID) | ORAL | Status: DC | PRN
  Filled 2015-01-01: qty 30

## 2015-01-01 MED ORDER — HYDRALAZINE HCL 50 MG PO TABS
50.0000 mg | ORAL_TABLET | Freq: Four times a day (QID) | ORAL | Status: DC
  Administered 2015-01-01 – 2015-01-02 (×3): 50 mg via ORAL
  Filled 2015-01-01 (×5): qty 1

## 2015-01-01 MED ORDER — HEPARIN SODIUM (PORCINE) 5000 UNIT/ML IJ SOLN
5000.0000 [IU] | Freq: Three times a day (TID) | INTRAMUSCULAR | Status: DC
  Administered 2015-01-01 – 2015-01-02 (×3): 5000 [IU] via SUBCUTANEOUS
  Filled 2015-01-01 (×3): qty 1

## 2015-01-01 MED ORDER — FUROSEMIDE 40 MG PO TABS: 40.0000 mg | ORAL_TABLET | Freq: Every day | ORAL | Status: DC

## 2015-01-01 MED ORDER — AMLODIPINE BESYLATE 10 MG PO TABS
10.0000 mg | ORAL_TABLET | Freq: Every day | ORAL | Status: DC
  Administered 2015-01-02: 10 mg via ORAL
  Filled 2015-01-01: qty 1

## 2015-01-01 MED ORDER — POLYVINYL ALCOHOL 1.4 % OP SOLN
1.0000 [drp] | Freq: Every day | OPHTHALMIC | Status: DC
  Administered 2015-01-02: 1 [drp] via OPHTHALMIC
  Filled 2015-01-01: qty 15

## 2015-01-01 MED ORDER — ASPIRIN EC 325 MG PO TBEC
325.0000 mg | DELAYED_RELEASE_TABLET | Freq: Every day | ORAL | Status: DC
  Administered 2015-01-02: 325 mg via ORAL
  Filled 2015-01-01: qty 1

## 2015-01-01 MED ORDER — ONDANSETRON HCL 4 MG/2ML IJ SOLN
4.0000 mg | Freq: Four times a day (QID) | INTRAMUSCULAR | Status: DC | PRN
Start: 2015-01-01 — End: 2015-01-02

## 2015-01-01 MED ORDER — NITROGLYCERIN 0.4 MG SL SUBL: 0.4000 mg | SUBLINGUAL_TABLET | SUBLINGUAL | Status: DC | PRN

## 2015-01-01 MED ORDER — METOPROLOL TARTRATE 100 MG PO TABS
100.0000 mg | ORAL_TABLET | Freq: Two times a day (BID) | ORAL | Status: DC
  Administered 2015-01-01 – 2015-01-02 (×2): 100 mg via ORAL
  Filled 2015-01-01 (×3): qty 1

## 2015-01-01 MED ORDER — CLONIDINE HCL 0.2 MG/24HR TD PTWK: 0.2000 mg | MEDICATED_PATCH | TRANSDERMAL | Status: DC

## 2015-01-01 MED ORDER — ASPIRIN 81 MG PO CHEW
162.0000 mg | CHEWABLE_TABLET | Freq: Once | ORAL | Status: AC
  Administered 2015-01-01: 162 mg via ORAL
  Filled 2015-01-01: qty 2

## 2015-01-01 MED ORDER — INSULIN ASPART 100 UNIT/ML ~~LOC~~ SOLN: 0.0000 [IU] | SUBCUTANEOUS | Status: DC

## 2015-01-01 MED ORDER — SODIUM CHLORIDE 0.9 % IV SOLN: INTRAVENOUS | Status: DC

## 2015-01-01 MED ORDER — DOCUSATE SODIUM 100 MG PO CAPS
100.0000 mg | ORAL_CAPSULE | Freq: Two times a day (BID) | ORAL | Status: DC
  Administered 2015-01-02: 100 mg via ORAL
  Filled 2015-01-01 (×3): qty 1

## 2015-01-01 MED ORDER — ALUM & MAG HYDROXIDE-SIMETH 200-200-20 MG/5ML PO SUSP
15.0000 mL | Freq: Once | ORAL | Status: AC
  Administered 2015-01-01: 15 mL via ORAL
  Filled 2015-01-01: qty 30

## 2015-01-01 MED ORDER — MORPHINE SULFATE 4 MG/ML IJ SOLN: 4.0000 mg | Freq: Once | INTRAMUSCULAR | Status: DC

## 2015-01-01 MED ORDER — ASPIRIN 81 MG PO CHEW: 324.0000 mg | CHEWABLE_TABLET | Freq: Once | ORAL | Status: DC

## 2015-01-01 MED ORDER — ASPIRIN EC 81 MG PO TBEC: 81.0000 mg | DELAYED_RELEASE_TABLET | Freq: Every day | ORAL | Status: DC

## 2015-01-01 MED ORDER — DEXTROSE-NACL 5-0.45 % IV SOLN
INTRAVENOUS | Status: DC
Start: 2015-01-01 — End: 2015-01-02
  Administered 2015-01-01: via INTRAVENOUS

## 2015-01-01 MED ORDER — HYDRALAZINE HCL 20 MG/ML IJ SOLN: 10.0000 mg | INTRAMUSCULAR | Status: DC | PRN

## 2015-01-01 NOTE — H&P (Signed)
[facility] History and Physical  Damel Querry RUE:[id number] DOB: 02/18/1941 DOA: 01/01/2015   Referring physician:  PCP: Kirt Boys, DO   Chief Complaint: Chest Pain  HPI: Nassir Neidert is a 74 y.o. male with a past medical history of insulin-dependent diabetes mellitus, hypertension, chronic kidney disease, status post bilateral below-the-knee amputation, presenting to the emergency department with complaints of chest pain. He reports being in his usual state health, developed left-sided chest pain at approximately 10:30 this morning, characterized as constant, achy, nonradiating, not associate with nausea or physical exertion. Symptoms improved by the time he reached the emergency room and is currently chest pain-free on my evaluation. Initial workup included a troponin and EKG which were negative. Patient reports having right-sided chest pain on occasion that improves with Tums.  He reported taking yums today which did not result in immediate relief of symptoms. Chest x-ray was unremarkable.                                                         Review of Systems:  Constitutional:  No weight loss, night sweats, Fevers, chills, fatigue.  HEENT:  No headaches, Difficulty swallowing,Tooth/dental problems,Sore throat,  No sneezing, itching, ear ache, nasal congestion, post nasal drip,  Cardio-vascular:  Positive for chest pain, denies Orthopnea, PND, swelling in lower extremities, anasarca, dizziness, palpitations  GI:  No heartburn, positive for indigestion, abdominal pain, nausea, vomiting, diarrhea, change in bowel habits, loss of appetite  Resp:  No shortness of breath with exertion or at rest. No excess mucus, no productive cough, No non-productive cough, No coughing up of blood.No change in color of mucus.No wheezing.No chest wall deformity  Skin:  no rash or lesions.  GU:  no dysuria, change in color of urine, no urgency or frequency. No flank pain.  Musculoskeletal:   No joint pain or swelling. No decreased range of motion. No back pain.  Psych:  No change in mood or affect. No depression or anxiety. No memory loss.   Past Medical History  Diagnosis Date  . Diabetes mellitus without complication   . Hyperlipidemia   . Hypertension   . Chronic kidney disease   . Edema   . Depression   . Diabetic retinopathy    Past Surgical History  Procedure Laterality Date  . Leg amputation below knee Bilateral   . Right retinal detachment repair  Social History:  reports that he has quit smoking. He does not have any smokeless tobacco history on file. He reports that he does not drink alcohol or use illicit drugs.  No Known Allergies  History reviewed. No pertinent family history.   Prior to Admission medications   Medication Sig Start Date End Date Taking? Authorizing Provider  acetaminophen (TYLENOL) 325 MG tablet Take 650 mg by mouth every 4 (four) hours as needed for pain.    Historical Provider, MD  amLODipine (NORVASC) 10 MG tablet Take 10 mg by mouth daily.    Historical Provider, MD  aspirin EC 81 MG tablet Take 81 mg by mouth daily.    Historical Provider, MD  atorvastatin (LIPITOR) 80 MG tablet Take 80 mg by mouth daily.    Historical Provider, MD  b complex-vitamin c-folic acid (NEPHRO-VITE) 0.8 MG TABS Take 0.8 mg by mouth at bedtime.  Historical Provider, MD  Calcium Carb-Cholecalciferol (CALCIUM 500 +D) 500-400 MG-UNIT TABS Take 1 tablet by mouth 2 (two) times daily.    Historical Provider, MD  cloNIDine (CATAPRES - DOSED IN MG/24 HR) 0.2 mg/24hr patch Place 1 patch onto the skin once a week.    Historical Provider, MD  diclofenac sodium (VOLTAREN) 1 % GEL Apply 2 g topically 4 (four) times daily as needed (shoulder pain).    Historical Provider, MD  docusate sodium (COLACE) 100 MG capsule Take 100 mg by mouth 2 (two) times daily.    Historical Provider, MD  fish oil-omega-3 fatty acids 1000 MG capsule Take 1 g by mouth daily.     Historical Provider, MD  furosemide (LASIX) 40 MG tablet Take 40 mg by mouth daily.    Historical Provider, MD  hydrALAZINE (APRESOLINE) 50 MG tablet Take 1 tablet (50 mg total) by mouth 4 (four) times daily. 09/22/14   Tiffany L Reed, DO  insulin aspart (NOVOLOG) 100 UNIT/ML injection Inject 30 Units into the skin 3 (three) times daily before meals. 04/21/13   Tiffany L Reed, DO  Insulin Glargine (TOUJEO SOLOSTAR) 300 UNIT/ML SOPN Inject 85 Units into the skin daily. 09/21/14   Tiffany L Reed, DO  linagliptin (TRADJENTA) 5 MG TABS tablet Take 5 mg by mouth daily.    Historical Provider, MD  metoprolol (LOPRESSOR) 100 MG tablet Take 100 mg by mouth 2 (two) times daily.    Historical Provider, MD  Polyethyl Glycol-Propyl Glycol (SYSTANE) 0.4-0.3 % SOLN Apply 1 drop to eye daily. Both eyes    Historical Provider, MD   Physical Exam: Filed Vitals:   01/01/15 1732 01/01/15 1745 01/01/15 1830 01/01/15 1923  BP:  166/85 141/96 168/82  Pulse:  62 59 63  Temp:      TempSrc:      Resp:  Weight: 137.44 kg (303 lb)     SpO2:  98% 97% 99%    Wt Readings from Last 3 Encounters:  01/01/15 137.44 kg (303 lb)  11/30/14 104.327 kg (230 lb)  11/01/14 109.317 kg (241 lb)    General:  Appears calm, no acute distress, currently chest pain free Eyes: PERRL, normal lids, irises & conjunctiva ENT: grossly normal hearing, lips & tongue Neck: no LAD, masses or thyromegaly Cardiovascular: RRR, no m/r/g. No LE edema. Telemetry: SR, no arrhythmias  Respiratory: CTA bilaterally, no w/r/r. Normal respiratory effort. Abdomen: soft, ntnd Skin: no rash or induration seen on limited exam Musculoskeletal: s/p bilateral BKA Psychiatric: grossly normal mood and affect, speech fluent and appropriate Neurologic: grossly non-focal.          Labs on Admission:  Basic Metabolic Panel:  Recent Labs Lab 01/01/15 1700  NA 143  K 3.7  CL 109  CO2 27  GLUCOSE 72  BUN 29*  CREATININE 2.35*  CALCIUM 10.0     Liver Function Tests: No results for input(s): AST, ALT, ALKPHOS, BILITOT, PROT, ALBUMIN in the last 168 hours. No results for input(s): LIPASE, AMYLASE in the last 168 hours. No results for input(s): AMMONIA in the last 168 hours. CBC:  Recent Labs Lab 01/01/15 1700  WBC 6.6  NEUTROABS 3.5  HGB 11.7*  HCT 36.2*  MCV 83.0  PLT 161   Cardiac Enzymes: No results for input(s): CKTOTAL, CKMB, CKMBINDEX, TROPONINI in the last 168 hours.  BNP (last 3 results)  Recent Labs  01/01/15 1729  BNP 85.4    ProBNP (last 3 results) No results for  input(s): PROBNP in the last 8760 hours.  CBG: No results for input(s): GLUCAP in the last 168 hours.  Radiological Exams on Admission: Dg Chest Port 1 View  01/01/2015   CLINICAL DATA:  74 year old male with central chest pain today.  EXAM: PORTABLE CHEST - 1 VIEW  COMPARISON:  No priors.  FINDINGS: Lung volumes are low. No consolidative airspace disease. No pleural effusions. No pneumothorax. No pulmonary nodule or mass noted. Pulmonary vasculature and the cardiomediastinal silhouette are within normal limits.  IMPRESSION: 1. Low lung volumes without radiographic evidence of acute cardiopulmonary disease.   Electronically Signed   By: Trudie Reedaniel  Entrikin M.D.   On: 01/01/2015 17:51    EKG: Independently reviewed. No acute changes  Assessment/Plan Principal Problem:   Chest pain Active Problems:   Essential hypertension, benign   CAD (coronary artery disease)   Diabetic nephropathy   Uncontrolled secondary diabetes mellitus with stage 3 CKD (GFR 30-59)   Hypertensive renal disease   CKD stage 3 due to type 2 diabetes mellitus   1. Chest Pain. Patient presenting with chest pain having atypical features. Initial workup performed in the emergency department showed EKG that was negative for acute changes, troponin negative, chest x-ray unremarkable as well. He was chest pain-free during my evaluation. Possibilities include gastroesophageal  reflux disease however he has multiple cardiovascular risk factors including insulin dependent diabetes mellitus, hypertension, dyslipidemia. We'll continue antiplatelet therapy with aspirin 325 mg by mouth daily, metoprolol 100 mg by mouth twice a day, atorvastatin 80 mg by mouth daily. Cycle troponins overnight, repeat chest x-ray in a.m. Case discussed with Dr [place] LionsHochrien this evening who recommended cycling enzymes as cardiology will evaluate in a.m. Will keep patient nothing by mouth.  2. Insulin dependent diabetes mellitus. Given patient's nothing by mouth status, will hold Lantus, provide sliding scale coverage with Accu-Cheks every 4 hours overnight. 3. Hypertension. Patient having elevated blood pressures this evening likely due to have not taken his antihypertensive agents. Will restart blood pressure meds, provide as needed IV hydralazine for systolic blood pressures greater than 165. 4. Dyslipidemia. Continue Lipitor 80 mg by mouth daily 5. Stage III chronic kidney disease. Initial labs showing creatinine of 2.35, having creatinine of 2.29 on 11/29/2014. We'll continue to follow lab work. 6. Fluid lites nutrition. Patient will be kept nothing by mouth after midnight, provide gentle IV fluid hydration with normal saline running at 75 mL/hour x 10 hours.  7. DVT prophylaxis. Subcutaneous heparin    Code Status: Full Code DVT Prophylaxis: [place] Heparin Family Communication: I spoke his daughter-in-law who was present at bedside Disposition Plan: Will admit patient to tele, do not anticipate requiring greater than 2 nights hospitalization  Time spent: 55 min  Jeralyn BennettZAMORA, Winfred Redel [facility] Pager [phone]

## 2015-01-01 NOTE — ED Notes (Signed)
Pt states his weight has been 240's lb, RN weighed pt on hallway stretcher scale and read 303lbs. Pt's abd more distended and legs swollen.

## 2015-01-01 NOTE — ED Notes (Signed)
Pt's dtr called [facility] and has recent weight of 225lb on December 23 2014

## 2015-01-01 NOTE — ED Provider Notes (Signed)
CSN: [id number]     Arrival date & time 01/01/15  1609 History   First MD Initiated Contact with Patient 01/01/15 1628     Chief Complaint  Patient presents with  . Abdominal Pain  . Chest Pain     (Consider location/radiation/quality/duration/timing/severity/associated sxs/prior Treatment) HPI   This patient is a 74 year old male with past medical history of diabetes status post bilateral lower extremity amputation, chronic kidney disease no history of coronary disease or CHF comes in today with complaint of chest pain that started earlier today.  The chest pain is left-sided and he describes it as a dull sensation is waxing and waning but constant since earlier today. Denies any shortness of breath, nausea, diaphoresis no recent coughs or illnesses.  Past Medical History  Diagnosis Date  . Diabetes mellitus without complication   . Hyperlipidemia   . Hypertension   . Chronic kidney disease   . Edema   . Depression   . Diabetic retinopathy    Past Surgical History  Procedure Laterality Date  . Leg amputation below knee Bilateral   . Right retinal detachment repair  History reviewed. No pertinent family history. History  Substance Use Topics  . Smoking status: Former Games developer  . Smokeless tobacco: Not on file  . Alcohol Use: No    Review of Systems  Constitutional: Negative for fever and chills.  Eyes: Negative for redness.  Respiratory: Negative for cough and shortness of breath.   Cardiovascular: Positive for chest pain.  Gastrointestinal: Negative for nausea, vomiting, abdominal pain and diarrhea.  Genitourinary: Negative for dysuria.  Skin: Negative for rash.  Neurological: Negative for headaches.  All other systems reviewed and are negative.     Allergies  Review of patient's allergies indicates no known allergies.  Home Medications   Prior to Admission medications   Medication Sig Start Date End Date Taking? Authorizing Provider   acetaminophen (TYLENOL) 325 MG tablet Take 650 mg by mouth every 4 (four) hours as needed for pain.    Historical Provider, MD  amLODipine (NORVASC) 10 MG tablet Take 10 mg by mouth daily.    Historical Provider, MD  aspirin EC 81 MG tablet Take 81 mg by mouth daily.    Historical Provider, MD  atorvastatin (LIPITOR) 80 MG tablet Take 80 mg by mouth daily.    Historical Provider, MD  b complex-vitamin c-folic acid (NEPHRO-VITE) 0.8 MG TABS Take 0.8 mg by mouth at bedtime.    Historical Provider, MD  Calcium Carb-Cholecalciferol (CALCIUM 500 +D) 500-400 MG-UNIT TABS Take 1 tablet by mouth 2 (two) times daily.    Historical Provider, MD  cloNIDine (CATAPRES - DOSED IN MG/24 HR) 0.2 mg/24hr patch Place 1 patch onto the skin once a week.    Historical Provider, MD  diclofenac sodium (VOLTAREN) 1 % GEL Apply 2 g topically 4 (four) times daily as needed (shoulder pain).    Historical Provider, MD  docusate sodium (COLACE) 100 MG capsule Take 100 mg by mouth 2 (two) times daily.    Historical Provider, MD  fish oil-omega-3 fatty acids 1000 MG capsule Take 1 g by mouth daily.    Historical Provider, MD  furosemide (LASIX) 40 MG tablet Take 40 mg by mouth daily.    Historical Provider, MD  hydrALAZINE (APRESOLINE) 50 MG tablet Take 1 tablet (50 mg total) by mouth 4 (four) times daily. 09/22/14   Tiffany L Reed, DO  insulin aspart (NOVOLOG) 100 UNIT/ML injection Inject 30 Units  into the skin 3 (three) times daily before meals. 04/21/13   Tiffany L Reed, DO  Insulin Glargine (TOUJEO SOLOSTAR) 300 UNIT/ML SOPN Inject 85 Units into the skin daily. 09/21/14   Tiffany L Reed, DO  linagliptin (TRADJENTA) 5 MG TABS tablet Take 5 mg by mouth daily.    Historical Provider, MD  metoprolol (LOPRESSOR) 100 MG tablet Take 100 mg by mouth 2 (two) times daily.    Historical Provider, MD  Polyethyl Glycol-Propyl Glycol (SYSTANE) 0.4-0.3 % SOLN Apply 1 drop to eye daily. Both eyes    Historical Provider, MD   BP 153/85 mmHg   Pulse 63  Temp(Src) 98 F (36.7 C) (Oral)  Resp 21  Ht   Wt 303 lb (137.44 kg)  SpO2 98% Physical Exam  Constitutional: He is oriented to person, place, and time. No distress.  HENT:  Head: Normocephalic and atraumatic.  Eyes: EOM are normal. Pupils are equal, round, and reactive to light.  Neck: Normal range of motion. Neck supple.  Cardiovascular: Normal rate.   Pulmonary/Chest: Effort normal. No respiratory distress. He has no wheezes. He has no rales.  Abdominal: Soft. There is no tenderness.  Musculoskeletal: Normal range of motion.  Bilateral BKA  Neurological: He is alert and oriented to person, place, and time.  Skin: No rash noted. He is not diaphoretic.    ED Course  Procedures (including critical care time) Labs Review Labs Reviewed  CBC WITH DIFFERENTIAL/PLATELET - Abnormal; Notable for the following:    Hemoglobin 11.7 (*)    HCT 36.2 (*)    RDW 16.1 (*)    All other components within normal limits  BASIC METABOLIC PANEL - Abnormal; Notable for the following:    BUN 29 (*)    Creatinine, Ser 2.35 (*)    GFR calc non Af Amer 26 (*)    GFR calc Af Amer 30 (*)    All other components within normal limits  BRAIN NATRIURETIC PEPTIDE  I-STAT TROPOININ, ED    Imaging Review Dg Chest Port 1 View  01/01/2015   CLINICAL DATA:  74 year old male with central chest pain today.  EXAM: PORTABLE CHEST - 1 VIEW  COMPARISON:  No priors.  FINDINGS: Lung volumes are low. No consolidative airspace disease. No pleural effusions. No pneumothorax. No pulmonary nodule or mass noted. Pulmonary vasculature and the cardiomediastinal silhouette are within normal limits.  IMPRESSION: 1. Low lung volumes without radiographic evidence of acute cardiopulmonary disease.   Electronically Signed   By: Trudie Reed M.D.   On: 01/01/2015 17:51     EKG Interpretation   Date/Time:  Saturday January 01 2015 17:10:35 EST Ventricular Rate:  62 PR Interval:  240 QRS Duration: 99 QT Interval:   450 QTC Calculation: 457 R Axis:   -15 Text Interpretation:  Sinus rhythm Prolonged PR interval Borderline left  axis deviation Confirmed by Bebe Shaggy  MD, Dorinda Hill ([id number]) on 01/01/2015  5:30:32 PM      MDM   Final diagnoses:  Chest pain  Abdominal pain    This patient is a 74 year old male with past medical history of diabetes status post bilateral lower extremity amputation and chronic kidney disease.  No history of coronary disease and CHF comes in today with complaint of chest pain that started earlier today.  Patient hasn't had no provocative testing in the last 10 years per patient.  His EKG is unremarkable without evidence of ischemia we'll obtain CBC BMP troponin   First troponin 0.01, CBC/BMP unremarkable. Will get  BNP as he endorses some weight gain his lungs are clear to auscultation bilaterally. Plan to admit for chest pain rule out Patient currently chest pain free.  Spoke w/ hospitalist, will admit for chest pain rule out w/ possible stress testing.  Silas FloodErik Nicle Connole, MD 01/02/15 [id number]  Silas FloodErik Sheridan Hew, MD 01/03/15 0100  Zadie Rhineonald Wickline, MD 01/03/15 [phone]

## 2015-01-01 NOTE — ED Notes (Signed)
Pt from [facility]. Complaining of chest pain for a day. Pt having distention in abd, dull pain to abd. CBG 119. BP 165/83, HR 64. No meds given. Pt alert and oriented

## 2015-01-01 NOTE — ED Provider Notes (Signed)
Patient seen/examined in the Emergency Department in conjunction with Resident Physician Provider Proulx Patient reports CP.  He is now CP free Exam : awake/alert, no distress noted Plan: will admit for chest pain evaluation. Pt with multiple cardiac risk factors.    Pt stable in ED   EKG Interpretation  Date/Time:  Saturday January 01 2015 17:10:35 EST Ventricular Rate:  62 PR Interval:  240 QRS Duration: 99 QT Interval:  450 QTC Calculation: 457 R Axis:   -15 Text Interpretation:  Sinus rhythm Prolonged PR interval Borderline left axis deviation Confirmed by Bebe ShaggyWICKLINE  MD, Kenyanna Grzesiak ([id number]) on 01/01/2015 5:30:32 PM         Marc Rhineonald Taniesha Glanz, MD 01/01/15 1921

## 2015-01-01 NOTE — ED Notes (Signed)
Attempted report x1. 

## 2015-01-02 DIAGNOSIS — E1122 Type 2 diabetes mellitus with diabetic chronic kidney disease: Secondary | ICD-10-CM | POA: Diagnosis not present

## 2015-01-02 DIAGNOSIS — N181 Chronic kidney disease, stage 1: Secondary | ICD-10-CM

## 2015-01-02 DIAGNOSIS — I209 Angina pectoris, unspecified: Secondary | ICD-10-CM

## 2015-01-02 DIAGNOSIS — I120 Hypertensive chronic kidney disease with stage 5 chronic kidney disease or end stage renal disease: Secondary | ICD-10-CM

## 2015-01-02 DIAGNOSIS — I10 Essential (primary) hypertension: Secondary | ICD-10-CM | POA: Diagnosis not present

## 2015-01-02 DIAGNOSIS — N182 Chronic kidney disease, stage 2 (mild): Secondary | ICD-10-CM | POA: Diagnosis not present

## 2015-01-02 DIAGNOSIS — N183 Chronic kidney disease, stage 3 (moderate): Secondary | ICD-10-CM | POA: Diagnosis not present

## 2015-01-02 DIAGNOSIS — R079 Chest pain, unspecified: Secondary | ICD-10-CM | POA: Diagnosis not present

## 2015-01-02 DIAGNOSIS — N19 Unspecified kidney failure: Secondary | ICD-10-CM

## 2015-01-02 DIAGNOSIS — I129 Hypertensive chronic kidney disease with stage 1 through stage 4 chronic kidney disease, or unspecified chronic kidney disease: Secondary | ICD-10-CM | POA: Diagnosis not present

## 2015-01-02 DIAGNOSIS — N185 Chronic kidney disease, stage 5: Secondary | ICD-10-CM

## 2015-01-02 DIAGNOSIS — N184 Chronic kidney disease, stage 4 (severe): Secondary | ICD-10-CM

## 2015-01-02 DIAGNOSIS — I25119 Atherosclerotic heart disease of native coronary artery with unspecified angina pectoris: Secondary | ICD-10-CM | POA: Diagnosis not present

## 2015-01-02 DIAGNOSIS — N189 Chronic kidney disease, unspecified: Secondary | ICD-10-CM

## 2015-01-02 LAB — CBC
HCT: 30.6 % — ABNORMAL LOW (ref 39.0–52.0)
Hemoglobin: 9.8 g/dL — ABNORMAL LOW (ref 13.0–17.0)
MCH: 26.3 pg (ref 26.0–34.0)
MCHC: 32 g/dL (ref 30.0–36.0)
MCV: 82 fL (ref 78.0–100.0)
Platelets: 182 10*3/uL (ref 150–400)
RBC: 3.73 MIL/uL — ABNORMAL LOW (ref 4.22–5.81)
RDW: 16.2 % — ABNORMAL HIGH (ref 11.5–15.5)
WBC: 5.1 10*3/uL (ref 4.0–10.5)

## 2015-01-02 LAB — GLUCOSE, CAPILLARY
GLUCOSE-CAPILLARY: 75 mg/dL (ref 70–99)
GLUCOSE-CAPILLARY: 115 mg/dL — AB (ref 70–99)
Glucose-Capillary: 96 mg/dL (ref 70–99)
Glucose-Capillary: 72 mg/dL (ref 70–99)
Glucose-Capillary: 70 mg/dL (ref 70–99)
Glucose-Capillary: 138 mg/dL — ABNORMAL HIGH (ref 70–99)

## 2015-01-02 LAB — BASIC METABOLIC PANEL
Anion gap: 8 (ref 5–15)
BUN: 27 mg/dL — ABNORMAL HIGH (ref 6–23)
CALCIUM: 9.6 mg/dL (ref 8.4–10.5)
CHLORIDE: 110 mmol/L (ref 96–112)
CO2: 25 mmol/L (ref 19–32)
Creatinine, Ser: 2.29 mg/dL — ABNORMAL HIGH (ref 0.50–1.35)
GFR calc Af Amer: 31 mL/min — ABNORMAL LOW (ref >90)
GFR, EST NON AFRICAN AMERICAN: 26 mL/min — AB (ref >90)
Glucose, Bld: 75 mg/dL (ref 70–99)
Potassium: 3.5 mmol/L (ref 3.5–5.1)
Sodium: 143 mmol/L (ref 135–145)

## 2015-01-02 LAB — MRSA PCR SCREENING: MRSA by PCR: NEGATIVE

## 2015-01-02 LAB — TROPONIN I: Troponin I: 0.03 ng/mL (ref <0.031)

## 2015-01-02 MED ORDER — ALUM HYDROXIDE-MAG TRISILICATE 80-14.2 MG PO CHEW: 1.0000 | CHEWABLE_TABLET | ORAL | Status: DC | PRN

## 2015-01-02 NOTE — Progress Notes (Signed)
Pt a/o, no c/o pain, vss, pt will be d/c'd today, pt stable

## 2015-01-02 NOTE — Discharge Summary (Signed)
Physician Discharge Summary  Marc LieuJimmy Wheeler ZOX:[id number] DOB: 06/09/1941 DOA: 01/01/2015  PCP: Kirt Boysarter, Monica, DO  Admit date: 01/01/2015 Discharge date: 01/02/2015  Time spent: 45 minutes  Recommendations for Outpatient Follow-up:  1. outpt stress test to be ordered by cardiology  Discharge Condition: stable Diet recommendation: heart healthy, diabetic diet, low sodium  Discharge Diagnoses:  Principal Problem:   Angina pectoris Active Problems:   Essential hypertension, benign   CAD (coronary artery disease)   Diabetic nephropathy   Uncontrolled secondary diabetes mellitus with stage 3 CKD (GFR 30-59)   Hypertensive renal disease   CKD stage 3 due to type 2 diabetes mellitus   History of present illness:  Marc Wheeler is a 74 y.o. male with a past medical history of insulin-dependent diabetes mellitus, hypertension, chronic kidney disease 3, status post bilateral below-the-knee amputation, presenting to the emergency department with complaints of right sided chest pain. He reports being in his usual state health, developed right-sided chest pain at approximately 10:30 this morning, characterized as constant, achy, nonradiating, not associate with nausea or physical exertion. He received TUMS at the facility that he resides in. Symptoms improved by the time he reached the emergency room. Initial workup included a troponin and EKG which were negative. Patient reports having right-sided chest pain on occasion that improves with Tums.   Hospital Course:  Chest pain -one episode which resolved and has not recurred - states it occurs maybe once in 3 months and is always on his right side- has taken gaviscon in the past which has helped - Dr Eden EmmsNishan from cardiology has evaluated him- due to cardiac risik factors, cardiology will do a myoview stress test as outpt -will order PRN Gaviscon for outpt use  All other medical problems stable- no other changes in  meds.  Procedures:  none  Consultations:  cardiology  Discharge Exam: Filed Weights   01/01/15 1732 01/01/15 2009  Weight: 137.44 kg (303 lb) 101.787 kg (224 lb 6.4 oz)   Filed Vitals:   01/02/15 1043  BP: 140/62  Pulse:   Temp:   Resp:     General: AAO x 3, no distress Cardiovascular: RRR, no murmurs  Respiratory: clear to auscultation bilaterally GI: soft, non-tender, non-distended, bowel sound positive  Discharge Instructions You were cared for by a hospitalist during your hospital stay. If you have any questions about your discharge medications or the care you received while you were in the hospital after you are discharged, you can call the unit and asked to speak with the hospitalist on call if the hospitalist that took care of you is not available. Once you are discharged, your primary care physician will handle any further medical issues. Please note that NO REFILLS for any discharge medications will be authorized once you are discharged, as it is imperative that you return to your primary care physician (or establish a relationship with a primary care physician if you do not have one) for your aftercare needs so that they can reassess your need for medications and monitor your lab values.     Medication List    TAKE these medications        acetaminophen 325 MG tablet  Commonly known as:  TYLENOL  Take 650 mg by mouth every 4 (four) hours as needed for pain.     Alum Hydroxide-Mag Trisilicate 80-14.2 MG Chew  Commonly known as:  GAVISCON  Chew 1-2 tablets by mouth every 4 (four) hours as needed (indigestion- pain).     amLODipine  10 MG tablet  Commonly known as:  NORVASC  Take 10 mg by mouth daily.     aspirin EC 81 MG tablet  Take 81 mg by mouth daily.     atorvastatin 80 MG tablet  Commonly known as:  LIPITOR  Take 80 mg by mouth daily.     b complex-vitamin c-folic acid 0.8 MG Tabs tablet  Take 0.8 mg by mouth at bedtime.     Calcium  Carbonate-Vitamin D 600-200 MG-UNIT Tabs  Take 1 tablet by mouth daily.     cloNIDine 0.2 mg/24hr patch  Commonly known as:  CATAPRES - Dosed in mg/24 hr  Place 1 patch onto the skin once a week.     diclofenac sodium 1 % Gel  Commonly known as:  VOLTAREN  Apply 2 g topically 4 (four) times daily as needed (shoulder pain).     docusate sodium 100 MG capsule  Commonly known as:  COLACE  Take 100 mg by mouth 2 (two) times daily.     fish oil-omega-3 fatty acids 1000 MG capsule  Take 1 g by mouth daily.     furosemide 40 MG tablet  Commonly known as:  LASIX  Take 40 mg by mouth daily.     hydrALAZINE 50 MG tablet  Commonly known as:  APRESOLINE  Take 1 tablet (50 mg total) by mouth 4 (four) times daily.     insulin aspart 100 UNIT/ML injection  Commonly known as:  novoLOG  Inject 30 Units into the skin 3 (three) times daily before meals.     Insulin Glargine 300 UNIT/ML Sopn  Commonly known as:  TOUJEO SOLOSTAR  Inject 85 Units into the skin daily.     linagliptin 5 MG Tabs tablet  Commonly known as:  TRADJENTA  Take 5 mg by mouth daily.     metoprolol 100 MG tablet  Commonly known as:  LOPRESSOR  Take 100 mg by mouth 2 (two) times daily.     SYSTANE 0.4-0.3 % Soln  Generic drug:  Polyethyl Glycol-Propyl Glycol  Apply 1 drop to eye daily as needed. Both eyes       No Known Allergies    The results of significant diagnostics from this hospitalization (including imaging, microbiology, ancillary and laboratory) are listed below for reference.    Significant Diagnostic Studies: Dg Chest Port 1 View  01/01/2015   CLINICAL DATA:  74 year old male with central chest pain today.  EXAM: PORTABLE CHEST - 1 VIEW  COMPARISON:  No priors.  FINDINGS: Lung volumes are low. No consolidative airspace disease. No pleural effusions. No pneumothorax. No pulmonary nodule or mass noted. Pulmonary vasculature and the cardiomediastinal silhouette are within normal limits.  IMPRESSION: 1.  Low lung volumes without radiographic evidence of acute cardiopulmonary disease.   Electronically Signed   By: Trudie Reed M.D.   On: 01/01/2015 17:51    Microbiology: Recent Results (from the past 240 hour(s))  MRSA PCR Screening     Status: None   Collection Time: 01/01/15  8:40 PM  Result Value Ref Range Status   MRSA by PCR NEGATIVE NEGATIVE Final    Comment:        The GeneXpert MRSA Assay (FDA approved for NASAL specimens only), is one component of a comprehensive MRSA colonization surveillance program. It is not intended to diagnose MRSA infection nor to guide or monitor treatment for MRSA infections.      Labs: Basic Metabolic Panel:  Recent Labs Lab 01/01/15 1700 01/01/15 2050  01/02/15 0540  NA 143  --  143  K 3.7  --  3.5  CL 109  --  110  CO2 27  --  25  GLUCOSE 72  --  75  BUN 29*  --  27*  CREATININE 2.35* 2.37* 2.29*  CALCIUM 10.0  --  9.6   Liver Function Tests: No results for input(s): AST, ALT, ALKPHOS, BILITOT, PROT, ALBUMIN in the last 168 hours. No results for input(s): LIPASE, AMYLASE in the last 168 hours. No results for input(s): AMMONIA in the last 168 hours. CBC:  Recent Labs Lab 01/01/15 1700 01/01/15 2050 01/02/15 0540  WBC 6.6 6.3 5.1  NEUTROABS 3.5  --   --   HGB 11.7* 11.5* 9.8*  HCT 36.2* 35.3* 30.6*  MCV 83.0 83.1 82.0  PLT 161 179 182   Cardiac Enzymes:  Recent Labs Lab 01/01/15 2050 01/01/15 2359 01/02/15 0540  TROPONINI <0.03 0.03 <0.03   BNP: BNP (last 3 results)  Recent Labs  01/01/15 1729  BNP 85.4    ProBNP (last 3 results) No results for input(s): PROBNP in the last 8760 hours.  CBG:  Recent Labs Lab 01/01/15 2314 01/02/15 0106 01/02/15 0418 01/02/15 0500 01/02/15 0539  GLUCAP 110* 96 75 72 115*       SignedCalvert Cantor, MD [facility] 01/02/2015, 11:18 AM

## 2015-01-02 NOTE — Progress Notes (Signed)
Utilization review completed.  P.J. Koda Routon,RN,BSN Case Manager 

## 2015-01-02 NOTE — Progress Notes (Signed)
CBG 72, since pt NPO, and has been low tonight, gave the other half of D50, went back 115, will continue to monitor, thanks, Lavonda JumboMike F RN

## 2015-01-02 NOTE — Clinical Social Work Psychosocial (Signed)
Clinical Social Work Department BRIEF PSYCHOSOCIAL ASSESSMENT 01/02/2015  Patient:  Marc Wheeler,Marc Wheeler     Account Number:  [id number]     Admit date:  01/01/2015  Clinical Social Worker:  Derenda FennelNIXON,Sumit Branham, CLINICAL SOCIAL WORKER  Date/Time:  01/02/2015 02:18 PM  Referred by:  Physician  Date Referred:  01/02/2015 Referred for  SNF Placement   Other Referral:   Interview type:  Other - See comment Other interview type:   CSW spoke with pt's son, [name] via telephone.    PSYCHOSOCIAL DATA Living Status:  FACILITY Admitted from facility:  [facility] [place] Level of care:  Skilled Nursing Facility Primary support name:  Irene LimboJimmy Birge Jr. Primary support relationship to patient:  CHILD, ADULT Degree of support available:   Good    CURRENT CONCERNS Current Concerns  Post-Acute Placement   Other Concerns:    SOCIAL WORK ASSESSMENT / PLAN Clinical Social Worker spoke with pt's son in reference to pt's return [facility]. Pt's son reported [facility] the [facility] and is agreeable to pt's return. Pt's son asked several questions in regards to pt's stress test results and CSW encouraged pt's son to contact Unit RN/ MD for results. Pt's son also stated he is fine with pt being transported back to [facility] via [facility]. CSW will continue to follow pt and pt's family for support and to facilitate discharge needs.   Assessment/plan status:  Psychosocial Support/Ongoing Assessment of Needs Other assessment/ plan:   Pt's clinicals faxed to [facility].   Information/referral to community resources:   SNF    PATIENT'S/FAMILY'S RESPONSE TO PLAN OF CARE: Pt lying in bed. Pt and pt's son agreeable to return to [facility]. Pt's son pleasant and appreciated social work intervention.     Derenda FennelBashira Mauri Tolen, MSW, LCSWA [phone] 01/02/2015 2:24 PM

## 2015-01-02 NOTE — Consult Note (Signed)
CARDIOLOGY CONSULT NOTE       Patient ID: Marc Wheeler MRN: [id number] DOB/AGE: March 29, 1941 74 y.o.  Admit date: 01/01/2015 Referring Physician:  Butler Denmark Primary Physician: Kirt Boys, DO Primary Cardiologist:  Spoke with Dr Antoine Poche last night  Reason for Consultation:  Chest Pain  Principal Problem:   Angina pectoris Active Problems:   Essential hypertension, benign   CAD (coronary artery disease)   Diabetic nephropathy   Uncontrolled secondary diabetes mellitus with stage 3 CKD (GFR 30-59)   Hypertensive renal disease   CKD stage 3 due to type 2 diabetes mellitus   HPI:   74 yo admitted yesterday with atypical chest pain.  Apparently called Dr Antoine Poche who indicated we would see him today but just notified by nurse.  CRF;s DM, elevated lipids and HTN.  Has CRF with Cr 2.3 and not ideal cath candidate.  Has had right sided rest pain for years.  Pain usually relieved with pepsi and stomach medicine.  Yesterday had left sided pain similar rest character Eventually better with GI cocktail.  Primary in wheelchair s/p bilateral below knee amputations at assisted living.  Pain free this am  ECG no acute changes and troponin negative x 2 .  Compliant with meds   ROS All other systems reviewed and negative except as noted above  Past Medical History  Diagnosis Date  . Diabetes mellitus without complication   . Hyperlipidemia   . Hypertension   . Chronic kidney disease   . Edema   . Depression   . Diabetic retinopathy     History reviewed. No pertinent family history.  History   Social History  . Marital Status: Widowed    Spouse Name: N/A  . Number of Children: N/A  . Years of Education: N/A   Occupational History  . Not on file.   Social History Main Topics  . Smoking status: Former Games developer  . Smokeless tobacco: Not on file  . Alcohol Use: No  . Drug Use: No  . Sexual Activity: No   Other Topics Concern  . Not on file   Social History Narrative    Past  Surgical History  Procedure Laterality Date  . Leg amputation below knee Bilateral   . Right retinal detachment repair  . amLODipine  10 mg Oral Daily  . aspirin EC  325 mg Oral Daily  . atorvastatin  80 mg Oral q1800  . [START ON 01/05/2015] cloNIDine  0.2 mg Transdermal Weekly  . docusate sodium  100 mg Oral BID  . heparin  5,000 Units Subcutaneous 3 times per day  . hydrALAZINE  50 mg Oral QID  . insulin aspart  0-15 Units Subcutaneous 6 times per day  . metoprolol  100 mg Oral BID  . polyvinyl alcohol  1 drop Both Eyes Daily   . dextrose 5 % and 0.45% NaCl 75 mL/hr at 01/01/15 2331    Physical Exam: Blood pressure 140/62, pulse 56, temperature 97.9 F (36.6 C), temperature source Oral, resp. rate 20, height 0' (0 m), weight 101.787 kg (224 lb 6.4 oz), SpO2 99 %.   Affect appropriate Obese elderly black male  HEENT: normal Neck supple with no adenopathy JVP normal no bruits no thyromegaly Lungs clear with no wheezing and good diaphragmatic motion Heart:  S1/S2 no murmur, no rub, gallop or click PMI normal Abdomen: benighn, BS positve, no tenderness, no AAA no bruit.  No HSM or HJR Bilateral LE below knee amputations  No edema Neuro non-focal Skin warm and dry No muscular weakness   Labs:   Lab Results  Component Value Date   WBC 5.1 01/02/2015   HGB 9.8* 01/02/2015   HCT 30.6* 01/02/2015   MCV 82.0 01/02/2015   PLT 182 01/02/2015    Recent Labs Lab 01/02/15 0540  NA 143  K 3.5  CL 110  CO2 25  BUN 27*  CREATININE 2.29*  CALCIUM 9.6  GLUCOSE 75   Lab Results  Component Value Date   TROPONINI <0.03 01/02/2015    Lab Results  Component Value Date   CHOL 98 11/03/2014   Lab Results  Component Value Date   HDL 22* 11/03/2014   Lab Results  Component Value Date   LDLCALC 45 11/03/2014   Lab Results  Component Value Date   TRIG 153* 11/03/2014   Lab Results  Component Value Date   CHOLHDL 4.5 11/03/2014   No results found  for: LDLDIRECT    Radiology: Dg Chest Port 1 View  01/01/2015   CLINICAL DATA:  74 year old male with central chest pain today.  EXAM: PORTABLE CHEST - 1 VIEW  COMPARISON:  No priors.  FINDINGS: Lung volumes are low. No consolidative airspace disease. No pleural effusions. No pneumothorax. No pulmonary nodule or mass noted. Pulmonary vasculature and the cardiomediastinal silhouette are within normal limits.  IMPRESSION: 1. Low lung volumes without radiographic evidence of acute cardiopulmonary disease.   Electronically Signed   By: Trudie Reedaniel  Entrikin M.D.   On: 01/01/2015 17:51    EKG:  SR LAD PR 240  No acute ST changes   ASSESSMENT AND PLAN:  Chest pain:  Atypical with GI overtones.  Multiple CRF;s  [facility]  Would d/c on Protonix Anemia:  Needs f/u for this in light of what sounds like chronic GERD and reflux pain   HTN:  Stable continue current meds Chol:  On statin  DM:  Discussed low carb diet.  Target hemoglobin A1c is 6.5 or less.  Continue current medications. Ok to d/c back to assisted living    Signed: Charlton Hawseter Yaritzel Stange 01/02/2015, 11:28 AM

## 2015-01-02 NOTE — Progress Notes (Signed)
Pt BP earlier was 185/75, which meds were given, checked again at this time went to 102/42, pt is on a lot of heart meds, no S/S, MD notified will continue to monitor, thanks Lavonda JumboMike F RN

## 2015-01-02 NOTE — Progress Notes (Signed)
CBG was 60, gave 1/2 dose of D50 went back to 110, MD notified ordered D5 1/2 NS for his fluids, will continue to monitor, thanks Lavonda JumboMike F RN

## 2015-01-02 NOTE — Clinical Social Work Note (Signed)
Clinical Social Worker facilitated patient discharge including contacting patient family and facility to confirm patient discharge plans.  Clinical information faxed to facility and family agreeable with plan.  CSW arranged ambulance transport via [facility].  RN to call report prior to discharge.   DC packet placed on chart for transport. Patient does not require FL-2.   Clinical Social Worker will sign off for now as social work intervention is no longer needed. Please consult us again if new need arises.  Derenda FennelBashira Haila Dena, MSW, LCSWA [phone] 01/02/2015 3:51 PM

## 2015-01-02 NOTE — Progress Notes (Signed)
Patient alert and oriented, denies pain, v/s stable T. 98.3, BP. 140/69, HR. 66, R. 18, 97%/RA., iv d/c, tel d/c, [facility]. Patient awaiting ambulance transportation back to the SNF.

## 2015-01-10 NOTE — Progress Notes (Signed)
Patient ID: Marc Wheeler, male   DOB: 1941/08/10, 74 y.o.   MRN: [id number]  Marc Wheeler living [place]     No Known Allergies     Chief Complaint  Patient presents with  . Medical Management of Chronic Issues    HPI:  He is a long term resident of this facility being seen for the management of his chronic illnesses. Overall his status is stable. He is not voicing any complaints or concerns today; stating that he is feeling good. There are no nursing concerns at this time.    Past Medical History  Diagnosis Date  . Diabetes mellitus without complication   . Hyperlipidemia   . Hypertension   . Chronic kidney disease   . Edema   . Depression   . Diabetic retinopathy     Past Surgical History  Procedure Laterality Date  . Leg amputation below knee Bilateral   . Right retinal detachment repair  VITAL SIGNS BP 148/78 mmHg  Pulse 70  Ht  (1.27 m)  Wt 225 lb (102.059 kg)  BMI 63.28 kg/m2   Outpatient Encounter Prescriptions as of 12/27/2014  Medication Sig  . acetaminophen (TYLENOL) 325 MG tablet Take 650 mg by mouth every 4 (four) hours as needed for pain.  Marland Kitchen amLODipine (NORVASC) 10 MG tablet Take 10 mg by mouth daily.  Marland Kitchen aspirin EC 81 MG tablet Take 81 mg by mouth daily.  Marland Kitchen atorvastatin (LIPITOR) 80 MG tablet Take 80 mg by mouth daily.  Marland Kitchen b complex-vitamin c-folic acid (NEPHRO-VITE) 0.8 MG TABS Take 0.8 mg by mouth at bedtime.  . cloNIDine (CATAPRES - DOSED IN MG/24 HR) 0.2 mg/24hr patch Place 1 patch onto the skin once a week.  . diclofenac sodium (VOLTAREN) 1 % GEL Apply 2 g topically 4 (four) times daily as needed (shoulder pain).  Marland Kitchen docusate sodium (COLACE) 100 MG capsule Take 100 mg by mouth 2 (two) times daily.  . fish oil-omega-3 fatty acids 1000 MG capsule Take 1 g by mouth daily.  . furosemide (LASIX) 40 MG tablet Take 40 mg by mouth daily.  . hydrALAZINE (APRESOLINE) 50 MG tablet Take 1 tablet (50 mg total) by mouth 4 (four) times daily.  .  insulin aspart (NOVOLOG) 100 UNIT/ML injection Inject 30 Units into the skin 3 (three) times daily before meals.  . Insulin Glargine (TOUJEO SOLOSTAR) 300 UNIT/ML SOPN Inject 85 Units into the skin daily.  Marland Kitchen linagliptin (TRADJENTA) 5 MG TABS tablet Take 5 mg by mouth daily.  . metoprolol (LOPRESSOR) 100 MG tablet Take 100 mg by mouth 2 (two) times daily.  Bertram Gala Glycol-Propyl Glycol (SYSTANE) 0.4-0.3 % SOLN Apply 1 drop to eye daily as needed. Both eyes     SIGNIFICANT DIAGNOSTIC EXAMS   LABS REVIEWED:   08-30-14: urine culture: hafnia alvei: cipro 09-01-14: wbc 6.7 ;hgb 10.8; hct 34; mcv 84.1; plt 183; glucose 128; bun 27; creat 1.97; k+4.7; na++143; phos 3.5; iron 60; tibc 237; ferritin 367 10-06-14: glucose 166; bun 41; creat 2.5; k+4.7; na++143 11-03-14; hgb a1c 8.2; chol 98; ldl 45; trig 153; hdl 22 11-29-14: wbc 6.3; hgb 10.7; hct 34.8; mcv 84.3; plt 199; glucose 146; bun 31; creat 2.29; k+4.0; na++140; liver normal albumin 3.7; phos 3.3      ROS Constitutional: Negative for malaise/fatigue.  Respiratory: Negative for cough and shortness of breath.   Cardiovascular: Negative for chest pain and palpitations.  Gastrointestinal: Negative for heartburn, abdominal pain and constipation.  Musculoskeletal: Negative for myalgias and back pain.  Skin: Negative.   Neurological: Negative for headaches.  Psychiatric/Behavioral: Negative for depression. The patient is not nervous/anxious.   Physical Exam Constitutional: He is oriented to person, place, and time. He appears well-developed and well-nourished. No distress.  Obese   Neck: Neck supple. No JVD present. No thyromegaly present.  Cardiovascular: Normal rate and regular rhythm.   Respiratory: Effort normal and breath sounds normal. No respiratory distress.  GI: Soft. Bowel sounds are normal. He exhibits no distension. There is no tenderness.  Musculoskeletal:  Bilateral bka;  Is able to move upper extremities     Neurological: He is alert and oriented to person, place, and time.  Skin: Skin is warm and dry. He is not diaphoretic.       ASSESSMENT/ PLAN:   1. Diabetes: will continue tuojeo 85 units daily; novolog 30 units with meals; tradjenta 5 mg daily; asa 81 mg daily; will monitor his hgb a1c is 8.2   2. Hypertensive renal disease: will continue norvasc 10 mg daily; lopressor 100 mg twice daily; clonidine 0.2 mg patch changed weekly;  hydralazine 50 mg four times daily; asa 81 mg daily   3. Dyslipidemia: will continue lipitor 80 mg daily; fish oil 1 gm daily his ldl is 45   4. 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 Diabetic retinopathy: is on systane eye drop to both eyes is followed by eye doctor   6. Osteoarthritis: is stable; will continue voltaren gel to left should four times daily as needed   7. Constipation: will continue colace twice daily       Synthia Innocenteborah Green NP [facility]  Contact [phone] Monday through Friday 8am- 5pm  After hours call [phone]

## 2015-01-19 ENCOUNTER — Encounter: Payer: Self-pay | Admitting: Internal Medicine

## 2015-01-19 ENCOUNTER — Non-Acute Institutional Stay (SKILLED_NURSING_FACILITY): Payer: Medicare Other | Admitting: Internal Medicine

## 2015-01-19 DIAGNOSIS — I209 Angina pectoris, unspecified: Secondary | ICD-10-CM

## 2015-01-19 DIAGNOSIS — E1322 Other specified diabetes mellitus with diabetic chronic kidney disease: Secondary | ICD-10-CM

## 2015-01-19 DIAGNOSIS — E1329 Other specified diabetes mellitus with other diabetic kidney complication: Secondary | ICD-10-CM | POA: Diagnosis not present

## 2015-01-19 DIAGNOSIS — IMO0002 Reserved for concepts with insufficient information to code with codable children: Secondary | ICD-10-CM

## 2015-01-19 DIAGNOSIS — N183 Chronic kidney disease, stage 3 (moderate): Secondary | ICD-10-CM | POA: Diagnosis not present

## 2015-01-19 DIAGNOSIS — I10 Essential (primary) hypertension: Secondary | ICD-10-CM | POA: Diagnosis not present

## 2015-01-19 DIAGNOSIS — Z89511 Acquired absence of right leg below knee: Secondary | ICD-10-CM | POA: Diagnosis not present

## 2015-01-19 DIAGNOSIS — E1365 Other specified diabetes mellitus with hyperglycemia: Secondary | ICD-10-CM

## 2015-01-19 DIAGNOSIS — Z89512 Acquired absence of left leg below knee: Secondary | ICD-10-CM

## 2015-01-19 NOTE — Progress Notes (Signed)
Patient ID: Marc Wheeler, male   DOB: Jan 02, 1941, 74 y.o.   MRN: [id number]    HISTORY AND PHYSICAL  Location:    [facility]   Place of Service:   SNF  Extended Emergency Contact Information Primary Emergency Contact: Delta  [place] Phone: [phone] Relation: Son Secondary Emergency Contact: Loel [place] Phone: [phone] Relation: Other  Advanced Directive information  FULL CODE  Chief Complaint  Patient presents with  . Readmit To SNF    HPI:  74 yo male seen today as a readmission into SNF following hospital stay for angina pectoris with hx CAD. He has a hx HTN, DM II with stage 3 CKD, b/l BKA. He is w/c bound. No c/o today. Denies further CP and o SOB. He is waiting cardiology appt. He will need an o/p stress test.  DM uncontrolled with A1c 8.2%. CBG today 71. No low BS reactions. He takes linagliptin, toujeo and SSI with meals. He has diabetic retinopathy  BP controlled on amlodipine, clonidine patch, furosemide, hydralazine and metoprolol.  Edema stable on furosemide  He takes lipitor for cholesterol mx. No myalgias   Past Medical History  Diagnosis Date  . Diabetes mellitus without complication   . Hyperlipidemia   . Hypertension   . Chronic kidney disease   . Edema   . Depression   . Diabetic retinopathy     Past Surgical History  Procedure Laterality Date  . Leg amputation below knee Bilateral   . Right retinal detachment repair  1 22 14     Patient Care Team: Gildardo Cranker, DO as PCP - General (Internal Medicine) Gerlene Fee, NP as Nurse Practitioner (Nurse Practitioner)  History   Social History  . Marital Status: Widowed    Spouse Name: N/A  . Number of Children: N/A  . Years of Education: N/A   Occupational History  . Not on file.   Social History Main Topics  . Smoking status: Former Research scientist (life sciences)  . Smokeless tobacco: Not on file  . Alcohol  Use: No  . Drug Use: No  . Sexual Activity: No   Other Topics Concern  . Not on file   Social History Narrative     reports that he has quit smoking. He does not have any smokeless tobacco history on file. He reports that he does not drink alcohol or use illicit drugs.  No family history on file. No family status information on file.    Immunization History  Administered Date(s) Administered  . Influenza-Unspecified 08/04/2014  . PPD Test 05/18/2011, 05/25/2011    No Known Allergies  Medications: Patient's Medications  New Prescriptions   No medications on file  Previous Medications   ACETAMINOPHEN (TYLENOL) 325 MG TABLET    Take 650 mg by mouth every 4 (four) hours as needed for pain.   ALUM HYDROXIDE-MAG TRISILICATE (GAVISCON) 94-70.9 MG CHEW    Chew 1-2 tablets by mouth every 4 (four) hours as needed (indigestion- pain).   AMLODIPINE (NORVASC) 10 MG TABLET    Take 10 mg by mouth daily.   ASPIRIN EC 81 MG TABLET    Take 81 mg by mouth daily.   ATORVASTATIN (LIPITOR) 80 MG TABLET    Take 80 mg by mouth daily.   B COMPLEX-VITAMIN C-FOLIC ACID (NEPHRO-VITE) 0.8 MG TABS    Take 0.8 mg by mouth at bedtime.   CALCIUM CARBONATE-VITAMIN D 600-200 MG-UNIT TABS    Take 1 tablet by  mouth daily.   CLONIDINE (CATAPRES - DOSED IN MG/24 HR) 0.2 MG/24HR PATCH    Place 1 patch onto the skin once a week.   DICLOFENAC SODIUM (VOLTAREN) 1 % GEL    Apply 2 g topically 4 (four) times daily as needed (shoulder pain).   DOCUSATE SODIUM (COLACE) 100 MG CAPSULE    Take 100 mg by mouth 2 (two) times daily.   FISH OIL-OMEGA-3 FATTY ACIDS 1000 MG CAPSULE    Take 1 g by mouth daily.   FUROSEMIDE (LASIX) 40 MG TABLET    Take 40 mg by mouth daily.   HYDRALAZINE (APRESOLINE) 50 MG TABLET    Take 1 tablet (50 mg total) by mouth 4 (four) times daily.   INSULIN ASPART (NOVOLOG) 100 UNIT/ML INJECTION    Inject 30 Units into the skin 3 (three) times daily before meals.   INSULIN GLARGINE (TOUJEO SOLOSTAR) 300  UNIT/ML SOPN    Inject 85 Units into the skin daily.   LINAGLIPTIN (TRADJENTA) 5 MG TABS TABLET    Take 5 mg by mouth daily.   METOPROLOL (LOPRESSOR) 100 MG TABLET    Take 100 mg by mouth 2 (two) times daily.   POLYETHYL GLYCOL-PROPYL GLYCOL (SYSTANE) 0.4-0.3 % SOLN    Apply 1 drop to eye daily as needed. Both eyes  Modified Medications   No medications on file  Discontinued Medications   No medications on file    Review of Systems  Constitutional: Negative for chills, activity change and fatigue.  HENT: Negative for sore throat and trouble swallowing.   Eyes: Positive for visual disturbance.  Respiratory: Negative for cough, chest tightness and shortness of breath.   Cardiovascular: Negative for chest pain, palpitations and leg swelling.  Gastrointestinal: Negative for nausea, vomiting, abdominal pain and blood in stool.  Genitourinary: Negative for urgency, frequency and difficulty urinating.  Musculoskeletal: Positive for arthralgias and gait problem.  Skin: Negative for rash.  Neurological: Negative for weakness and headaches.  Psychiatric/Behavioral: Negative for confusion and sleep disturbance. The patient is not nervous/anxious.     Filed Vitals:   01/04/15 0038  BP: 144/76  Pulse: 64  Temp: 97.5 F (36.4 C)  Weight: 225 lb (102.059 kg)  SpO2: 98%   Body mass index is 63.28 kg/(m^2).  Physical Exam  Constitutional: He is oriented to person, place, and time. He appears well-developed and well-nourished.  Sitting in w/c in NAD  HENT:  Mouth/Throat: Oropharynx is clear and moist.  Eyes: Pupils are equal, round, and reactive to light. No scleral icterus.  Neck: Neck supple. No thyromegaly present.  Cardiovascular: Normal rate, regular rhythm, normal heart sounds and intact distal pulses.  Exam reveals no gallop and no friction rub.   No murmur heard. No carotid bruit b/l  Pulmonary/Chest: Effort normal and breath sounds normal. He has no wheezes. He has no rales. He  exhibits no tenderness.  Abdominal: Soft. Bowel sounds are normal. He exhibits no distension, no abdominal bruit, no pulsatile midline mass and no mass. There is no tenderness. There is no rebound and no guarding.  obese  Musculoskeletal:  B/l BKA  Lymphadenopathy:    He has no cervical adenopathy.  Neurological: He is alert and oriented to person, place, and time.  Skin: Skin is warm and dry. No rash noted.  Psychiatric: He has a normal mood and affect. His behavior is normal. Judgment and thought content normal.     Labs reviewed: Admission on 01/01/2015, Discharged on 01/02/2015  Component Date Value  Ref Range Status  . Troponin i, poc 01/01/2015 0.01  0.00 - 0.08 ng/mL Final  . Comment 3 01/01/2015          Final   Comment: Due to the release kinetics of cTnI, a negative result within the first hours of the onset of symptoms does not rule out myocardial infarction with certainty. If myocardial infarction is still suspected, repeat the test at appropriate intervals.   . WBC 01/01/2015 6.6  4.0 - 10.5 K/uL Final  . RBC 01/01/2015 4.36  4.22 - 5.81 MIL/uL Final  . Hemoglobin 01/01/2015 11.7* 13.0 - 17.0 g/dL Final  . HCT 01/01/2015 36.2* 39.0 - 52.0 % Final  . MCV 01/01/2015 83.0  78.0 - 100.0 fL Final  . MCH 01/01/2015 26.8  26.0 - 34.0 pg Final  . MCHC 01/01/2015 32.3  30.0 - 36.0 g/dL Final  . RDW 01/01/2015 16.1* 11.5 - 15.5 % Final  . Platelets 01/01/2015 161  150 - 400 K/uL Final  . Neutrophils Relative % 01/01/2015 53  43 - 77 % Final  . Neutro Abs 01/01/2015 3.5  1.7 - 7.7 K/uL Final  . Lymphocytes Relative 01/01/2015 33  12 - 46 % Final  . Lymphs Abs 01/01/2015 2.2  0.7 - 4.0 K/uL Final  . Monocytes Relative 01/01/2015 11  3 - 12 % Final  . Monocytes Absolute 01/01/2015 0.7  0.1 - 1.0 K/uL Final  . Eosinophils Relative 01/01/2015 2  0 - 5 % Final  . Eosinophils Absolute 01/01/2015 0.2  0.0 - 0.7 K/uL Final  . Basophils Relative 01/01/2015 1  0 - 1 % Final  .  Basophils Absolute 01/01/2015 0.0  0.0 - 0.1 K/uL Final  . Sodium 01/01/2015 143  135 - 145 mmol/L Final  . Potassium 01/01/2015 3.7  3.5 - 5.1 mmol/L Final  . Chloride 01/01/2015 109  96 - 112 mmol/L Final  . CO2 01/01/2015 27  19 - 32 mmol/L Final  . Glucose, Bld 01/01/2015 72  70 - 99 mg/dL Final  . BUN 01/01/2015 29* 6 - 23 mg/dL Final  . Creatinine, Ser 01/01/2015 2.35* 0.50 - 1.35 mg/dL Final  . Calcium 01/01/2015 10.0  8.4 - 10.5 mg/dL Final  . GFR calc non Af Amer 01/01/2015 26* >90 mL/min Final  . GFR calc Af Amer 01/01/2015 30* >90 mL/min Final   Comment: (NOTE) The eGFR has been calculated using the CKD EPI equation. This calculation has not been validated in all clinical situations. eGFR's persistently <90 mL/min signify possible Chronic Kidney Disease.   . Anion gap 01/01/2015 7  5 - 15 Final  . B Natriuretic Peptide 01/01/2015 85.4  0.0 - 100.0 pg/mL Final  . Troponin I 01/01/2015 <0.03  <0.031 ng/mL Final   Comment:        NO INDICATION OF MYOCARDIAL INJURY.   . Troponin I 01/01/2015 0.03  <0.031 ng/mL Final   Comment:        NO INDICATION OF MYOCARDIAL INJURY.   . WBC 01/01/2015 6.3  4.0 - 10.5 K/uL Final  . RBC 01/01/2015 4.25  4.22 - 5.81 MIL/uL Final  . Hemoglobin 01/01/2015 11.5* 13.0 - 17.0 g/dL Final  . HCT 01/01/2015 35.3* 39.0 - 52.0 % Final  . MCV 01/01/2015 83.1  78.0 - 100.0 fL Final  . MCH 01/01/2015 27.1  26.0 - 34.0 pg Final  . MCHC 01/01/2015 32.6  30.0 - 36.0 g/dL Final  . RDW 01/01/2015 16.1* 11.5 - 15.5 % Final  .  Platelets 01/01/2015 179  150 - 400 K/uL Final  . Creatinine, Ser 01/01/2015 2.37* 0.50 - 1.35 mg/dL Final  . GFR calc non Af Amer 01/01/2015 25* >90 mL/min Final  . GFR calc Af Amer 01/01/2015 29* >90 mL/min Final   Comment: (NOTE) The eGFR has been calculated using the CKD EPI equation. This calculation has not been validated in all clinical situations. eGFR's persistently <90 mL/min signify possible Chronic Kidney Disease.     Marland Kitchen MRSA by PCR 01/01/2015 NEGATIVE  NEGATIVE Final   Comment:        The GeneXpert MRSA Assay (FDA approved for NASAL specimens only), is one component of a comprehensive MRSA colonization surveillance program. It is not intended to diagnose MRSA infection nor to guide or monitor treatment for MRSA infections.   . Sodium 01/02/2015 143  135 - 145 mmol/L Final  . Potassium 01/02/2015 3.5  3.5 - 5.1 mmol/L Final  . Chloride 01/02/2015 110  96 - 112 mmol/L Final  . CO2 01/02/2015 25  19 - 32 mmol/L Final  . Glucose, Bld 01/02/2015 75  70 - 99 mg/dL Final  . BUN 01/02/2015 27* 6 - 23 mg/dL Final  . Creatinine, Ser 01/02/2015 2.29* 0.50 - 1.35 mg/dL Final  . Calcium 01/02/2015 9.6  8.4 - 10.5 mg/dL Final  . GFR calc non Af Amer 01/02/2015 26* >90 mL/min Final  . GFR calc Af Amer 01/02/2015 31* >90 mL/min Final   Comment: (NOTE) The eGFR has been calculated using the CKD EPI equation. This calculation has not been validated in all clinical situations. eGFR's persistently <90 mL/min signify possible Chronic Kidney Disease.   . Anion gap 01/02/2015 8  5 - 15 Final  . WBC 01/02/2015 5.1  4.0 - 10.5 K/uL Final  . RBC 01/02/2015 3.73* 4.22 - 5.81 MIL/uL Final  . Hemoglobin 01/02/2015 9.8* 13.0 - 17.0 g/dL Final  . HCT 01/02/2015 30.6* 39.0 - 52.0 % Final  . MCV 01/02/2015 82.0  78.0 - 100.0 fL Final  . MCH 01/02/2015 26.3  26.0 - 34.0 pg Final  . MCHC 01/02/2015 32.0  30.0 - 36.0 g/dL Final  . RDW 01/02/2015 16.2* 11.5 - 15.5 % Final  . Platelets 01/02/2015 182  150 - 400 K/uL Final  . Glucose-Capillary 01/01/2015 60* 70 - 99 mg/dL Final  . Comment 1 01/01/2015 Notify RN   Final  . Comment 2 01/01/2015 Documented in [facility]   Final  . Glucose-Capillary 01/01/2015 110* 70 - 99 mg/dL Final  . Comment 1 01/01/2015 Notify RN   Final  . Comment 2 01/01/2015 Documented in [facility]   Final  . Glucose-Capillary 01/02/2015 96  70 - 99 mg/dL Final  . Troponin I 01/02/2015 <0.03  <0.031 ng/mL Final    Comment:        NO INDICATION OF MYOCARDIAL INJURY.   . Glucose-Capillary 01/02/2015 75  70 - 99 mg/dL Final  . Comment 1 01/02/2015 Notify RN   Final  . Comment 2 01/02/2015 Documented in [facility]   Final  . Glucose-Capillary 01/02/2015 72  70 - 99 mg/dL Final  . Comment 1 01/02/2015 Notify RN   Final  . Comment 2 01/02/2015 Documented in [facility]   Final  . Glucose-Capillary 01/02/2015 115* 70 - 99 mg/dL Final  . Glucose-Capillary 01/02/2015 70  70 - 99 mg/dL Final  . Comment 1 01/02/2015 Notify RN   Final  . Comment 2 01/02/2015 Documented in [place]   Final  . Glucose-Capillary 01/02/2015 138* 70 -  99 mg/dL Final  . Comment 1 01/02/2015 Notify RN   Final  . Comment 2 01/02/2015 Documented in [facility]   Final  Orders Only on 11/29/2014  Component Date Value Ref Range Status  . Iron 11/29/2014 57  42 - 165 ug/dL Final  . UIBC 11/29/2014 154  125 - 400 ug/dL Final  . TIBC 11/29/2014 211* 215 - 435 ug/dL Final  . %SAT 11/29/2014 27  20 - 55 % Final  . WBC 11/29/2014 6.3  4.0 - 10.5 K/uL Final  . RBC 11/29/2014 4.13* 4.22 - 5.81 MIL/uL Final  . Hemoglobin 11/29/2014 10.7* 13.0 - 17.0 g/dL Final  . HCT 11/29/2014 34.8* 39.0 - 52.0 % Final  . MCV 11/29/2014 84.3  78.0 - 100.0 fL Final  . MCH 11/29/2014 25.9* 26.0 - 34.0 pg Final  . MCHC 11/29/2014 30.7  30.0 - 36.0 g/dL Final  . RDW 11/29/2014 16.1* 11.5 - 15.5 % Final  . Platelets 11/29/2014 199  150 - 400 K/uL Final  . MPV 11/29/2014 Not Performed  8.6 - 12.4 fL Final  . Sodium 11/29/2014 140  135 - 145 mEq/L Final  . Potassium 11/29/2014 4.0  3.5 - 5.3 mEq/L Final  . Chloride 11/29/2014 106  96 - 112 mEq/L Final  . CO2 11/29/2014 21  19 - 32 mEq/L Final  . Glucose, Bld 11/29/2014 146* 70 - 99 mg/dL Final  . BUN 11/29/2014 31* 6 - 23 mg/dL Final  . Creat 11/29/2014 2.29* 0.50 - 1.35 mg/dL Final  . Total Bilirubin 11/29/2014 0.3  0.2 - 1.2 mg/dL Final  . Alkaline Phosphatase 11/29/2014 71  39 - 117 U/L Final  . AST 11/29/2014 15  0 - 37  U/L Final  . ALT 11/29/2014 17  0 - 53 U/L Final  . Total Protein 11/29/2014 6.9  6.0 - 8.3 g/dL Final  . Albumin 11/29/2014 3.7  3.5 - 5.2 g/dL Final  . Calcium 11/29/2014 9.1  8.4 - 10.5 mg/dL Final  . Phosphorus 11/29/2014 3.3  2.3 - 4.6 mg/dL Final  . Ferritin 11/29/2014 376* 22 - 322 ng/mL Final  Orders Only on 11/29/2014  Component Date Value Ref Range Status  . PTH 11/29/2014 97* 14 - 64 pg/mL Final  . Calcium 11/29/2014 9.4  8.4 - 10.5 mg/dL Final   Comment:   Interpretive Guide:                              Intact PTH               Calcium                              ----------               ------- Normal Parathyroid           Normal                   Normal Hypoparathyroidism           Low or Low Normal        Low Hyperparathyroidism      Primary                 Normal or High           High      Secondary  High                     Normal or Low      Tertiary                High                     High Non-Parathyroid   Hypercalcemia              Low or Low Normal        High   **Please note change in methodology. If re-baselining is needed, for patients who are being serially tested, please call customer service, within 3 days of collection, to request to add on the appropriate re-baselining test code for the previous methodology, at no charge.**   Orders Only on 11/29/2014  Component Date Value Ref Range Status  . Creatinine, Urine 11/29/2014 97.2   Final   No reference range established.  . Total Protein, Urine 11/29/2014 7  5 - 25 mg/dL Final  . Color, Urine 11/29/2014 YELLOW  YELLOW Final  . APPearance 11/29/2014 CLEAR  CLEAR Final  . Specific Gravity, Urine 11/29/2014 1.012  1.005 - 1.030 Final  . pH 11/29/2014 5.5  5.0 - 8.0 Final  . Glucose, UA 11/29/2014 NEG  NEG mg/dL Final  . Bilirubin Urine 11/29/2014 NEG  NEG Final  . Ketones, ur 11/29/2014 NEG  NEG mg/dL Final  . Hgb urine dipstick 11/29/2014 NEG  NEG Final  . Protein, ur 11/29/2014  NEG  NEG mg/dL Final  . Urobilinogen, UA 11/29/2014 0.2  0.0 - 1.0 mg/dL Final  . Nitrite 11/29/2014 NEG  NEG Final  . Leukocytes, UA 11/29/2014 NEG  NEG Final  . Squamous Epithelial / LPF 11/29/2014 NONE SEEN  RARE Final  . Crystals 11/29/2014 NONE SEEN  NONE SEEN Final  . Casts 11/29/2014 NONE SEEN  NONE SEEN Final  . WBC, UA 11/29/2014 0-2  <3 WBC/hpf Final  . RBC / HPF 11/29/2014 0-2  <3 RBC/hpf Final  . Bacteria, UA 11/29/2014 MANY* RARE Final  Orders Only on 11/03/2014  Component Date Value Ref Range Status  . Cholesterol 11/03/2014 98  0 - 200 mg/dL Final   Comment: ATP III Classification:       < 200        mg/dL        Desirable      200 - 239     mg/dL        Borderline High      >= 240        mg/dL        High     . Triglycerides 11/03/2014 153* <150 mg/dL Final  . HDL 11/03/2014 22* >39 mg/dL Final  . Total CHOL/HDL Ratio 11/03/2014 4.5   Final  . VLDL 11/03/2014 31  0 - 40 mg/dL Final  . LDL Cholesterol 11/03/2014 45  0 - 99 mg/dL Final   Comment:   Total Cholesterol/HDL Ratio:CHD Risk                        Coronary Heart Disease Risk Table                                        Men       Women  1/2 Average Risk              3.4        3.3              Average Risk              5.0        4.4           2X Average Risk              9.6        7.1           3X Average Risk             23.4       11.0 Use the calculated Patient Ratio above and the CHD Risk table  to determine the patient's CHD Risk. ATP III Classification (LDL):       < 100        mg/dL         Optimal      100 - 129     mg/dL         Near or Above Optimal      130 - 159     mg/dL         Borderline High      160 - 189     mg/dL         High       > 190        mg/dL         Very High     . Total Bilirubin 11/03/2014 0.4  0.2 - 1.2 mg/dL Final  . Bilirubin, Direct 11/03/2014 0.1  0.0 - 0.3 mg/dL Final  . Indirect Bilirubin 11/03/2014 0.3  0.2 - 1.2 mg/dL Final  . Alkaline Phosphatase  11/03/2014 69  39 - 117 U/L Final  . AST 11/03/2014 20  0 - 37 U/L Final  . ALT 11/03/2014 22  0 - 53 U/L Final  . Total Protein 11/03/2014 6.9  6.0 - 8.3 g/dL Final  . Albumin 11/03/2014 3.6  3.5 - 5.2 g/dL Final  . Hgb A1c MFr Bld 11/03/2014 8.2* <5.7 % Final   Comment:                                                                        According to the ADA Clinical Practice Recommendations for 2011, when HbA1c is used as a screening test:     >=6.5%   Diagnostic of Diabetes Mellitus            (if abnormal result is confirmed)   5.7-6.4%   Increased risk of developing Diabetes Mellitus   References:Diagnosis and Classification of Diabetes Mellitus,Diabetes NTIR,4431,54(MGQQP 1):S62-S69 and Standards of Medical Care in         Diabetes - 2011,Diabetes Care,2011,34 (Suppl 1):S11-S61.     . Mean Plasma Glucose 11/03/2014 189* <117 mg/dL Final    Dg Chest Port 1 View  01/01/2015   CLINICAL DATA:  74 year old male with central chest pain today.  EXAM: PORTABLE CHEST - 1 VIEW  COMPARISON:  No priors.  FINDINGS: Lung volumes are low. No consolidative airspace disease. No pleural effusions.  No pneumothorax. No pulmonary nodule or mass noted. Pulmonary vasculature and the cardiomediastinal silhouette are within normal limits.  IMPRESSION: 1. Low lung volumes without radiographic evidence of acute cardiopulmonary disease.   Electronically Signed   By: Vinnie Langton M.D.   On: 01/01/2015 17:51   Hospital records reviewed - A1c 8.2%; ECG neg; gaviscon ordered as o/p; cardio to follow o/p stress  Assessment/Plan   ICD-9-CM ICD-10-CM   1. Angina pectoris with multiple risk factors including CAD 413.9 I20.9   2. Uncontrolled secondary diabetes mellitus with stage 3 CKD (GFR 30-59) - cont insulin and oral meds 249.41 E13.29    585.3 N18.3   3. Essential hypertension, benign - controlled on meds 401.1 I10   4. S/P bilateral BKA (below knee amputation) V49.75 Z89.512     Z89.511     --continue meds as Rx  --f/u with cardio for o/p stress  --will follow   Autumne Kallio S. Perlie Gold  [facility] [address] [place], [place] [address] [phone] Office (Wednesdays and Fridays 8 AM - 5 PM) [phone] Cell (Monday-Friday 8 AM - 5 PM)

## 2015-02-18 ENCOUNTER — Non-Acute Institutional Stay (SKILLED_NURSING_FACILITY): Payer: Medicare Other | Admitting: Adult Health

## 2015-02-18 DIAGNOSIS — E1322 Other specified diabetes mellitus with diabetic chronic kidney disease: Secondary | ICD-10-CM

## 2015-02-18 DIAGNOSIS — E1122 Type 2 diabetes mellitus with diabetic chronic kidney disease: Secondary | ICD-10-CM

## 2015-02-18 DIAGNOSIS — N182 Chronic kidney disease, stage 2 (mild): Secondary | ICD-10-CM

## 2015-02-18 DIAGNOSIS — N181 Chronic kidney disease, stage 1: Secondary | ICD-10-CM | POA: Diagnosis not present

## 2015-02-18 DIAGNOSIS — E1365 Other specified diabetes mellitus with hyperglycemia: Secondary | ICD-10-CM

## 2015-02-18 DIAGNOSIS — IMO0002 Reserved for concepts with insufficient information to code with codable children: Secondary | ICD-10-CM

## 2015-02-18 DIAGNOSIS — N189 Chronic kidney disease, unspecified: Secondary | ICD-10-CM | POA: Diagnosis not present

## 2015-02-18 DIAGNOSIS — M19012 Primary osteoarthritis, left shoulder: Secondary | ICD-10-CM | POA: Diagnosis not present

## 2015-02-18 DIAGNOSIS — E1329 Other specified diabetes mellitus with other diabetic kidney complication: Secondary | ICD-10-CM | POA: Diagnosis not present

## 2015-02-18 DIAGNOSIS — E11359 Type 2 diabetes mellitus with proliferative diabetic retinopathy without macular edema: Secondary | ICD-10-CM | POA: Diagnosis not present

## 2015-02-18 DIAGNOSIS — N19 Unspecified kidney failure: Secondary | ICD-10-CM

## 2015-02-18 DIAGNOSIS — N185 Chronic kidney disease, stage 5: Secondary | ICD-10-CM

## 2015-02-18 DIAGNOSIS — N184 Chronic kidney disease, stage 4 (severe): Secondary | ICD-10-CM

## 2015-02-18 DIAGNOSIS — N183 Chronic kidney disease, stage 3 (moderate): Secondary | ICD-10-CM | POA: Diagnosis not present

## 2015-02-18 DIAGNOSIS — I2510 Atherosclerotic heart disease of native coronary artery without angina pectoris: Secondary | ICD-10-CM

## 2015-02-18 DIAGNOSIS — I120 Hypertensive chronic kidney disease with stage 5 chronic kidney disease or end stage renal disease: Secondary | ICD-10-CM | POA: Diagnosis not present

## 2015-02-18 DIAGNOSIS — E113599 Type 2 diabetes mellitus with proliferative diabetic retinopathy without macular edema, unspecified eye: Secondary | ICD-10-CM

## 2015-02-21 DIAGNOSIS — E089 Diabetes mellitus due to underlying condition without complications: Secondary | ICD-10-CM | POA: Diagnosis not present

## 2015-02-25 DIAGNOSIS — N189 Chronic kidney disease, unspecified: Secondary | ICD-10-CM | POA: Diagnosis not present

## 2015-02-25 DIAGNOSIS — E0822 Diabetes mellitus due to underlying condition with diabetic chronic kidney disease: Secondary | ICD-10-CM | POA: Diagnosis not present

## 2015-02-26 DIAGNOSIS — E089 Diabetes mellitus due to underlying condition without complications: Secondary | ICD-10-CM | POA: Diagnosis not present

## 2015-03-01 DIAGNOSIS — H3343 Traction detachment of retina, bilateral: Secondary | ICD-10-CM | POA: Diagnosis not present

## 2015-03-01 DIAGNOSIS — Z961 Presence of intraocular lens: Secondary | ICD-10-CM | POA: Diagnosis not present

## 2015-03-01 DIAGNOSIS — E11351 Type 2 diabetes mellitus with proliferative diabetic retinopathy with macular edema: Secondary | ICD-10-CM | POA: Diagnosis not present

## 2015-03-01 DIAGNOSIS — H34831 Tributary (branch) retinal vein occlusion, right eye: Secondary | ICD-10-CM | POA: Diagnosis not present

## 2015-03-01 DIAGNOSIS — E11359 Type 2 diabetes mellitus with proliferative diabetic retinopathy without macular edema: Secondary | ICD-10-CM | POA: Diagnosis not present

## 2015-03-25 ENCOUNTER — Non-Acute Institutional Stay (SKILLED_NURSING_FACILITY): Payer: Medicare Other | Admitting: Adult Health

## 2015-03-25 DIAGNOSIS — N185 Chronic kidney disease, stage 5: Secondary | ICD-10-CM

## 2015-03-25 DIAGNOSIS — E1365 Other specified diabetes mellitus with hyperglycemia: Secondary | ICD-10-CM

## 2015-03-25 DIAGNOSIS — E1122 Type 2 diabetes mellitus with diabetic chronic kidney disease: Secondary | ICD-10-CM | POA: Diagnosis not present

## 2015-03-25 DIAGNOSIS — E785 Hyperlipidemia, unspecified: Secondary | ICD-10-CM

## 2015-03-25 DIAGNOSIS — I2510 Atherosclerotic heart disease of native coronary artery without angina pectoris: Secondary | ICD-10-CM | POA: Diagnosis not present

## 2015-03-25 DIAGNOSIS — N181 Chronic kidney disease, stage 1: Secondary | ICD-10-CM

## 2015-03-25 DIAGNOSIS — N182 Chronic kidney disease, stage 2 (mild): Secondary | ICD-10-CM | POA: Diagnosis not present

## 2015-03-25 DIAGNOSIS — N189 Chronic kidney disease, unspecified: Secondary | ICD-10-CM

## 2015-03-25 DIAGNOSIS — M150 Primary generalized (osteo)arthritis: Secondary | ICD-10-CM | POA: Diagnosis not present

## 2015-03-25 DIAGNOSIS — F328 Other depressive episodes: Secondary | ICD-10-CM

## 2015-03-25 DIAGNOSIS — IMO0002 Reserved for concepts with insufficient information to code with codable children: Secondary | ICD-10-CM

## 2015-03-25 DIAGNOSIS — E1322 Other specified diabetes mellitus with diabetic chronic kidney disease: Secondary | ICD-10-CM

## 2015-03-25 DIAGNOSIS — F3289 Other specified depressive episodes: Secondary | ICD-10-CM

## 2015-03-25 DIAGNOSIS — N183 Chronic kidney disease, stage 3 unspecified: Secondary | ICD-10-CM

## 2015-03-25 DIAGNOSIS — M159 Polyosteoarthritis, unspecified: Secondary | ICD-10-CM

## 2015-03-25 DIAGNOSIS — K5901 Slow transit constipation: Secondary | ICD-10-CM

## 2015-03-25 DIAGNOSIS — I129 Hypertensive chronic kidney disease with stage 1 through stage 4 chronic kidney disease, or unspecified chronic kidney disease: Secondary | ICD-10-CM

## 2015-03-25 DIAGNOSIS — E1329 Other specified diabetes mellitus with other diabetic kidney complication: Secondary | ICD-10-CM

## 2015-03-25 DIAGNOSIS — N184 Chronic kidney disease, stage 4 (severe): Secondary | ICD-10-CM

## 2015-04-02 DIAGNOSIS — R300 Dysuria: Secondary | ICD-10-CM | POA: Diagnosis not present

## 2015-04-04 DIAGNOSIS — N189 Chronic kidney disease, unspecified: Secondary | ICD-10-CM | POA: Diagnosis not present

## 2015-04-04 DIAGNOSIS — D649 Anemia, unspecified: Secondary | ICD-10-CM | POA: Diagnosis not present

## 2015-04-19 ENCOUNTER — Encounter: Payer: Self-pay | Admitting: Adult Health

## 2015-04-19 NOTE — Progress Notes (Signed)
Patient ID: Marc Wheeler, male   DOB: 1941-10-10, 74 y.o.   MRN: [id number]  [name] living [place] [facility]      No Known Allergies     Chief Complaint  Patient presents with  . Medical Management of Chronic Issues    HPI:  He is a long term resident of this facility being seen for the management of his chronic illnesses. Overall his status is without change. He is not voicing any concerns or complaints today. There are no nursing concerns being voiced today.    Past Medical History  Diagnosis Date  . Diabetes mellitus without complication   . Hyperlipidemia   . Hypertension   . Chronic kidney disease   . Edema   . Depression   . Diabetic retinopathy     Past Surgical History  Procedure Laterality Date  . Leg amputation below knee Bilateral   . Right retinal detachment repair  VITAL SIGNS BP 150/68 mmHg  Pulse 73  Ht  (1.27 m)  Wt 224 lb (101.606 kg)  BMI 63.00 kg/m2   Outpatient Encounter Prescriptions as of 02/18/2015  Medication Sig  . acetaminophen (TYLENOL) 325 MG tablet Take 650 mg by mouth every 4 (four) hours as needed for pain.  Marland Kitchen Alum Hydroxide-Mag Trisilicate (GAVISCON) 80-14.2 MG CHEW Chew 1-2 tablets by mouth every 4 (four) hours as needed (indigestion- pain).  Marland Kitchen amLODipine (NORVASC) 10 MG tablet Take 10 mg by mouth daily.  Marland Kitchen aspirin EC 81 MG tablet Take 81 mg by mouth daily.  Marland Kitchen atorvastatin (LIPITOR) 80 MG tablet Take 80 mg by mouth daily.  Marland Kitchen b complex-vitamin c-folic acid (NEPHRO-VITE) 0.8 MG TABS Take 0.8 mg by mouth at bedtime.  . Calcium Carbonate-Vitamin D 600-200 MG-UNIT TABS Take 1 tablet by mouth daily.  . cloNIDine (CATAPRES - DOSED IN MG/24 HR) 0.2 mg/24hr patch Place 1 patch onto the skin once a week.  . diclofenac sodium (VOLTAREN) 1 % GEL Apply 2 g topically 4 (four) times daily as needed (shoulder pain).  Marland Kitchen docusate sodium (COLACE) 100 MG capsule Take 100 mg by mouth 2 (two) times daily.  . fish oil-omega-3 fatty acids  1000 MG capsule Take 1 g by mouth daily.  . furosemide (LASIX) 40 MG tablet Take 40 mg by mouth daily.  . hydrALAZINE (APRESOLINE) 50 MG tablet Take 1 tablet (50 mg total) by mouth 4 (four) times daily.  . insulin aspart (NOVOLOG) 100 UNIT/ML injection Inject 30 Units into the skin 3 (three) times daily before meals.  . Insulin Glargine (TOUJEO SOLOSTAR) 300 UNIT/ML SOPN Inject 85 Units into the skin daily.  Marland Kitchen linagliptin (TRADJENTA) 5 MG TABS tablet Take 5 mg by mouth daily.  . metoprolol (LOPRESSOR) 100 MG tablet Take 100 mg by mouth 2 (two) times daily.  Bertram Gala Glycol-Propyl Glycol (SYSTANE) 0.4-0.3 % SOLN Apply 1 drop to eye daily as needed. Both eyes      SIGNIFICANT DIAGNOSTIC EXAMS  01-05-15: EKG: SR with first degree AVB    LABS REVIEWED:   08-30-14: urine culture: hafnia alvei: cipro 09-01-14: wbc 6.7 ;hgb 10.8; hct 34; mcv 84.1; plt 183; glucose 128; bun 27; creat 1.97; k+4.7; na++143; phos 3.5; iron 60; tibc 237; ferritin 367 10-06-14: glucose 166; bun 41; creat 2.5; k+4.7; na++143 11-03-14; hgb a1c 8.2; chol 98; ldl 45; trig 153; hdl 22 11-29-14: wbc 6.3; hgb 10.7; hct 34.8; mcv 84.3; plt 199; glucose 146; bun 31; creat 2.29; k+4.0; na++140; liver normal  albumin 3.7; phos 3.3      ROS Constitutional: Negative for malaise/fatigue.  Respiratory: Negative for cough and shortness of breath.   Cardiovascular: Negative for chest pain and palpitations.  Gastrointestinal: Negative for heartburn, abdominal pain and constipation.  Musculoskeletal: Negative for myalgias and back pain.  Skin: Negative.   Neurological: Negative for headaches.  Psychiatric/Behavioral: Negative for depression. The patient is not nervous/anxious.     Physical Exam Constitutional: He is oriented to person, place, and time. He appears well-developed and well-nourished. No distress.  Obese   Neck: Neck supple. No JVD present. No thyromegaly present.  Cardiovascular: Normal rate and regular  rhythm.   Respiratory: Effort normal and breath sounds normal. No respiratory distress.  GI: Soft. Bowel sounds are normal. He exhibits no distension. There is no tenderness.  Musculoskeletal:  Bilateral bka;  Is able to move upper extremities   Neurological: He is alert and oriented to person, place, and time.  Skin: Skin is warm and dry. He is not diaphoretic.       ASSESSMENT/ PLAN:  1. Diabetes: will continue tuojeo 85 units daily; novolog 30 units with meals; tradjenta 5 mg daily; asa 81 mg daily; will monitor his hgb a1c is 8.2   2. Hypertensive renal disease: will continue norvasc 10 mg daily; lopressor 100 mg twice daily; clonidine 0.2 mg patch changed weekly;  hydralazine 50 mg four times daily; asa 81 mg daily   3. Dyslipidemia: will continue lipitor 80 mg daily; fish oil 1 gm daily his ldl is 45   4. 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 Diabetic retinopathy: is on systane eye drop to both eyes is followed by eye doctor   6. Osteoarthritis: is stable; will continue voltaren gel to left should four times daily as needed   7. Constipation: will continue colace twice daily   8. CAD: is stable no complaint of chest pain present; will continue asa 81 mg daily     Will check hgb a1c     Synthia Innocenteborah Jabari Swoveland NP [facility]  Contact [phone] Monday through Friday 8am- 5pm  After hours call [phone]

## 2015-04-21 DIAGNOSIS — D631 Anemia in chronic kidney disease: Secondary | ICD-10-CM | POA: Diagnosis not present

## 2015-04-21 DIAGNOSIS — E1129 Type 2 diabetes mellitus with other diabetic kidney complication: Secondary | ICD-10-CM | POA: Diagnosis not present

## 2015-04-21 DIAGNOSIS — N183 Chronic kidney disease, stage 3 (moderate): Secondary | ICD-10-CM | POA: Diagnosis not present

## 2015-04-21 DIAGNOSIS — I129 Hypertensive chronic kidney disease with stage 1 through stage 4 chronic kidney disease, or unspecified chronic kidney disease: Secondary | ICD-10-CM | POA: Diagnosis not present

## 2015-04-26 ENCOUNTER — Encounter: Payer: Self-pay | Admitting: Internal Medicine

## 2015-04-26 ENCOUNTER — Non-Acute Institutional Stay (SKILLED_NURSING_FACILITY): Payer: Medicare Other | Admitting: Internal Medicine

## 2015-04-26 DIAGNOSIS — R600 Localized edema: Secondary | ICD-10-CM

## 2015-04-26 DIAGNOSIS — Z89512 Acquired absence of left leg below knee: Secondary | ICD-10-CM | POA: Diagnosis not present

## 2015-04-26 DIAGNOSIS — Z89511 Acquired absence of right leg below knee: Secondary | ICD-10-CM

## 2015-04-26 DIAGNOSIS — M159 Polyosteoarthritis, unspecified: Secondary | ICD-10-CM

## 2015-04-26 DIAGNOSIS — E1122 Type 2 diabetes mellitus with diabetic chronic kidney disease: Secondary | ICD-10-CM | POA: Diagnosis not present

## 2015-04-26 DIAGNOSIS — E785 Hyperlipidemia, unspecified: Secondary | ICD-10-CM

## 2015-04-26 DIAGNOSIS — M150 Primary generalized (osteo)arthritis: Secondary | ICD-10-CM | POA: Diagnosis not present

## 2015-04-26 DIAGNOSIS — N183 Chronic kidney disease, stage 3 (moderate): Secondary | ICD-10-CM

## 2015-04-26 DIAGNOSIS — Z794 Long term (current) use of insulin: Secondary | ICD-10-CM | POA: Diagnosis not present

## 2015-04-26 DIAGNOSIS — IMO0002 Reserved for concepts with insufficient information to code with codable children: Secondary | ICD-10-CM

## 2015-04-26 DIAGNOSIS — I10 Essential (primary) hypertension: Secondary | ICD-10-CM

## 2015-04-26 DIAGNOSIS — E1165 Type 2 diabetes mellitus with hyperglycemia: Secondary | ICD-10-CM

## 2015-04-26 NOTE — Progress Notes (Signed)
Patient ID: Marc Wheeler, male   DOB: 03/20/1941, 74 y.o.   MRN: [id number]    DATE: 04/26/15  Location:  [place] [facility] Service: [phone])   Extended Emergency Contact Information Primary Emergency Contact: [place]  [place] Phone: [phone] Relation: Son Secondary Emergency Contact: [place] Phone: [phone] Relation: Other  Advanced Directive information  FULL CODE  Chief Complaint  Patient presents with  . Medical Management of Chronic Issues    HPI:  74 yo male long term resident seen today for f/u. He has no c/o. He saw nephrology last week and kidney fxn stable. He has seen eye specialist recently and eye disease is stable. No nursing issues. He is eating and sleeping well. He uses extra salt on food. He has noticed increased swelling in stumps R>L.  BS elevated with CBGs fluctuating (fasting 80-110s; day 140-190s; occas 200-230s). No low BS reactions   Past Medical History  Diagnosis Date  . Diabetes mellitus without complication   . Hyperlipidemia   . Hypertension   . Chronic kidney disease   . Edema   . Depression   . Diabetic retinopathy     Past Surgical History  Procedure Laterality Date  . Leg amputation below knee Bilateral   . Right retinal detachment repair  1 22 14     Patient Care Team: Gildardo Cranker, DO as PCP - General (Internal Medicine) Gerlene Fee, NP as Nurse Practitioner (Nurse Practitioner)  History   Social History  . Marital Status: Widowed    Spouse Name: N/A  . Number of Children: N/A  . Years of Education: N/A   Occupational History  . Not on file.   Social History Main Topics  . Smoking status: Former Research scientist (life sciences)  . Smokeless tobacco: Not on file  . Alcohol Use: No  . Drug Use: No  . Sexual Activity: No   Other Topics Concern  . Not on file   Social History Narrative     reports that he has quit smoking. He  does not have any smokeless tobacco history on file. He reports that he does not drink alcohol or use illicit drugs.  Immunization History  Administered Date(s) Administered  . Influenza-Unspecified 08/04/2014  . PPD Test 05/18/2011, 05/25/2011    No Known Allergies  Medications: Patient's Medications  New Prescriptions   No medications on file  Previous Medications   ACETAMINOPHEN (TYLENOL) 325 MG TABLET    Take 650 mg by mouth every 4 (four) hours as needed for pain.   ALUM HYDROXIDE-MAG TRISILICATE (GAVISCON) 16-01.0 MG CHEW    Chew 1-2 tablets by mouth every 4 (four) hours as needed (indigestion- pain).   AMLODIPINE (NORVASC) 10 MG TABLET    Take 10 mg by mouth daily.   ASPIRIN EC 81 MG TABLET    Take 81 mg by mouth daily.   ATORVASTATIN (LIPITOR) 80 MG TABLET    Take 80 mg by mouth daily.   B COMPLEX-VITAMIN C-FOLIC ACID (NEPHRO-VITE) 0.8 MG TABS    Take 0.8 mg by mouth at bedtime.   CALCIUM CARBONATE-VITAMIN D 600-200 MG-UNIT TABS    Take 1 tablet by mouth daily.   CLONIDINE (CATAPRES - DOSED IN MG/24 HR) 0.2 MG/24HR PATCH    Place 1 patch onto the skin once a week.   DICLOFENAC SODIUM (VOLTAREN) 1 % GEL    Apply 2 g topically 4 (four) times daily as needed (  shoulder pain).   DOCUSATE SODIUM (COLACE) 100 MG CAPSULE    Take 100 mg by mouth 2 (two) times daily.   FISH OIL-OMEGA-3 FATTY ACIDS 1000 MG CAPSULE    Take 1 g by mouth daily.   FUROSEMIDE (LASIX) 40 MG TABLET    Take 40 mg by mouth daily.   HYDRALAZINE (APRESOLINE) 50 MG TABLET    Take 1 tablet (50 mg total) by mouth 4 (four) times daily.   INSULIN ASPART (NOVOLOG) 100 UNIT/ML INJECTION    Inject 30 Units into the skin 3 (three) times daily before meals.   INSULIN GLARGINE (TOUJEO SOLOSTAR) 300 UNIT/ML SOPN    Inject 85 Units into the skin daily.   LINAGLIPTIN (TRADJENTA) 5 MG TABS TABLET    Take 5 mg by mouth daily.   METOPROLOL (LOPRESSOR) 100 MG TABLET    Take 100 mg by mouth 2 (two) times daily.   POLYETHYL  GLYCOL-PROPYL GLYCOL (SYSTANE) 0.4-0.3 % SOLN    Apply 1 drop to eye daily as needed. Both eyes  Modified Medications   No medications on file  Discontinued Medications   No medications on file    Review of Systems  Cardiovascular:       Stump swelling  All other systems reviewed and are negative.   Filed Vitals:   04/26/15 1604  BP: 139/73  Pulse: 71  Temp: 97.9 F (36.6 C)  SpO2: 98%   There is no weight on file to calculate BMI.  Physical Exam  Constitutional: He is oriented to person, place, and time. He appears well-developed and well-nourished.  Sitting in w/c in NAD  HENT:  Mouth/Throat: Oropharynx is clear and moist.  Eyes: Pupils are equal, round, and reactive to light. No scleral icterus.  Neck: Neck supple. Carotid bruit is not present. No thyromegaly present.  Cardiovascular: Normal rate, regular rhythm and intact distal pulses.  Exam reveals no gallop and no friction rub.   Murmur (1/6 SEM) heard. R>L +1 pitting edema of stump  Pulmonary/Chest: Effort normal and breath sounds normal. He has no wheezes. He has no rales. He exhibits no tenderness.  Abdominal: Soft. Bowel sounds are normal. He exhibits no distension, no abdominal bruit, no pulsatile midline mass and no mass. There is no tenderness. There is no rebound and no guarding.  Musculoskeletal:  B/l BKA  Lymphadenopathy:    He has no cervical adenopathy.  Neurological: He is alert and oriented to person, place, and time.  Skin: Skin is warm and dry. No rash noted.  Psychiatric: He has a normal mood and affect. His behavior is normal.     Labs reviewed: No visits with results within 3 Month(s) from this visit. Latest known visit with results is:  Admission on 01/01/2015, Discharged on 01/02/2015  Component Date Value Ref Range Status  . Troponin i, poc 01/01/2015 0.01  0.00 - 0.08 ng/mL Final  . Comment 3 01/01/2015          Final   Comment: Due to the release kinetics of cTnI, a negative result  within the first hours of the onset of symptoms does not rule out myocardial infarction with certainty. If myocardial infarction is still suspected, repeat the test at appropriate intervals.   . WBC 01/01/2015 6.6  4.0 - 10.5 K/uL Final  . RBC 01/01/2015 4.36  4.22 - 5.81 MIL/uL Final  . Hemoglobin 01/01/2015 11.7* 13.0 - 17.0 g/dL Final  . HCT 01/01/2015 36.2* 39.0 - 52.0 % Final  . MCV 01/01/2015  83.0  78.0 - 100.0 fL Final  . MCH 01/01/2015 26.8  26.0 - 34.0 pg Final  . MCHC 01/01/2015 32.3  30.0 - 36.0 g/dL Final  . RDW 01/01/2015 16.1* 11.5 - 15.5 % Final  . Platelets 01/01/2015 161  150 - 400 K/uL Final  . Neutrophils Relative % 01/01/2015 53  43 - 77 % Final  . Neutro Abs 01/01/2015 3.5  1.7 - 7.7 K/uL Final  . Lymphocytes Relative 01/01/2015 33  12 - 46 % Final  . Lymphs Abs 01/01/2015 2.2  0.7 - 4.0 K/uL Final  . Monocytes Relative 01/01/2015 11  3 - 12 % Final  . Monocytes Absolute 01/01/2015 0.7  0.1 - 1.0 K/uL Final  . Eosinophils Relative 01/01/2015 2  0 - 5 % Final  . Eosinophils Absolute 01/01/2015 0.2  0.0 - 0.7 K/uL Final  . Basophils Relative 01/01/2015 1  0 - 1 % Final  . Basophils Absolute 01/01/2015 0.0  0.0 - 0.1 K/uL Final  . Sodium 01/01/2015 143  135 - 145 mmol/L Final  . Potassium 01/01/2015 3.7  3.5 - 5.1 mmol/L Final  . Chloride 01/01/2015 109  96 - 112 mmol/L Final  . CO2 01/01/2015 27  19 - 32 mmol/L Final  . Glucose, Bld 01/01/2015 72  70 - 99 mg/dL Final  . BUN 01/01/2015 29* 6 - 23 mg/dL Final  . Creatinine, Ser 01/01/2015 2.35* 0.50 - 1.35 mg/dL Final  . Calcium 01/01/2015 10.0  8.4 - 10.5 mg/dL Final  . GFR calc non Af Amer 01/01/2015 26* >90 mL/min Final  . GFR calc Af Amer 01/01/2015 30* >90 mL/min Final   Comment: (NOTE) The eGFR has been calculated using the CKD EPI equation. This calculation has not been validated in all clinical situations. eGFR's persistently <90 mL/min signify possible Chronic Kidney Disease.   . Anion gap 01/01/2015  7  5 - 15 Final  . B Natriuretic Peptide 01/01/2015 85.4  0.0 - 100.0 pg/mL Final  . Troponin I 01/01/2015 <0.03  <0.031 ng/mL Final   Comment:        NO INDICATION OF MYOCARDIAL INJURY.   . Troponin I 01/01/2015 0.03  <0.031 ng/mL Final   Comment:        NO INDICATION OF MYOCARDIAL INJURY.   . WBC 01/01/2015 6.3  4.0 - 10.5 K/uL Final  . RBC 01/01/2015 4.25  4.22 - 5.81 MIL/uL Final  . Hemoglobin 01/01/2015 11.5* 13.0 - 17.0 g/dL Final  . HCT 01/01/2015 35.3* 39.0 - 52.0 % Final  . MCV 01/01/2015 83.1  78.0 - 100.0 fL Final  . MCH 01/01/2015 27.1  26.0 - 34.0 pg Final  . MCHC 01/01/2015 32.6  30.0 - 36.0 g/dL Final  . RDW 01/01/2015 16.1* 11.5 - 15.5 % Final  . Platelets 01/01/2015 179  150 - 400 K/uL Final  . Creatinine, Ser 01/01/2015 2.37* 0.50 - 1.35 mg/dL Final  . GFR calc non Af Amer 01/01/2015 25* >90 mL/min Final  . GFR calc Af Amer 01/01/2015 29* >90 mL/min Final   Comment: (NOTE) The eGFR has been calculated using the CKD EPI equation. This calculation has not been validated in all clinical situations. eGFR's persistently <90 mL/min signify possible Chronic Kidney Disease.   Marland Kitchen MRSA by PCR 01/01/2015 NEGATIVE  NEGATIVE Final   Comment:        The GeneXpert MRSA Assay (FDA approved for NASAL specimens only), is one component of a comprehensive MRSA colonization surveillance program. It is not  intended to diagnose MRSA infection nor to guide or monitor treatment for MRSA infections.   . Sodium 01/02/2015 143  135 - 145 mmol/L Final  . Potassium 01/02/2015 3.5  3.5 - 5.1 mmol/L Final  . Chloride 01/02/2015 110  96 - 112 mmol/L Final  . CO2 01/02/2015 25  19 - 32 mmol/L Final  . Glucose, Bld 01/02/2015 75  70 - 99 mg/dL Final  . BUN 01/02/2015 27* 6 - 23 mg/dL Final  . Creatinine, Ser 01/02/2015 2.29* 0.50 - 1.35 mg/dL Final  . Calcium 01/02/2015 9.6  8.4 - 10.5 mg/dL Final  . GFR calc non Af Amer 01/02/2015 26* >90 mL/min Final  . GFR calc Af Amer 01/02/2015  31* >90 mL/min Final   Comment: (NOTE) The eGFR has been calculated using the CKD EPI equation. This calculation has not been validated in all clinical situations. eGFR's persistently <90 mL/min signify possible Chronic Kidney Disease.   . Anion gap 01/02/2015 8  5 - 15 Final  . WBC 01/02/2015 5.1  4.0 - 10.5 K/uL Final  . RBC 01/02/2015 3.73* 4.22 - 5.81 MIL/uL Final  . Hemoglobin 01/02/2015 9.8* 13.0 - 17.0 g/dL Final  . HCT 01/02/2015 30.6* 39.0 - 52.0 % Final  . MCV 01/02/2015 82.0  78.0 - 100.0 fL Final  . MCH 01/02/2015 26.3  26.0 - 34.0 pg Final  . MCHC 01/02/2015 32.0  30.0 - 36.0 g/dL Final  . RDW 01/02/2015 16.2* 11.5 - 15.5 % Final  . Platelets 01/02/2015 182  150 - 400 K/uL Final  . Glucose-Capillary 01/01/2015 60* 70 - 99 mg/dL Final  . Comment 1 01/01/2015 Notify RN   Final  . Comment 2 01/01/2015 Documented in Char   Final  . Glucose-Capillary 01/01/2015 110* 70 - 99 mg/dL Final  . Comment 1 01/01/2015 Notify RN   Final  . Comment 2 01/01/2015 Documented in Char   Final  . Glucose-Capillary 01/02/2015 96  70 - 99 mg/dL Final  . Troponin I 01/02/2015 <0.03  <0.031 ng/mL Final   Comment:        NO INDICATION OF MYOCARDIAL INJURY.   . Glucose-Capillary 01/02/2015 75  70 - 99 mg/dL Final  . Comment 1 01/02/2015 Notify RN   Final  . Comment 2 01/02/2015 Documented in Char   Final  . Glucose-Capillary 01/02/2015 72  70 - 99 mg/dL Final  . Comment 1 01/02/2015 Notify RN   Final  . Comment 2 01/02/2015 Documented in Char   Final  . Glucose-Capillary 01/02/2015 115* 70 - 99 mg/dL Final  . Glucose-Capillary 01/02/2015 70  70 - 99 mg/dL Final  . Comment 1 01/02/2015 Notify RN   Final  . Comment 2 01/02/2015 Documented in Char   Final  . Glucose-Capillary 01/02/2015 138* 70 - 99 mg/dL Final  . Comment 1 01/02/2015 Notify RN   Final  . Comment 2 01/02/2015 Documented in Char   Final    No results found.   Assessment/Plan   ICD-9-CM ICD-10-CM   1. Edema 782.3 R60.9     2. Essential hypertension, benign 401.1 I10   3. DM (diabetes mellitus), type 2, uncontrolled, with renal complications 790.24 O97.35     E11.65   4. Primary osteoarthritis involving multiple joints 715.09 M15.0   5. Hyperlipidemia LDL goal <100 272.4 E78.5   6. S/P bilateral BKA (below knee amputation) V49.75 Z89.512     Z89.511     --reduce Na intake to reduce swelling  --continue current meds  as ordered  --f/u with nephrology as scheduled  --will follow  [facility] S. Perlie Gold  [facility] [address] [place], [place] [address] [phone] Cell (Monday-Friday 8 AM - 5 PM) [phone] After 5 PM and follow prompts

## 2015-05-01 NOTE — Progress Notes (Signed)
Patient ID: Marc Wheeler, male   DOB: [birth year], 74 y.o.   MRN: [id number]  Marc Wheeler living [place]     No Known Allergies     Chief Complaint  Patient presents with  . Medical Management of Chronic Issues    HPI:  He is a long term resident of this facility being seen for the management of his chronic illnesses. Overall his status is stable. He tells me that he is feeling good; there are no concerns or complaints today. There are no nursing concerns today.    Past Medical History  Diagnosis Date  . Diabetes mellitus without complication   . Hyperlipidemia   . Hypertension   . Chronic kidney disease   . Edema   . Depression   . Diabetic retinopathy     Past Surgical History  Procedure Laterality Date  . Leg amputation below knee Bilateral   . Right retinal detachment repair  VITAL SIGNS BP 150/76 mmHg  Pulse 70  Ht  (1.27 m)  Wt 223 lb (101.152 kg)  BMI 62.71 kg/m2   Outpatient Encounter Prescriptions as of 03/25/2015  Medication Sig  . acetaminophen (TYLENOL) 325 MG tablet Take 650 mg by mouth every 4 (four) hours as needed for pain.  Marland Kitchen Alum Hydroxide-Mag Trisilicate (GAVISCON) 80-14.2 MG CHEW Chew 1-2 tablets by mouth every 4 (four) hours as needed (indigestion- pain).  Marland Kitchen amLODipine (NORVASC) 10 MG tablet Take 10 mg by mouth daily.  Marland Kitchen aspirin EC 81 MG tablet Take 81 mg by mouth daily.  Marland Kitchen atorvastatin (LIPITOR) 80 MG tablet Take 80 mg by mouth daily.  Marland Kitchen b complex-vitamin c-folic acid (NEPHRO-VITE) 0.8 MG TABS Take 0.8 mg by mouth at bedtime.  . Calcium Carbonate-Vitamin D 600-200 MG-UNIT TABS Take 1 tablet by mouth daily.  . cloNIDine (CATAPRES - DOSED IN MG/24 HR) 0.2 mg/24hr patch Place 1 patch onto the skin once a week.  . diclofenac sodium (VOLTAREN) 1 % GEL Apply 2 g topically 4 (four) times daily as needed (shoulder pain).  Marland Kitchen docusate sodium (COLACE) 100 MG capsule Take 100 mg by mouth 2 (two) times daily.  . fish oil-omega-3 fatty  acids 1000 MG capsule Take 1 g by mouth daily.  . furosemide (LASIX) 40 MG tablet Take 40 mg by mouth daily.  . hydrALAZINE (APRESOLINE) 50 MG tablet Take 1 tablet (50 mg total) by mouth 4 (four) times daily.  . insulin aspart (NOVOLOG) 100 UNIT/ML injection Inject 30 Units into the skin 3 (three) times daily before meals.  . Insulin Glargine (TOUJEO SOLOSTAR) 300 UNIT/ML SOPN Inject 85 Units into the skin daily.  Marland Kitchen linagliptin (TRADJENTA) 5 MG TABS tablet Take 5 mg by mouth daily.  . metoprolol (LOPRESSOR) 100 MG tablet Take 100 mg by mouth 2 (two) times daily.  Bertram Gala Glycol-Propyl Glycol (SYSTANE) 0.4-0.3 % SOLN Apply 1 drop to eye daily as needed. Both eyes     SIGNIFICANT DIAGNOSTIC EXAMS  01-05-15: EKG: SR with first degree AVB    LABS REVIEWED:   08-30-14: urine culture: hafnia alvei: cipro 09-01-14: wbc 6.7 ;hgb 10.8; hct 34; mcv 84.1; plt 183; glucose 128; bun 27; creat 1.97; k+4.7; na++143; phos 3.5; iron 60; tibc 237; ferritin 367 10-06-14: glucose 166; bun 41; creat 2.5; k+4.7; na++143 11-03-14; hgb a1c 8.2; chol 98; ldl 45; trig 153; hdl 22 11-29-14: wbc 6.3; hgb 10.7; hct 34.8; mcv 84.3; plt 199; glucose 146; bun 31; creat 2.29; k+4.0; na++140; liver  normal albumin 3.7; phos 3.3  02-21-15: hgb a1c 6.6 02-25-15: wbc 6.2; hgb 10.7; hct 33.7; mcv 83; plt 200; glucose 128; bun 30;creat 1.96; k+4.3; na++141; liver normal albumin 3.7; phos 3.7; ferritin 202; iron 56; tibc 202   02-27-15: urine culture: multiple bacteria      Review of Systems  Constitutional: Negative for malaise/fatigue.  Respiratory: Negative for cough and shortness of breath.   Cardiovascular: Negative for chest pain and palpitations.  Gastrointestinal: Negative for heartburn, abdominal pain and constipation.  Musculoskeletal: Negative for myalgias and joint pain.  Skin: Negative.   Neurological: Negative for weakness and headaches.  Psychiatric/Behavioral: The patient is not nervous/anxious.       Physical Exam Constitutional: He is oriented to person, place, and time. He appears well-developed and well-nourished. No distress.  Obese   Neck: Neck supple. No JVD present. No thyromegaly present.  Cardiovascular: Normal rate and regular rhythm.   Respiratory: Effort normal and breath sounds normal. No respiratory distress.  GI: Soft. Bowel sounds are normal. He exhibits no distension. There is no tenderness.  Musculoskeletal:  Bilateral bka;  Is able to move upper extremities   Neurological: He is alert and oriented to person, place, and time.  Skin: Skin is warm and dry. He is not diaphoretic.       ASSESSMENT/ PLAN:  1. Diabetes: will continue tuojeo 85 units daily; novolog 30 units with meals; tradjenta 5 mg daily; asa 81 mg daily; will monitor his hgb a1c is 6.6   2. Hypertensive renal disease: will continue norvasc 10 mg daily; lopressor 100 mg twice daily; clonidine 0.2 mg patch changed weekly;  hydralazine 50 mg four times daily; asa 81 mg daily   3. Dyslipidemia: will continue lipitor 80 mg daily; fish oil 1 gm daily his ldl is 45   4. 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 Diabetic retinopathy: is on systane eye drop to both eyes is followed by eye doctor   6. Osteoarthritis: is stable; will continue voltaren gel to left shoulder four times daily as needed   7. Constipation: will continue colace twice daily   8. CAD: is stable no complaint of chest pain present; will continue asa 81 mg daily    Synthia Innocenteborah Deoni Cosey NP [facility]  Contact [phone] Monday through Friday 8am- 5pm  After hours call [phone]

## 2015-05-23 ENCOUNTER — Non-Acute Institutional Stay (SKILLED_NURSING_FACILITY): Payer: Medicare Other | Admitting: Adult Health

## 2015-05-23 DIAGNOSIS — N181 Chronic kidney disease, stage 1: Secondary | ICD-10-CM

## 2015-05-23 DIAGNOSIS — E1329 Other specified diabetes mellitus with other diabetic kidney complication: Secondary | ICD-10-CM

## 2015-05-23 DIAGNOSIS — K5901 Slow transit constipation: Secondary | ICD-10-CM

## 2015-05-23 DIAGNOSIS — N185 Chronic kidney disease, stage 5: Secondary | ICD-10-CM | POA: Diagnosis not present

## 2015-05-23 DIAGNOSIS — E1365 Other specified diabetes mellitus with hyperglycemia: Secondary | ICD-10-CM

## 2015-05-23 DIAGNOSIS — N183 Chronic kidney disease, stage 3 unspecified: Secondary | ICD-10-CM

## 2015-05-23 DIAGNOSIS — E1122 Type 2 diabetes mellitus with diabetic chronic kidney disease: Secondary | ICD-10-CM | POA: Diagnosis not present

## 2015-05-23 DIAGNOSIS — M150 Primary generalized (osteo)arthritis: Secondary | ICD-10-CM | POA: Diagnosis not present

## 2015-05-23 DIAGNOSIS — F3289 Other specified depressive episodes: Secondary | ICD-10-CM

## 2015-05-23 DIAGNOSIS — F328 Other depressive episodes: Secondary | ICD-10-CM

## 2015-05-23 DIAGNOSIS — E785 Hyperlipidemia, unspecified: Secondary | ICD-10-CM

## 2015-05-23 DIAGNOSIS — N184 Chronic kidney disease, stage 4 (severe): Secondary | ICD-10-CM

## 2015-05-23 DIAGNOSIS — M159 Polyosteoarthritis, unspecified: Secondary | ICD-10-CM

## 2015-05-23 DIAGNOSIS — N182 Chronic kidney disease, stage 2 (mild): Secondary | ICD-10-CM | POA: Diagnosis not present

## 2015-05-23 DIAGNOSIS — I2510 Atherosclerotic heart disease of native coronary artery without angina pectoris: Secondary | ICD-10-CM

## 2015-05-23 DIAGNOSIS — E1322 Other specified diabetes mellitus with diabetic chronic kidney disease: Secondary | ICD-10-CM

## 2015-05-23 DIAGNOSIS — IMO0002 Reserved for concepts with insufficient information to code with codable children: Secondary | ICD-10-CM

## 2015-05-23 DIAGNOSIS — I129 Hypertensive chronic kidney disease with stage 1 through stage 4 chronic kidney disease, or unspecified chronic kidney disease: Secondary | ICD-10-CM

## 2015-05-23 DIAGNOSIS — N189 Chronic kidney disease, unspecified: Secondary | ICD-10-CM | POA: Diagnosis not present

## 2015-05-25 DIAGNOSIS — E089 Diabetes mellitus due to underlying condition without complications: Secondary | ICD-10-CM | POA: Diagnosis not present

## 2015-06-01 DIAGNOSIS — N189 Chronic kidney disease, unspecified: Secondary | ICD-10-CM | POA: Diagnosis not present

## 2015-06-01 DIAGNOSIS — D649 Anemia, unspecified: Secondary | ICD-10-CM | POA: Diagnosis not present

## 2015-06-01 DIAGNOSIS — E088 Diabetes mellitus due to underlying condition with unspecified complications: Secondary | ICD-10-CM | POA: Diagnosis not present

## 2015-07-11 ENCOUNTER — Non-Acute Institutional Stay (SKILLED_NURSING_FACILITY): Payer: Medicare Other | Admitting: Adult Health

## 2015-07-11 DIAGNOSIS — N189 Chronic kidney disease, unspecified: Secondary | ICD-10-CM

## 2015-07-11 DIAGNOSIS — E1365 Other specified diabetes mellitus with hyperglycemia: Secondary | ICD-10-CM

## 2015-07-11 DIAGNOSIS — N184 Chronic kidney disease, stage 4 (severe): Secondary | ICD-10-CM

## 2015-07-11 DIAGNOSIS — E1122 Type 2 diabetes mellitus with diabetic chronic kidney disease: Secondary | ICD-10-CM | POA: Diagnosis not present

## 2015-07-11 DIAGNOSIS — N183 Chronic kidney disease, stage 3 (moderate): Secondary | ICD-10-CM

## 2015-07-11 DIAGNOSIS — I129 Hypertensive chronic kidney disease with stage 1 through stage 4 chronic kidney disease, or unspecified chronic kidney disease: Secondary | ICD-10-CM

## 2015-07-11 DIAGNOSIS — E113599 Type 2 diabetes mellitus with proliferative diabetic retinopathy without macular edema, unspecified eye: Secondary | ICD-10-CM

## 2015-07-11 DIAGNOSIS — E785 Hyperlipidemia, unspecified: Secondary | ICD-10-CM

## 2015-07-11 DIAGNOSIS — M150 Primary generalized (osteo)arthritis: Secondary | ICD-10-CM

## 2015-07-11 DIAGNOSIS — N185 Chronic kidney disease, stage 5: Secondary | ICD-10-CM

## 2015-07-11 DIAGNOSIS — E1329 Other specified diabetes mellitus with other diabetic kidney complication: Secondary | ICD-10-CM | POA: Diagnosis not present

## 2015-07-11 DIAGNOSIS — M159 Polyosteoarthritis, unspecified: Secondary | ICD-10-CM

## 2015-07-11 DIAGNOSIS — I2510 Atherosclerotic heart disease of native coronary artery without angina pectoris: Secondary | ICD-10-CM | POA: Diagnosis not present

## 2015-07-11 DIAGNOSIS — R609 Edema, unspecified: Secondary | ICD-10-CM | POA: Diagnosis not present

## 2015-07-11 DIAGNOSIS — E1322 Other specified diabetes mellitus with diabetic chronic kidney disease: Secondary | ICD-10-CM

## 2015-07-11 DIAGNOSIS — N182 Chronic kidney disease, stage 2 (mild): Secondary | ICD-10-CM | POA: Diagnosis not present

## 2015-07-11 DIAGNOSIS — E11359 Type 2 diabetes mellitus with proliferative diabetic retinopathy without macular edema: Secondary | ICD-10-CM

## 2015-07-11 DIAGNOSIS — IMO0002 Reserved for concepts with insufficient information to code with codable children: Secondary | ICD-10-CM

## 2015-07-11 DIAGNOSIS — N181 Chronic kidney disease, stage 1: Secondary | ICD-10-CM

## 2015-07-15 DIAGNOSIS — Z23 Encounter for immunization: Secondary | ICD-10-CM | POA: Diagnosis not present

## 2015-08-15 ENCOUNTER — Non-Acute Institutional Stay (SKILLED_NURSING_FACILITY): Payer: Medicare Other | Admitting: Adult Health

## 2015-08-15 DIAGNOSIS — E1365 Other specified diabetes mellitus with hyperglycemia: Secondary | ICD-10-CM

## 2015-08-15 DIAGNOSIS — Z89511 Acquired absence of right leg below knee: Secondary | ICD-10-CM

## 2015-08-15 DIAGNOSIS — I25119 Atherosclerotic heart disease of native coronary artery with unspecified angina pectoris: Secondary | ICD-10-CM | POA: Diagnosis not present

## 2015-08-15 DIAGNOSIS — E1322 Other specified diabetes mellitus with diabetic chronic kidney disease: Secondary | ICD-10-CM | POA: Diagnosis not present

## 2015-08-15 DIAGNOSIS — R601 Generalized edema: Secondary | ICD-10-CM | POA: Diagnosis not present

## 2015-08-15 DIAGNOSIS — E1122 Type 2 diabetes mellitus with diabetic chronic kidney disease: Secondary | ICD-10-CM

## 2015-08-15 DIAGNOSIS — I129 Hypertensive chronic kidney disease with stage 1 through stage 4 chronic kidney disease, or unspecified chronic kidney disease: Secondary | ICD-10-CM

## 2015-08-15 DIAGNOSIS — E785 Hyperlipidemia, unspecified: Secondary | ICD-10-CM | POA: Diagnosis not present

## 2015-08-15 DIAGNOSIS — Z89512 Acquired absence of left leg below knee: Secondary | ICD-10-CM | POA: Diagnosis not present

## 2015-08-15 DIAGNOSIS — IMO0002 Reserved for concepts with insufficient information to code with codable children: Secondary | ICD-10-CM

## 2015-08-15 DIAGNOSIS — N183 Chronic kidney disease, stage 3 (moderate): Secondary | ICD-10-CM

## 2015-08-17 DIAGNOSIS — N189 Chronic kidney disease, unspecified: Secondary | ICD-10-CM | POA: Diagnosis not present

## 2015-08-22 ENCOUNTER — Encounter: Payer: Self-pay | Admitting: Adult Health

## 2015-08-22 NOTE — Progress Notes (Signed)
Patient ID: Marc Wheeler, male   DOB: 1941-09-04, 74 y.o.   MRN: [id number]   Facility: [facility]      No Known Allergies  Chief Complaint  Patient presents with  . Medical Management of Chronic Issues    HPI:  He is a long term resident of this facility being seen for the management of his chronic illnesses. His status remains stable. He is not voicing any complaints or concerns today; stating that he is feeling good. There are no nursing concerns at this time.     Past Medical History  Diagnosis Date  . Diabetes mellitus without complication (HCC)   . Hyperlipidemia   . Hypertension   . Chronic kidney disease   . Edema   . Depression   . Diabetic retinopathy [facility])     Past Surgical History  Procedure Laterality Date  . Leg amputation below knee Bilateral   . Right retinal detachment repair  VITAL SIGNS BP 144/78 mmHg  Pulse 78  Ht  (1.27 m)  Wt 222 lb (100.699 kg)  BMI 62.43 kg/m2  Patient's Medications  New Prescriptions   No medications on file  Previous Medications   ACETAMINOPHEN (TYLENOL) 325 MG TABLET    Take 650 mg by mouth every 4 (four) hours as needed for pain.   ALUM HYDROXIDE-MAG TRISILICATE (GAVISCON) 80-14.2 MG CHEW    Chew 1-2 tablets by mouth every 4 (four) hours as needed (indigestion- pain).   AMLODIPINE (NORVASC) 10 MG TABLET    Take 10 mg by mouth daily.   ASPIRIN EC 81 MG TABLET    Take 81 mg by mouth daily.   ATORVASTATIN (LIPITOR) 80 MG TABLET    Take 80 mg by mouth daily.   B COMPLEX-VITAMIN C-FOLIC ACID (NEPHRO-VITE) 0.8 MG TABS    Take 0.8 mg by mouth at bedtime.   CALCIUM CARBONATE-VITAMIN D 600-200 MG-UNIT TABS    Take 1 tablet by mouth daily.   CLONIDINE (CATAPRES - DOSED IN MG/24 HR) 0.2 MG/24HR PATCH    Place 1 patch onto the skin once a week.   DICLOFENAC SODIUM (VOLTAREN) 1 % GEL    Apply 2 g topically 4 (four) times daily as needed (shoulder pain).   DOCUSATE SODIUM (COLACE) 100 MG CAPSULE     Take 100 mg by mouth 2 (two) times daily.   FISH OIL-OMEGA-3 FATTY ACIDS 1000 MG CAPSULE    Take 1 g by mouth daily.   FUROSEMIDE (LASIX) 40 MG TABLET    Take 40 mg by mouth daily.   HYDRALAZINE (APRESOLINE) 50 MG TABLET    Take 1 tablet (50 mg total) by mouth 4 (four) times daily.   INSULIN ASPART (NOVOLOG) 100 UNIT/ML INJECTION    Inject 30 Units into the skin 3 (three) times daily before meals.   INSULIN GLARGINE (TOUJEO SOLOSTAR) 300 UNIT/ML SOPN    Inject 85 Units into the skin daily.   LINAGLIPTIN (TRADJENTA) 5 MG TABS TABLET    Take 5 mg by mouth daily.   METOPROLOL (LOPRESSOR) 100 MG TABLET    Take 100 mg by mouth 2 (two) times daily.   POLYETHYL GLYCOL-PROPYL GLYCOL (SYSTANE) 0.4-0.3 % SOLN    Apply 1 drop to eye daily as needed. Both eyes  Modified Medications   No medications on file  Discontinued Medications   No medications on file     SIGNIFICANT DIAGNOSTIC EXAMS   01-05-15: EKG: SR with first degree AVB  LABS REVIEWED:   08-30-14: urine culture: hafnia alvei: cipro 09-01-14: wbc 6.7 ;hgb 10.8; hct 34; mcv 84.1; plt 183; glucose 128; bun 27; creat 1.97; k+4.7; na++143; phos 3.5; iron 60; tibc 237; ferritin 367 10-06-14: glucose 166; bun 41; creat 2.5; k+4.7; na++143 11-03-14; hgb a1c 8.2; chol 98; ldl 45; trig 153; hdl 22 11-29-14: wbc 6.3; hgb 10.7; hct 34.8; mcv 84.3; plt 199; glucose 146; bun 31; creat 2.29; k+4.0; na++140; liver normal albumin 3.7; phos 3.3  02-21-15: hgb a1c 6.6 02-25-15: wbc 6.2; hgb 10.7; hct 33.7; mcv 83; plt 200; glucose 128; bun 30;creat 1.96; k+4.3; na++141; liver normal albumin 3.7; phos 3.7; ferritin 202; iron 56; tibc 202   02-27-15: urine culture: multiple bacteria      Review of Systems Constitutional: Negative for malaise/fatigue.  Respiratory: Negative for cough and shortness of breath.   Cardiovascular: Negative for chest pain and palpitations.  Gastrointestinal: Negative for heartburn, abdominal pain and constipation.    Musculoskeletal: Negative for myalgias and joint pain.  Skin: Negative.   Neurological: Negative for weakness and headaches.  Psychiatric/Behavioral: The patient is not nervous/anxious.     Physical Exam Constitutional: He is oriented to person, place, and time. He appears well-developed and well-nourished. No distress.  Obese   Neck: Neck supple. No JVD present. No thyromegaly present.  Cardiovascular: Normal rate and regular rhythm.   Respiratory: Effort normal and breath sounds normal. No respiratory distress.  GI: Soft. Bowel sounds are normal. He exhibits no distension. There is no tenderness.  Musculoskeletal:  Bilateral bka;  Is able to move upper extremities   Neurological: He is alert and oriented to person, place, and time.  Skin: Skin is warm and dry. He is not diaphoretic.       ASSESSMENT/ PLAN:  1. Diabetes: will continue tuojeo 85 units daily; novolog 30 units with meals; tradjenta 5 mg daily; asa 81 mg daily; will monitor his hgb a1c is 6.6   2. Hypertensive renal disease: will continue norvasc 10 mg daily; lopressor 100 mg twice daily; clonidine 0.2 mg patch changed weekly;  hydralazine 50 mg four times daily; asa 81 mg daily   3. Dyslipidemia: will continue lipitor 80 mg daily; fish oil 1 gm daily his ldl is 45   4. 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 Diabetic retinopathy: is on systane eye drop to both eyes is followed by eye doctor   6. Osteoarthritis: is stable; will continue voltaren gel to left shoulder four times daily as needed   7. Constipation: will continue colace twice daily   8. CAD: is stable no complaint of chest pain present; will continue asa 81 mg daily   Will check hgb a1c       Synthia Innocenteborah Green NP [facility]  Contact [phone] Monday through Friday 8am- 5pm  After hours call [phone]

## 2015-08-25 ENCOUNTER — Emergency Department (HOSPITAL_COMMUNITY): Payer: Medicare Other

## 2015-08-25 ENCOUNTER — Emergency Department (HOSPITAL_COMMUNITY)
Admission: EM | Admit: 2015-08-25 | Discharge: 2015-08-25 | Disposition: A | Discharge status: Home / Self Care | Payer: Medicare Other | Attending: Emergency Medicine | Admitting: Emergency Medicine

## 2015-08-25 ENCOUNTER — Encounter (HOSPITAL_COMMUNITY): Payer: Self-pay

## 2015-08-25 DIAGNOSIS — E11649 Type 2 diabetes mellitus with hypoglycemia without coma: Secondary | ICD-10-CM | POA: Diagnosis not present

## 2015-08-25 DIAGNOSIS — Z794 Long term (current) use of insulin: Secondary | ICD-10-CM | POA: Insufficient documentation

## 2015-08-25 DIAGNOSIS — Z79899 Other long term (current) drug therapy: Secondary | ICD-10-CM | POA: Diagnosis not present

## 2015-08-25 DIAGNOSIS — E162 Hypoglycemia, unspecified: Secondary | ICD-10-CM

## 2015-08-25 DIAGNOSIS — E785 Hyperlipidemia, unspecified: Secondary | ICD-10-CM | POA: Insufficient documentation

## 2015-08-25 DIAGNOSIS — N189 Chronic kidney disease, unspecified: Secondary | ICD-10-CM | POA: Diagnosis not present

## 2015-08-25 DIAGNOSIS — E161 Other hypoglycemia: Secondary | ICD-10-CM | POA: Diagnosis not present

## 2015-08-25 DIAGNOSIS — R7309 Other abnormal glucose: Secondary | ICD-10-CM | POA: Diagnosis not present

## 2015-08-25 DIAGNOSIS — Z7982 Long term (current) use of aspirin: Secondary | ICD-10-CM | POA: Insufficient documentation

## 2015-08-25 DIAGNOSIS — I959 Hypotension, unspecified: Secondary | ICD-10-CM | POA: Diagnosis not present

## 2015-08-25 DIAGNOSIS — Z87891 Personal history of nicotine dependence: Secondary | ICD-10-CM | POA: Insufficient documentation

## 2015-08-25 DIAGNOSIS — F329 Major depressive disorder, single episode, unspecified: Secondary | ICD-10-CM | POA: Insufficient documentation

## 2015-08-25 DIAGNOSIS — I129 Hypertensive chronic kidney disease with stage 1 through stage 4 chronic kidney disease, or unspecified chronic kidney disease: Secondary | ICD-10-CM | POA: Diagnosis not present

## 2015-08-25 DIAGNOSIS — R05 Cough: Secondary | ICD-10-CM | POA: Diagnosis not present

## 2015-08-25 LAB — CBC
HEMATOCRIT: 36.9 % — AB (ref 39.0–52.0)
HEMOGLOBIN: 12.1 g/dL — AB (ref 13.0–17.0)
MCH: 27.2 pg (ref 26.0–34.0)
MCHC: 32.8 g/dL (ref 30.0–36.0)
MCV: 82.9 fL (ref 78.0–100.0)
Platelets: 194 10*3/uL (ref 150–400)
RBC: 4.45 MIL/uL (ref 4.22–5.81)
RDW: 17.1 % — ABNORMAL HIGH (ref 11.5–15.5)
WBC: 7.7 10*3/uL (ref 4.0–10.5)

## 2015-08-25 LAB — CBG MONITORING, ED
GLUCOSE-CAPILLARY: 137 mg/dL — AB (ref 65–99)
Glucose-Capillary: 145 mg/dL — ABNORMAL HIGH (ref 65–99)

## 2015-08-25 LAB — COMPREHENSIVE METABOLIC PANEL
ALBUMIN: 3.6 g/dL (ref 3.5–5.0)
ALK PHOS: 106 U/L (ref 38–126)
ALT: 98 U/L — AB (ref 17–63)
ANION GAP: 11 (ref 5–15)
AST: 30 U/L (ref 15–41)
BILIRUBIN TOTAL: 0.8 mg/dL (ref 0.3–1.2)
BUN: 33 mg/dL — ABNORMAL HIGH (ref 6–20)
CALCIUM: 9.6 mg/dL (ref 8.9–10.3)
CO2: 21 mmol/L — ABNORMAL LOW (ref 22–32)
CREATININE: 2.16 mg/dL — AB (ref 0.61–1.24)
Chloride: 109 mmol/L (ref 101–111)
GFR calc Af Amer: 33 mL/min — ABNORMAL LOW (ref >60)
GFR calc non Af Amer: 28 mL/min — ABNORMAL LOW (ref >60)
Glucose, Bld: 140 mg/dL — ABNORMAL HIGH (ref 65–99)
Potassium: 4 mmol/L (ref 3.5–5.1)
Sodium: 141 mmol/L (ref 135–145)
TOTAL PROTEIN: 7.7 g/dL (ref 6.5–8.1)

## 2015-08-25 NOTE — Discharge Instructions (Signed)
Hypoglycemia Low blood sugar (hypoglycemia) means that the level of sugar in your blood is lower than it should be. Signs of low blood sugar include:  Getting sweaty.  Feeling hungry.  Feeling dizzy or weak.  Feeling sleepier than normal.  Feeling nervous.  Headaches.  Having a fast heartbeat. Low blood sugar can happen fast and can be an emergency. Your doctor can do tests to check your blood sugar level. You can have low blood sugar and not have diabetes. HOME CARE  Check your blood sugar as told by your doctor. If it is less than 70 mg/dl or as told by your doctor, take 1 of the following:  3 to 4 glucose tablets.   cup clear juice.   cup soda pop, not diet.  1 cup milk.  5 to 6 hard candies.  Recheck blood sugar after 15 minutes. Repeat until it is at the right level.  Eat a snack if it is more than 1 hour until the next meal.  Only take medicine as told by your doctor.  Do not skip meals. Eat on time.  Do not drink alcohol except with meals.  Check your blood glucose before driving.  Check your blood glucose before and after exercise.  Always carry treatment with you, such as glucose pills.  Always wear a medical alert bracelet if you have diabetes. GET HELP RIGHT AWAY IF:   Your blood glucose goes below 70 mg/dl or as told by your doctor, and you:  Are confused.  Are not able to swallow.  Pass out (faint).  You cannot treat yourself. You may need someone to help you.  You have low blood sugar problems often.  You have problems from your medicines.  You are not feeling better after 3 to 4 days.  You have vision changes. MAKE SURE YOU:   Understand these instructions.  Will watch this condition.  Will get help right away if you are not doing well or get worse.   This information is not intended to replace advice given to you by your health care provider. Make sure you discuss any questions you have with your health care provider.     Document Released: 01/02/2010 Document Revised: 10/29/2014 Document Reviewed: 06/14/2015 Elsevier Interactive Patient Education 2016 [employer]  

## 2015-08-25 NOTE — ED Notes (Addendum)
Per EMS - from [facility]. Pt presents w/ slurred speech, not acting right - nursing home called EMS. Pt CBG initially 37. Given glucagon by nursing facility. CBG 59 after glucagon (15-5920min later). D50 given - last CBG 184. Pt mental status improved, a&o to baseline at this time. Pt hx diabetes.

## 2015-08-25 NOTE — ED Notes (Signed)
Pt aware of need for urine specimen. 

## 2015-08-25 NOTE — ED Notes (Signed)
Myself and Clydie BraunKaren, RN undressed patient, placed in gown, on monitor, continuous pulse oximetry and blood pressure cuff; warm blankets given

## 2015-08-25 NOTE — ED Provider Notes (Signed)
CSN: [id number]     Arrival date & time 08/25/15  4098 History   First MD Initiated Contact with Patient 08/25/15 0815     Chief Complaint  Patient presents with  . Hypoglycemia     (Consider location/radiation/quality/duration/timing/severity/associated sxs/prior Treatment) HPI   Marc Wheeler is a 74 y.o. male with PMH significant for DM, HLD, HTN, CKD, depression, and diabetic retinopathy who presents with hypoglycemia.  Per EMS and nursing note, patient from [facility].  Patient had slurred speech and "not acting right" so nursing home called EMS.  Initial CBG 37.  Nursing facility gave glucagon, and CBG upon recheck was 59.  EMS gave D50, and last CBG 184.  Patient's mental status has improved since then, and he is alert and oriented to his baseline.  Patient denies recent changes in medications, and is unable to name medications he takes for diabetes.  Denies CP, HA, SOB, abdominal pain, fevers, urinary symptoms.  He has no complaints at this time.   Past Medical History  Diagnosis Date  . Diabetes mellitus without complication (HCC)   . Hyperlipidemia   . Hypertension   . Chronic kidney disease   . Edema   . Depression   . Diabetic retinopathy [facility])    Past Surgical History  Procedure Laterality Date  . Leg amputation below knee Bilateral   . Right retinal detachment repair  History reviewed. No pertinent family history. Social History  Substance Use Topics  . Smoking status: Former Games developer  . Smokeless tobacco: None  . Alcohol Use: No    Review of Systems  All other systems negative unless otherwise stated in HPI   Allergies  Review of patient's allergies indicates no known allergies.  Home Medications   Prior to Admission medications   Medication Sig Start Date End Date Taking? Authorizing Provider  acetaminophen (TYLENOL) 325 MG tablet Take 650 mg by mouth every 4 (four) hours as needed for pain.    Historical Provider, MD  Alum  Hydroxide-Mag Trisilicate (GAVISCON) 80-14.2 MG CHEW Chew 1-2 tablets by mouth every 4 (four) hours as needed (indigestion- pain). 01/02/15   Calvert Cantor, MD  amLODipine (NORVASC) 10 MG tablet Take 10 mg by mouth daily.    Historical Provider, MD  aspirin EC 81 MG tablet Take 81 mg by mouth daily.    Historical Provider, MD  atorvastatin (LIPITOR) 80 MG tablet Take 80 mg by mouth daily.    Historical Provider, MD  b complex-vitamin c-folic acid (NEPHRO-VITE) 0.8 MG TABS Take 0.8 mg by mouth at bedtime.    Historical Provider, MD  Calcium Carbonate-Vitamin D 600-200 MG-UNIT TABS Take 1 tablet by mouth daily.    Historical Provider, MD  cloNIDine (CATAPRES - DOSED IN MG/24 HR) 0.2 mg/24hr patch Place 1 patch onto the skin once a week.    Historical Provider, MD  diclofenac sodium (VOLTAREN) 1 % GEL Apply 2 g topically 4 (four) times daily as needed (shoulder pain).    Historical Provider, MD  docusate sodium (COLACE) 100 MG capsule Take 100 mg by mouth 2 (two) times daily.    Historical Provider, MD  fish oil-omega-3 fatty acids 1000 MG capsule Take 1 g by mouth daily.    Historical Provider, MD  furosemide (LASIX) 40 MG tablet Take 40 mg by mouth daily.    Historical Provider, MD  hydrALAZINE (APRESOLINE) 50 MG tablet Take 1 tablet (50 mg total) by mouth 4 (four) times daily. 09/22/14  Tiffany L Reed, DO  insulin aspart (NOVOLOG) 100 UNIT/ML injection Inject 30 Units into the skin 3 (three) times daily before meals. 04/21/13   Tiffany L Reed, DO  Insulin Glargine (TOUJEO SOLOSTAR) 300 UNIT/ML SOPN Inject 85 Units into the skin daily. 09/21/14   Tiffany L Reed, DO  linagliptin (TRADJENTA) 5 MG TABS tablet Take 5 mg by mouth daily.    Historical Provider, MD  metoprolol (LOPRESSOR) 100 MG tablet Take 100 mg by mouth 2 (two) times daily.    Historical Provider, MD  Polyethyl Glycol-Propyl Glycol (SYSTANE) 0.4-0.3 % SOLN Apply 1 drop to eye daily as needed. Both eyes    Historical Provider, MD   BP 145/68  mmHg  Pulse 75  Temp(Src) 97.5 F (36.4 C)  Resp 16  SpO2 100% Physical Exam  Constitutional: He is oriented to person, place, and time. He appears well-developed and well-nourished.  HENT:  Head: Normocephalic and atraumatic.  Mouth/Throat: Oropharynx is clear and moist.  Eyes: Conjunctivae are normal. Pupils are equal, round, and reactive to light.  Neck: Normal range of motion. Neck supple.  Cardiovascular: Normal rate, regular rhythm and normal heart sounds.   No murmur heard. Pulses:      Radial pulses are 2+ on the right side, and 2+ on the left side.  Pulmonary/Chest: Effort normal and breath sounds normal. No accessory muscle usage or stridor. No respiratory distress. He has no wheezes. He has no rhonchi. He has no rales.  Abdominal: Soft. Bowel sounds are normal. He exhibits no distension. There is no tenderness.  Musculoskeletal: Normal range of motion.  Lymphadenopathy:    He has no cervical adenopathy.  Neurological: He is alert and oriented to person, place, and time.  Speech clear without dysarthria.  Skin: Skin is warm and dry.  Bilateral BKA  Psychiatric: He has a normal mood and affect. His behavior is normal.    ED Course  Procedures (including critical care time) Labs Review Labs Reviewed  CBC - Abnormal; Notable for the following:    Hemoglobin 12.1 (*)    HCT 36.9 (*)    RDW 17.1 (*)    All other components within normal limits  COMPREHENSIVE METABOLIC PANEL - Abnormal; Notable for the following:    CO2 21 (*)    Glucose, Bld 140 (*)    BUN 33 (*)    Creatinine, Ser 2.16 (*)    ALT 98 (*)    GFR calc non Af Amer 28 (*)    GFR calc Af Amer 33 (*)    All other components within normal limits  CBG MONITORING, ED - Abnormal; Notable for the following:    Glucose-Capillary 145 (*)    All other components within normal limits  CBG MONITORING, ED - Abnormal; Notable for the following:    Glucose-Capillary 137 (*)    All other components within normal  limits  URINALYSIS, ROUTINE W REFLEX MICROSCOPIC (NOT [facility])  CBG MONITORING, ED  CBG MONITORING, ED    Imaging Review Dg Chest 2 View  08/25/2015  CLINICAL DATA:  Cough EXAM: CHEST  2 VIEW COMPARISON:  01/01/2015 FINDINGS: Elevated left hemidiaphragm with mild left lower lobe atelectasis, similar to the prior study. Right lung is clear. Negative for heart failure. Negative for pleural effusion. IMPRESSION: Left lower lobe atelectasis.  No superimposed acute abnormality. Electronically Signed   By: Marlan Palauharles  Clark M.D.   On: 08/25/2015 09:39   I have personally reviewed and evaluated these images and lab results  as part of my medical decision-making.   EKG Interpretation None      MDM   Final diagnoses:  Hypoglycemia    Patient presents with hypoglycemia.  VSS, NAD.  On exam, heart RRR, lungs CTAB, abdomen soft and benign.  A&O x 3.  CBG 145.  Will obtain UA, CMP, CBC, and CXR.  Patient reports he did not eat much last night because he doesn't like the food there too much.  CBC shows WBC 7.7 and hgb 12.1 (improved from previous). CXR shows LLL atelectasis, unchanged from previous study. CMP shows Cr 2.16, baseline.  Evaluation does not show pathology requring ongoing emergent intervention or admission. Pt is hemodynamically stable and mentating appropriately. Discussed findings/results and plan with patient/guardian, who agrees with plan. All questions answered. Return precautions discussed and outpatient follow up given.   Case has been discussed with and seen by Dr. Dalene Seltzer who agrees with the above plan for discharge.        Cheri Fowler, PA-C 08/25/15 1138  Alvira Monday, MD 08/28/15 1353

## 2015-08-29 NOTE — Progress Notes (Signed)
Patient ID: Marc Wheeler, male   DOB: 07/14/1941, 74 y.o.   MRN: [id number]   [facility]      No Known Allergies  Chief Complaint  Patient presents with  . Medical Management of Chronic Issues    HPI:  He is a long term resident of this facility being seen for the management of his chronic illnesses.  Overall his status is stable. He tells me that he is feeling good. He is not voicing any complaints or concerns at this time. There are no nursing concerns at this time. His cbg's are controlled.    Past Medical History  Diagnosis Date  . Diabetes mellitus without complication (HCC)   . Hyperlipidemia   . Hypertension   . Chronic kidney disease   . Edema   . Depression   . Diabetic retinopathy [facility](HCC)     Past Surgical History  Procedure Laterality Date  . Leg amputation below knee Bilateral   . Right retinal detachment repair  1 22 14     VITAL SIGNS BP 148/70 mmHg  Pulse 78  Ht 4\' 2"  (1.27 m)  Wt 220 lb (99.791 kg)  BMI 61.87 kg/m2  Patient's Medications  New Prescriptions   No medications on file  Previous Medications   ACETAMINOPHEN (TYLENOL) 325 MG TABLET    Take 650 mg by mouth every 4 (four) hours as needed for pain.   ALUM HYDROXIDE-MAG TRISILICATE (GAVISCON) 80-14.2 MG CHEW    Chew 1-2 tablets by mouth every 4 (four) hours as needed (indigestion- pain).   AMLODIPINE (NORVASC) 10 MG TABLET    Take 10 mg by mouth daily.   ASPIRIN EC 81 MG TABLET    Take 81 mg by mouth daily.   ATORVASTATIN (LIPITOR) 80 MG TABLET    Take 80 mg by mouth daily.   B COMPLEX-VITAMIN C-FOLIC ACID (NEPHRO-VITE) 0.8 MG TABS    Take 0.8 mg by mouth at bedtime.   CALCITRIOL (ROCALTROL) 0.25 MCG CAPSULE    Take 0.25 mcg by mouth 3 (three) times a week. On Monday; Wednesday; Friday   CLONIDINE (CATAPRES - DOSED IN MG/24 HR) 0.2 MG/24HR PATCH    Place 1 patch onto the skin once a week.   DICLOFENAC SODIUM (VOLTAREN) 1 % GEL    Apply 2 g topically 4 (four) times daily  as needed (shoulder pain).   DOCUSATE SODIUM (COLACE) 100 MG CAPSULE    Take 100 mg by mouth 2 (two) times daily.   FISH OIL-OMEGA-3 FATTY ACIDS 1000 MG CAPSULE    Take 1 g by mouth daily.   FUROSEMIDE (LASIX) 40 MG TABLET    Take 40 mg by mouth daily.   HYDRALAZINE (APRESOLINE) 50 MG TABLET    Take 1 tablet (50 mg total) by mouth 4 (four) times daily.   INSULIN ASPART (NOVOLOG) 100 UNIT/ML INJECTION    Inject 30 Units into the skin 3 (three) times daily before meals.   INSULIN GLARGINE (TOUJEO SOLOSTAR) 300 UNIT/ML SOPN    Inject 85 Units into the skin daily.   LINAGLIPTIN (TRADJENTA) 5 MG TABS TABLET    Take 5 mg by mouth daily.   METOPROLOL (LOPRESSOR) 100 MG TABLET    Take 100 mg by mouth 2 (two) times daily.   POLYETHYL GLYCOL-PROPYL GLYCOL (SYSTANE) 0.4-0.3 % SOLN    Apply 1 drop to eye daily as needed. Both eyes  Modified Medications   No medications on file  Discontinued Medications     SIGNIFICANT DIAGNOSTIC  EXAMS  01-05-15: EKG: SR with first degree AVB    LABS REVIEWED:   08-30-14: urine culture: hafnia alvei: cipro 09-01-14: wbc 6.7 ;hgb 10.8; hct 34; mcv 84.1; plt 183; glucose 128; bun 27; creat 1.97; k+4.7; na++143; phos 3.5; iron 60; tibc 237; ferritin 367 10-06-14: glucose 166; bun 41; creat 2.5; k+4.7; na++143 11-03-14; hgb a1c 8.2; chol 98; ldl 45; trig 153; hdl 22 11-29-14: wbc 6.3; hgb 10.7; hct 34.8; mcv 84.3; plt 199; glucose 146; bun 31; creat 2.29; k+4.0; na++140; liver normal albumin 3.7; phos 3.3  02-21-15: hgb a1c 6.6 02-25-15: wbc 6.2; hgb 10.7; hct 33.7; mcv 83; plt 200; glucose 128; bun 30;creat 1.96; k+4.3; na++141; liver normal albumin 3.7; phos 3.7; ferritin 202; iron 56; tibc 202   02-27-15: urine culture: multiple bacteria  05-25-15: hgb a1c 6.3 06-01-15: wbc 5.5; hgb 10.5; hct 34.1; mcv 81.8; plt 183; glucose 86; bun 34; creat 2.22; k+ 4.0; na++143; liver normal albumin 3.9; phos 3.5; ferritin 313; iron 71; tibc 126  pth 91 06-03-15: urine micro-albumin 106.0      Review of Systems Constitutional: Negative for malaise/fatigue.  Respiratory: Negative for cough and shortness of breath.   Cardiovascular: Negative for chest pain and palpitations.  Gastrointestinal: Negative for heartburn, abdominal pain and constipation.  Musculoskeletal: Negative for myalgias and joint pain.  Skin: Negative.   Neurological: Negative for weakness and headaches.  Psychiatric/Behavioral: The patient is not nervous/anxious.   Physical Exam Constitutional: He is oriented to person, place, and time. He appears well-developed and well-nourished. No distress.  Obese   Neck: Neck supple. No JVD present. No thyromegaly present.  Cardiovascular: Normal rate and regular rhythm.   Respiratory: Effort normal and breath sounds normal. No respiratory distress.  GI: Soft. Bowel sounds are normal. He exhibits no distension. There is no tenderness.  Musculoskeletal:  Bilateral bka;  Is able to move upper extremities   Neurological: He is alert and oriented to person, place, and time.  Skin: Skin is warm and dry. He is not diaphoretic.       ASSESSMENT/ PLAN:  1. Diabetes: will continue tuojeo 85 units daily; novolog 30 units with meals; tradjenta 5 mg daily; asa 81 mg daily; will monitor his hgb a1c is 6.3   2. Hypertensive renal disease: will continue norvasc 10 mg daily; lopressor 100 mg twice daily; clonidine 0.2 mg patch changed weekly;  hydralazine 50 mg four times daily; asa 81 mg daily   3. Dyslipidemia: will continue lipitor 80 mg daily; fish oil 1 gm daily his ldl is 45   4. 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 Diabetic retinopathy: is on systane eye drop to both eyes is followed by eye doctor   6. Osteoarthritis: is stable; will continue voltaren gel to left shoulder four times daily as  needed   7. Constipation: will continue colace twice daily   8. CAD: is stable no complaint of chest pain present; will continue asa 81 mg daily   9. Edema: will continue lasix 40 mg daily   Will check lipids     Synthia Innocent NP [facility]  Contact [phone] Monday through Friday 8am- 5pm  After hours call [phone]

## 2015-08-31 ENCOUNTER — Non-Acute Institutional Stay (SKILLED_NURSING_FACILITY): Payer: Medicare Other | Admitting: Adult Health

## 2015-08-31 DIAGNOSIS — E1122 Type 2 diabetes mellitus with diabetic chronic kidney disease: Secondary | ICD-10-CM

## 2015-08-31 DIAGNOSIS — N179 Acute kidney failure, unspecified: Secondary | ICD-10-CM | POA: Diagnosis not present

## 2015-08-31 DIAGNOSIS — Z794 Long term (current) use of insulin: Secondary | ICD-10-CM

## 2015-08-31 DIAGNOSIS — IMO0002 Reserved for concepts with insufficient information to code with codable children: Secondary | ICD-10-CM

## 2015-08-31 DIAGNOSIS — N183 Chronic kidney disease, stage 3 (moderate): Secondary | ICD-10-CM | POA: Diagnosis not present

## 2015-08-31 DIAGNOSIS — E1165 Type 2 diabetes mellitus with hyperglycemia: Secondary | ICD-10-CM

## 2015-09-02 DIAGNOSIS — E089 Diabetes mellitus due to underlying condition without complications: Secondary | ICD-10-CM | POA: Diagnosis not present

## 2015-09-02 DIAGNOSIS — N184 Chronic kidney disease, stage 4 (severe): Secondary | ICD-10-CM | POA: Diagnosis not present

## 2015-09-02 LAB — BASIC METABOLIC PANEL
BUN: 32 mg/dL — AB (ref 4–21)
Creatinine: 0.6 mg/dL (ref <=1.3)
Glucose: 102 mg/dL
Potassium: 4.4 mmol/L (ref 3.4–5.3)
Sodium: 142 mmol/L (ref 137–147)

## 2015-09-02 LAB — HEPATIC FUNCTION PANEL
ALT: 38 U/L (ref 10–40)
AST: 21 U/L (ref 14–40)
Alkaline Phosphatase: 100 U/L (ref 25–125)
BILIRUBIN, TOTAL: 0.6 mg/dL

## 2015-09-02 LAB — HEMOGLOBIN A1C: HEMOGLOBIN A1C: 6.3

## 2015-09-05 DIAGNOSIS — E119 Type 2 diabetes mellitus without complications: Secondary | ICD-10-CM | POA: Diagnosis not present

## 2015-09-05 DIAGNOSIS — N189 Chronic kidney disease, unspecified: Secondary | ICD-10-CM | POA: Diagnosis not present

## 2015-09-05 DIAGNOSIS — D6489 Other specified anemias: Secondary | ICD-10-CM | POA: Diagnosis not present

## 2015-09-05 LAB — CBC AND DIFFERENTIAL
HEMATOCRIT: 34 % — AB (ref 41–53)
HEMOGLOBIN: 10.7 g/dL — AB (ref 13.5–17.5)
PLATELETS: 197 10*3/uL (ref 150–399)
WBC: 6 10^3/mL

## 2015-09-27 ENCOUNTER — Encounter: Payer: Self-pay | Admitting: Internal Medicine

## 2015-09-27 ENCOUNTER — Non-Acute Institutional Stay (SKILLED_NURSING_FACILITY): Payer: Medicare Other | Admitting: Internal Medicine

## 2015-09-27 DIAGNOSIS — M159 Polyosteoarthritis, unspecified: Secondary | ICD-10-CM

## 2015-09-27 DIAGNOSIS — Z89511 Acquired absence of right leg below knee: Secondary | ICD-10-CM | POA: Diagnosis not present

## 2015-09-27 DIAGNOSIS — I10 Essential (primary) hypertension: Secondary | ICD-10-CM | POA: Diagnosis not present

## 2015-09-27 DIAGNOSIS — R600 Localized edema: Secondary | ICD-10-CM | POA: Diagnosis not present

## 2015-09-27 DIAGNOSIS — I25119 Atherosclerotic heart disease of native coronary artery with unspecified angina pectoris: Secondary | ICD-10-CM

## 2015-09-27 DIAGNOSIS — Z794 Long term (current) use of insulin: Secondary | ICD-10-CM | POA: Diagnosis not present

## 2015-09-27 DIAGNOSIS — E1122 Type 2 diabetes mellitus with diabetic chronic kidney disease: Secondary | ICD-10-CM | POA: Diagnosis not present

## 2015-09-27 DIAGNOSIS — M150 Primary generalized (osteo)arthritis: Secondary | ICD-10-CM | POA: Diagnosis not present

## 2015-09-27 DIAGNOSIS — N183 Chronic kidney disease, stage 3 unspecified: Secondary | ICD-10-CM

## 2015-09-27 DIAGNOSIS — E785 Hyperlipidemia, unspecified: Secondary | ICD-10-CM

## 2015-09-27 DIAGNOSIS — Z89512 Acquired absence of left leg below knee: Secondary | ICD-10-CM

## 2015-09-27 NOTE — Progress Notes (Signed)
Patient ID: Marc Wheeler, male   DOB: May 18, 1941, 74 y.o.   MRN: [id number]    DATE:09/27/15  Location:  [place] of Service: [id number])   Extended Emergency Contact Information Primary Emergency Contact: [place]  [place] Phone: [phone] Relation: Son Secondary Emergency Contact: [place] Phone: [phone] Relation: Other  Advanced Directive information   FULL CODE  Chief Complaint  Patient presents with  . Medical Management of Chronic Issues    HPI:  74 yo male long term resident seen today for f/u. He reports more use of salt and has noticed increased selling of R>L stump. (+)2 day hx palpitations. No CP/SOB. No other concerns. No falls. No nursing issues. eating and sleeping well  DM - stable with A1c 6.3% last month. He takes  tuojeo 85 units daily; novolog 30 units with meals; tradjenta 5 mg daily; asa 81 mg daily. He has retinopathy and is followed by eye specialist. He uses systane eye gtts. He also has CKD stage 3  HTN/CAD - BP stable on norvasc 10 mg daily; lopressor 100 mg twice daily; clonidine 0.2 mg patch changed weekly;  hydralazine 50 mg four times daily; asa 81 mg daily. No c/o CP and has not req'd SL NTG  Hyperlipidemia - stable on lipitor 80 mg daily; fish oil 1 gm daily . 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 He takes calcitriol 0.25 mcg three times weekly; 1200 cc fluid restriction. followed by nephrology. Cr 2.22   Osteoarthritis - stable on voltaren gel to left shoulder four times daily as needed   Constipation - BMs regular on colace twice daily    Edema - takes lasix 40 mg daily. He is noncompliant with salt restriction  Hx anemia - Hgb 10.7. Due to CKD Past Medical History  Diagnosis Date  . Diabetes mellitus without complication ([place])   . Hyperlipidemia   . Hypertension   . Chronic kidney disease   . Edema     . Depression   . Diabetic retinopathy [facility])     Past Surgical History  Procedure Laterality Date  . Leg amputation below knee Bilateral   . Right retinal detachment repair  1 22 14     Patient Care Team: Gildardo Cranker, DO as PCP - General (Internal Medicine) Gerlene Fee, NP as Nurse Practitioner (Nurse Practitioner)  Social History   Social History  . Marital Status: Widowed    Spouse Name: N/A  . Number of Children: N/A  . Years of Education: N/A   Occupational History  . Not on file.   Social History Main Topics  . Smoking status: Former Research scientist (life sciences)  . Smokeless tobacco: Not on file  . Alcohol Use: No  . Drug Use: No  . Sexual Activity: No   Other Topics Concern  . Not on file   Social History Narrative     reports that he has quit smoking. He does not have any smokeless tobacco history on file. He reports that he does not drink alcohol or use illicit drugs.  Immunization History  Administered Date(s) Administered  . Influenza-Unspecified 08/04/2014, 07/15/2015  . PPD Test 05/18/2011, 05/25/2011    No Known Allergies  Medications: Patient's Medications  New Prescriptions   No medications on file  Previous Medications   ACETAMINOPHEN (TYLENOL) 325 MG TABLET    Take 650 mg by mouth every 4 (four) hours  as needed for pain.   ALUM HYDROXIDE-MAG TRISILICATE (GAVISCON) 69-79.4 MG CHEW    Chew 1-2 tablets by mouth every 4 (four) hours as needed (indigestion- pain).   AMLODIPINE (NORVASC) 10 MG TABLET    Take 10 mg by mouth daily.   ASPIRIN EC 81 MG TABLET    Take 81 mg by mouth daily.   ATORVASTATIN (LIPITOR) 80 MG TABLET    Take 80 mg by mouth daily.   B COMPLEX-VITAMIN C-FOLIC ACID (NEPHRO-VITE) 0.8 MG TABS    Take 0.8 mg by mouth at bedtime.   CALCITRIOL (ROCALTROL) 0.25 MCG CAPSULE    Take 0.25 mcg by mouth 3 (three) times a week. On Monday; Wednesday; Friday   CLONIDINE (CATAPRES - DOSED IN MG/24 HR) 0.2 MG/24HR PATCH    Place 1 patch onto the skin once a  week.   DICLOFENAC SODIUM (VOLTAREN) 1 % GEL    Apply 2 g topically 4 (four) times daily as needed (shoulder pain).   DOCUSATE SODIUM (COLACE) 100 MG CAPSULE    Take 100 mg by mouth 2 (two) times daily.   FISH OIL-OMEGA-3 FATTY ACIDS 1000 MG CAPSULE    Take 1 g by mouth daily.   FUROSEMIDE (LASIX) 40 MG TABLET    Take 40 mg by mouth daily.   HYDRALAZINE (APRESOLINE) 50 MG TABLET    Take 1 tablet (50 mg total) by mouth 4 (four) times daily.   INSULIN ASPART (NOVOLOG) 100 UNIT/ML INJECTION    Inject 30 Units into the skin 3 (three) times daily before meals.   INSULIN GLARGINE (TOUJEO SOLOSTAR) 300 UNIT/ML SOPN    Inject 85 Units into the skin daily.   LINAGLIPTIN (TRADJENTA) 5 MG TABS TABLET    Take 5 mg by mouth daily.   METOPROLOL (LOPRESSOR) 100 MG TABLET    Take 100 mg by mouth 2 (two) times daily.   POLYETHYL GLYCOL-PROPYL GLYCOL (SYSTANE) 0.4-0.3 % SOLN    Apply 1 drop to eye daily as needed. Both eyes  Modified Medications   No medications on file  Discontinued Medications   No medications on file    Review of Systems  Cardiovascular: Positive for palpitations.       Stump swelling  All other systems reviewed and are negative.   Filed Vitals:   09/27/15 1445  BP: 144/67  Pulse: 77  Temp: 97.6 F (36.4 C)  Weight: 260 lb (117.935 kg)  SpO2: 95%   Body mass index is 73.12 kg/(m^2).  Physical Exam  Constitutional: He is oriented to person, place, and time. He appears well-developed and well-nourished.  Sitting in w/c in NAD  HENT:  Mouth/Throat: Oropharynx is clear and moist.  Eyes: Pupils are equal, round, and reactive to light. No scleral icterus.  Neck: Neck supple. Carotid bruit is not present. No thyromegaly present.  Cardiovascular: Normal rate, regular rhythm and intact distal pulses.  Exam reveals no gallop and no friction rub.   Murmur (1/6 SEM) heard. R>L +1 pitting edema of stump  Pulmonary/Chest: Effort normal and breath sounds normal. He has no wheezes. He  has no rales. He exhibits no tenderness.  Abdominal: Soft. Bowel sounds are normal. He exhibits no distension, no abdominal bruit, no pulsatile midline mass and no mass. There is no tenderness. There is no rebound and no guarding.  Musculoskeletal:  B/l BKA  Lymphadenopathy:    He has no cervical adenopathy.  Neurological: He is alert and oriented to person, place, and time.  Skin: Skin is  warm and dry. No rash noted.  Psychiatric: He has a normal mood and affect. His behavior is normal.     Labs reviewed: Admission on 08/25/2015, Discharged on 08/25/2015  Component Date Value Ref Range Status  . Glucose-Capillary 08/25/2015 145* 65 - 99 mg/dL Final  . Glucose-Capillary 08/25/2015 137* 65 - 99 mg/dL Final  . WBC 08/25/2015 7.7  4.0 - 10.5 K/uL Final  . RBC 08/25/2015 4.45  4.22 - 5.81 MIL/uL Final  . Hemoglobin 08/25/2015 12.1* 13.0 - 17.0 g/dL Final  . HCT 08/25/2015 36.9* 39.0 - 52.0 % Final  . MCV 08/25/2015 82.9  78.0 - 100.0 fL Final  . MCH 08/25/2015 27.2  26.0 - 34.0 pg Final  . MCHC 08/25/2015 32.8  30.0 - 36.0 g/dL Final  . RDW 08/25/2015 17.1* 11.5 - 15.5 % Final  . Platelets 08/25/2015 194  150 - 400 K/uL Final  . Sodium 08/25/2015 141  135 - 145 mmol/L Final  . Potassium 08/25/2015 4.0  3.5 - 5.1 mmol/L Final  . Chloride 08/25/2015 109  101 - 111 mmol/L Final  . CO2 08/25/2015 21* 22 - 32 mmol/L Final  . Glucose, Bld 08/25/2015 140* 65 - 99 mg/dL Final  . BUN 08/25/2015 33* 6 - 20 mg/dL Final  . Creatinine, Ser 08/25/2015 2.16* 0.61 - 1.24 mg/dL Final  . Calcium 08/25/2015 9.6  8.9 - 10.3 mg/dL Final  . Total Protein 08/25/2015 7.7  6.5 - 8.1 g/dL Final  . Albumin 08/25/2015 3.6  3.5 - 5.0 g/dL Final  . AST 08/25/2015 30  15 - 41 U/L Final  . ALT 08/25/2015 98* 17 - 63 U/L Final  . Alkaline Phosphatase 08/25/2015 106  38 - 126 U/L Final  . Total Bilirubin 08/25/2015 0.8  0.3 - 1.2 mg/dL Final  . GFR calc non Af Amer 08/25/2015 28* >60 mL/min Final  . GFR calc Af  Amer 08/25/2015 33* >60 mL/min Final   Comment: (NOTE) The eGFR has been calculated using the CKD EPI equation. This calculation has not been validated in all clinical situations. eGFR's persistently <60 mL/min signify possible Chronic Kidney Disease.   . Anion gap 08/25/2015 11  5 - 15 Final    No results found.   Assessment/Plan   ICD-9-CM ICD-10-CM   1. Localized edema 782.3 R60.0   2. Essential hypertension, benign 401.1 I10   3. Coronary artery disease involving native coronary artery of native heart with angina pectoris (HCC) 414.01 I25.119    413.9    4. Type 2 diabetes mellitus with stage 3 chronic kidney disease, with long-term current use of insulin (HCC) 250.40 E11.22    585.3 N18.3    V58.67 Z79.4   5. S/P bilateral BKA (below knee amputation) ([place]) V49.75 Z89.512     Z89.511   6. Hyperlipidemia LDL goal <100 272.4 E78.5   7. Primary osteoarthritis involving multiple joints 715.09 M15.0     Encourage to reduce salt intake to 2 grams daily  cont current meds as ordered  PT/OT/ST as ordered  F/u with specialists as indicated  Will follow  Kenyada Hy S. Perlie Gold  [facility] and [facility] [address] [place], [place] [address] [phone] Cell (Monday-Friday 8 AM - 5 PM) [phone] After 5 PM and follow prompts

## 2015-10-03 ENCOUNTER — Non-Acute Institutional Stay (SKILLED_NURSING_FACILITY): Payer: Medicare Other | Admitting: Adult Health

## 2015-10-03 DIAGNOSIS — Z89512 Acquired absence of left leg below knee: Secondary | ICD-10-CM

## 2015-10-03 DIAGNOSIS — Z89511 Acquired absence of right leg below knee: Secondary | ICD-10-CM | POA: Diagnosis not present

## 2015-10-03 DIAGNOSIS — E785 Hyperlipidemia, unspecified: Secondary | ICD-10-CM | POA: Diagnosis not present

## 2015-10-03 DIAGNOSIS — R601 Generalized edema: Secondary | ICD-10-CM

## 2015-10-03 DIAGNOSIS — E1365 Other specified diabetes mellitus with hyperglycemia: Secondary | ICD-10-CM | POA: Diagnosis not present

## 2015-10-03 DIAGNOSIS — N183 Chronic kidney disease, stage 3 unspecified: Secondary | ICD-10-CM

## 2015-10-03 DIAGNOSIS — IMO0002 Reserved for concepts with insufficient information to code with codable children: Secondary | ICD-10-CM

## 2015-10-03 DIAGNOSIS — E1322 Other specified diabetes mellitus with diabetic chronic kidney disease: Secondary | ICD-10-CM | POA: Diagnosis not present

## 2015-10-03 DIAGNOSIS — L03115 Cellulitis of right lower limb: Secondary | ICD-10-CM

## 2015-10-03 DIAGNOSIS — E1122 Type 2 diabetes mellitus with diabetic chronic kidney disease: Secondary | ICD-10-CM | POA: Diagnosis not present

## 2015-10-03 DIAGNOSIS — I129 Hypertensive chronic kidney disease with stage 1 through stage 4 chronic kidney disease, or unspecified chronic kidney disease: Secondary | ICD-10-CM | POA: Diagnosis not present

## 2015-10-17 ENCOUNTER — Encounter: Payer: Self-pay | Admitting: Adult Health

## 2015-10-17 MED ORDER — INSULIN GLARGINE 300 UNIT/ML ~~LOC~~ SOPN: 70.0000 [IU] | PEN_INJECTOR | Freq: Every day | SUBCUTANEOUS | Status: DC

## 2015-10-17 MED ORDER — INSULIN ASPART 100 UNIT/ML ~~LOC~~ SOLN: 10.0000 [IU] | Freq: Three times a day (TID) | SUBCUTANEOUS | Status: DC

## 2015-10-17 NOTE — Progress Notes (Signed)
Patient ID: Marc Wheeler, male   DOB: 02/15/1941, 74 y.o.   MRN: [id number]    [facility]      No Known Allergies  Chief Complaint  Patient presents with  . Acute Visit    diabetes     HPI:  He has been having low cbg readings; did have to go to the ED due to his low reading one time. The staff reports that his reading have greatly improved over the past several weeks. He states that overall he is feeling pretty good.    Past Medical History  Diagnosis Date  . Diabetes mellitus without complication (HCC)   . Hyperlipidemia   . Hypertension   . Chronic kidney disease   . Edema   . Depression   . Diabetic retinopathy [facility](HCC)     Past Surgical History  Procedure Laterality Date  . Leg amputation below knee Bilateral   . Right retinal detachment repair  1 22 14     VITAL SIGNS BP 149/90 mmHg  Pulse 75  Ht 4\' 2"  (1.27 m)  Wt 220 lb (99.791 kg)  BMI 61.87 kg/m2  Patient's Medications  New Prescriptions   No medications on file  Previous Medications   ACETAMINOPHEN (TYLENOL) 325 MG TABLET    Take 650 mg by mouth every 4 (four) hours as needed for pain.   ALUM HYDROXIDE-MAG TRISILICATE (GAVISCON) 80-14.2 MG CHEW    Chew 1-2 tablets by mouth every 4 (four) hours as needed (indigestion- pain).   AMLODIPINE (NORVASC) 10 MG TABLET    Take 10 mg by mouth daily.   ASPIRIN EC 81 MG TABLET    Take 81 mg by mouth daily.   ATORVASTATIN (LIPITOR) 80 MG TABLET    Take 80 mg by mouth daily.   B COMPLEX-VITAMIN C-FOLIC ACID (NEPHRO-VITE) 0.8 MG TABS    Take 0.8 mg by mouth at bedtime.   CALCITRIOL (ROCALTROL) 0.25 MCG CAPSULE    Take 0.25 mcg by mouth 3 (three) times a week. On Monday; Wednesday; Friday   CLONIDINE (CATAPRES - DOSED IN MG/24 HR) 0.2 MG/24HR PATCH    Place 1 patch onto the skin once a week.   DICLOFENAC SODIUM (VOLTAREN) 1 % GEL    Apply 2 g topically 4 (four) times daily as needed (shoulder pain).   DOCUSATE SODIUM (COLACE) 100 MG CAPSULE    Take 100 mg by  mouth 2 (two) times daily.   FISH OIL-OMEGA-3 FATTY ACIDS 1000 MG CAPSULE    Take 1 g by mouth daily.   FUROSEMIDE (LASIX) 40 MG TABLET    Take 40 mg by mouth daily.   HYDRALAZINE (APRESOLINE) 50 MG TABLET    Take 1 tablet (50 mg total) by mouth 4 (four) times daily.   INSULIN ASPART (NOVOLOG) 100 UNIT/ML INJECTION    Inject 30 Units into the skin 3 (three) times daily before meals. For cbg >=150   INSULIN GLARGINE (TOUJEO SOLOSTAR) 300 UNIT/ML SOPN    Inject 85 Units into the skin daily.   LINAGLIPTIN (TRADJENTA) 5 MG TABS TABLET    Take 5 mg by mouth daily.   METOPROLOL (LOPRESSOR) 100 MG TABLET    Take 100 mg by mouth 2 (two) times daily.   POLYETHYL GLYCOL-PROPYL GLYCOL (SYSTANE) 0.4-0.3 % SOLN    Apply 1 drop to eye daily as needed. Both eyes  Modified Medications   No medications on file  Discontinued Medications   No medications on file     SIGNIFICANT DIAGNOSTIC  EXAMS   01-05-15: EKG: SR with first degree AVB   08-25-15: chest x-ray: Left lower lobe atelectasis.  No superimposed acute abnormality.    LABS REVIEWED:   11-03-14; hgb a1c 8.2; chol 98; ldl 45; trig 153; hdl 22 11-29-14: wbc 6.3; hgb 10.7; hct 34.8; mcv 84.3; plt 199; glucose 146; bun 31; creat 2.29; k+4.0; na++140; liver normal albumin 3.7; phos 3.3  02-21-15: hgb a1c 6.6 02-25-15: wbc 6.2; hgb 10.7; hct 33.7; mcv 83; plt 200; glucose 128; bun 30;creat 1.96; k+4.3; na++141; liver normal albumin 3.7; phos 3.7; ferritin 202; iron 56; tibc 202   02-27-15: urine culture: multiple bacteria  05-25-15: hgb a1c 6.3 06-01-15: wbc 5.5; hgb 10.5; hct 34.1; mcv 81.8; plt 183; glucose 86; bun 34; creat 2.22; k+ 4.0; na++143; liver normal albumin 3.9; phos 3.5; ferritin 313; iron 71; tibc 126  pth 91 06-03-15: urine micro-albumin 106.0  08-25-15: wbc 7.7; hgb 12.1; hct 36.9; mcv 82.9; plt 194; glucose 140; bun 33; creat 2.16; k+ 4.0; na++1141; alt 98; albumin 3.0     Review of Systems Constitutional: Negative for malaise/fatigue.    Respiratory: Negative for cough and shortness of breath.   Cardiovascular: Negative for chest pain and palpitations.  Gastrointestinal: Negative for heartburn, abdominal pain and constipation.  Musculoskeletal: Negative for myalgias and joint pain.  Skin: Negative.   Neurological: Negative for weakness and headaches.  Psychiatric/Behavioral: The patient is not nervous/anxious.   Physical Exam Constitutional: He is oriented to person, place, and time. He appears well-developed and well-nourished. No distress.  Obese   Neck: Neck supple. No JVD present. No thyromegaly present.  Cardiovascular: Normal rate and regular rhythm.   Respiratory: Effort normal and breath sounds normal. No respiratory distress.  GI: Soft. Bowel sounds are normal. He exhibits no distension. There is no tenderness.  Musculoskeletal:  Bilateral bka;  Is able to move upper extremities   Neurological: He is alert and oriented to person, place, and time.  Skin: Skin is warm and dry. He is not diaphoretic.       ASSESSMENT/ PLAN:  1. Diabetes: will continue  tradjenta 5 mg daily; asa 81 mg daily;  Will lower his tuojeo to 70 units daily and will change his humalog to 10 units with meals for cbg >=150 will monitor his hgb a1c is 6.3   Will check hgb a1c       Synthia Innocent NP [facility] Adult [facility]  Contact [phone] Monday through Friday 8am- 5pm  After hours call [phone]

## 2015-10-17 NOTE — Progress Notes (Signed)
Patient ID: Marc Wheeler, male   DOB: [birth year], 74 y.o.   MRN: [id number]    [facility]      No Known Allergies  Chief Complaint  Patient presents with  . Annual Exam    HPI:  He is a long term resident of this facility being seen for his annual exam. He has remained stable this past year. He has not been hospitalized. He continues to be followed by nephrology for his renal disease. He is not a good candidate for a colonoscopy. He is not voicing any complaints at this time. There are no nursing concerns at this time.    Past Medical History  Diagnosis Date  . Diabetes mellitus without complication (HCC)   . Hyperlipidemia   . Hypertension   . Chronic kidney disease   . Edema   . Depression   . Diabetic retinopathy [facility])     Past Surgical History  Procedure Laterality Date  . Leg amputation below knee Bilateral   . Right retinal detachment repair  Social History   Social History  . Marital Status: Widowed    Spouse Name: N/A  . Number of Children: N/A  . Years of Education: N/A   Occupational History  . Not on file.   Social History Main Topics  . Smoking status: Former Games developer  . Smokeless tobacco: Not on file  . Alcohol Use: No  . Drug Use: No  . Sexual Activity: No   Other Topics Concern  . Not on file   Social History Narrative      VITAL SIGNS BP 132/66 mmHg  Pulse 74  Ht  (1.27 m)  Wt 218 lb (98.884 kg)  BMI 61.31 kg/m2  Patient's Medications  New Prescriptions   No medications on file  Previous Medications   ACETAMINOPHEN (TYLENOL) 325 MG TABLET    Take 650 mg by mouth every 4 (four) hours as needed for pain.   ALUM HYDROXIDE-MAG TRISILICATE (GAVISCON) 80-14.2 MG CHEW    Chew 1-2 tablets by mouth every 4 (four) hours as needed (indigestion- pain).   AMLODIPINE (NORVASC) 10 MG TABLET    Take 10 mg by mouth daily.   ASPIRIN EC 81 MG TABLET    Take 81 mg by mouth daily.   ATORVASTATIN (LIPITOR) 80 MG TABLET     Take 80 mg by mouth daily.   B COMPLEX-VITAMIN C-FOLIC ACID (NEPHRO-VITE) 0.8 MG TABS    Take 0.8 mg by mouth at bedtime.   CALCITRIOL (ROCALTROL) 0.25 MCG CAPSULE    Take 0.25 mcg by mouth 3 (three) times a week. On Monday; Wednesday; Friday   CLONIDINE (CATAPRES - DOSED IN MG/24 HR) 0.2 MG/24HR PATCH    Place 1 patch onto the skin once a week.   DICLOFENAC SODIUM (VOLTAREN) 1 % GEL    Apply 2 g topically 4 (four) times daily as needed (shoulder pain).   DOCUSATE SODIUM (COLACE) 100 MG CAPSULE    Take 100 mg by mouth 2 (two) times daily.   FISH OIL-OMEGA-3 FATTY ACIDS 1000 MG CAPSULE    Take 1 g by mouth daily.   FUROSEMIDE (LASIX) 40 MG TABLET    Take 40 mg by mouth daily.   HYDRALAZINE (APRESOLINE) 50 MG TABLET    Take 1 tablet (50 mg total) by mouth 4 (four) times daily.   INSULIN ASPART (NOVOLOG) 100 UNIT/ML INJECTION    Inject 30 Units into the skin 3 (three) times  daily before meals. For cbg >=150   INSULIN GLARGINE (TOUJEO SOLOSTAR) 300 UNIT/ML SOPN    Inject 85 Units into the skin daily.   LINAGLIPTIN (TRADJENTA) 5 MG TABS TABLET    Take 5 mg by mouth daily.   METOPROLOL (LOPRESSOR) 100 MG TABLET    Take 100 mg by mouth 2 (two) times daily.   POLYETHYL GLYCOL-PROPYL GLYCOL (SYSTANE) 0.4-0.3 % SOLN    Apply 1 drop to eye daily as needed. Both eyes  Modified Medications   No medications on file  Discontinued Medications   No medications on file     SIGNIFICANT DIAGNOSTIC EXAMS   01-05-15: EKG: SR with first degree AVB    LABS REVIEWED:   11-03-14; hgb a1c 8.2; chol 98; ldl 45; trig 153; hdl 22 11-29-14: wbc 6.3; hgb 10.7; hct 34.8; mcv 84.3; plt 199; glucose 146; bun 31; creat 2.29; k+4.0; na++140; liver normal albumin 3.7; phos 3.3  02-21-15: hgb a1c 6.6 02-25-15: wbc 6.2; hgb 10.7; hct 33.7; mcv 83; plt 200; glucose 128; bun 30;creat 1.96; k+4.3; na++141; liver normal albumin 3.7; phos 3.7; ferritin 202; iron 56; tibc 202   02-27-15: urine culture: multiple bacteria  05-25-15: hgb a1c  6.3 06-01-15: wbc 5.5; hgb 10.5; hct 34.1; mcv 81.8; plt 183; glucose 86; bun 34; creat 2.22; k+ 4.0; na++143; liver normal albumin 3.9; phos 3.5; ferritin 313; iron 71; tibc 126  pth 91 06-03-15: urine micro-albumin 106.0     Review of Systems Constitutional: Negative for malaise/fatigue.  Respiratory: Negative for cough and shortness of breath.   Cardiovascular: Negative for chest pain and palpitations.  Gastrointestinal: Negative for heartburn, abdominal pain and constipation.  Musculoskeletal: Negative for myalgias and joint pain.  Skin: Negative.   Neurological: Negative for weakness and headaches.  Psychiatric/Behavioral: The patient is not nervous/anxious.   Physical Exam Constitutional: He is oriented to person, place, and time. He appears well-developed and well-nourished. No distress.  Obese   Neck: Neck supple. No JVD present. No thyromegaly present.  Cardiovascular: Normal rate and regular rhythm.   Respiratory: Effort normal and breath sounds normal. No respiratory distress.  GI: Soft. Bowel sounds are normal. He exhibits no distension. There is no tenderness.  Musculoskeletal:  Bilateral bka;  Is able to move upper extremities   Neurological: He is alert and oriented to person, place, and time.  Skin: Skin is warm and dry. He is not diaphoretic.       ASSESSMENT/ PLAN:  1. Diabetes: will continue tuojeo 85 units daily; novolog 30 units with meals; tradjenta 5 mg daily; asa 81 mg daily; will monitor his hgb a1c is 6.3   2. Hypertensive renal disease: will continue norvasc 10 mg daily; lopressor 100 mg twice daily; clonidine 0.2 mg patch changed weekly;  hydralazine 50 mg four times daily; asa 81 mg daily   3. Dyslipidemia: will continue lipitor 80 mg daily; fish oil 1 gm daily his ldl is 45   4. Renal disease stage III secondary to diabetes type II: is without change will continue calcitriol 0.25 mcg three times weekly is on 1200 cc fluid restriction  Is followed  by nephrology  His creat is 2.22   5. Diabetic retinopathy: is on systane eye drop to both eyes is followed by eye doctor   6. Osteoarthritis: is stable; will continue voltaren gel to left shoulder four times daily as needed   7. Constipation: will continue colace twice daily   8. CAD: is stable no complaint of chest  pain present; will continue asa 81 mg daily   9. Edema: will continue lasix 40 mg daily   Will check lipids and PSA Will place him on eye dr list    Time spent with patient  40  minutes >50% time spent counseling; reviewing medical record; tests; labs; and developing future plan of care      Synthia Innocent NP [facility] Adult Medicine  Contact [phone] Monday through Friday 8am- 5pm  After hours call [phone]

## 2015-10-21 ENCOUNTER — Encounter: Payer: Self-pay | Admitting: Adult Health

## 2015-10-21 DIAGNOSIS — L03115 Cellulitis of right lower limb: Secondary | ICD-10-CM | POA: Insufficient documentation

## 2015-10-21 NOTE — Progress Notes (Signed)
Patient ID: Marc LieuJimmy Wheeler, male   DOB: 03/15/1941, 74 y.o.   MRN: [id number]   [facility]      No Known Allergies  Chief Complaint  Patient presents with  . Medical Management of Chronic Issues    HPI:  He is a long term resident of this facility being seen for the management of his chronic illnesses. His right stump is red hot and inflamed. There are no open lesions present. There are no reports of fever present. He states that he is feeling ok.     Past Medical History  Diagnosis Date  . Diabetes mellitus without complication (HCC)   . Hyperlipidemia   . Hypertension   . Chronic kidney disease   . Edema   . Depression   . Diabetic retinopathy [facility](HCC)     Past Surgical History  Procedure Laterality Date  . Leg amputation below knee Bilateral   . Right retinal detachment repair  1 22 14     VITAL SIGNS BP 144/67 mmHg  Pulse 86  Ht 4\' 2"  (1.27 m)  Wt 260 lb (117.935 kg)  BMI 73.12 kg/m2  SpO2 95%  Patient's Medications  New Prescriptions   No medications on file  Previous Medications   ACETAMINOPHEN (TYLENOL) 325 MG TABLET    Take 650 mg by mouth every 4 (four) hours as needed for pain.   ALUM HYDROXIDE-MAG TRISILICATE (GAVISCON) 80-14.2 MG CHEW    Chew 1-2 tablets by mouth every 4 (four) hours as needed (indigestion- pain).   AMLODIPINE (NORVASC) 10 MG TABLET    Take 10 mg by mouth daily.   ASPIRIN EC 81 MG TABLET    Take 81 mg by mouth daily.   ATORVASTATIN (LIPITOR) 80 MG TABLET    Take 80 mg by mouth daily.   B COMPLEX-VITAMIN C-FOLIC ACID (NEPHRO-VITE) 0.8 MG TABS    Take 0.8 mg by mouth at bedtime.   CALCITRIOL (ROCALTROL) 0.25 MCG CAPSULE    Take 0.25 mcg by mouth 3 (three) times a week. On Monday; Wednesday; Friday   CLONIDINE (CATAPRES - DOSED IN MG/24 HR) 0.2 MG/24HR PATCH    Place 1 patch onto the skin once a week.   DOCUSATE SODIUM (COLACE) 100 MG CAPSULE    Take 100 mg by mouth 2 (two) times daily.   FISH OIL-OMEGA-3 FATTY ACIDS 1000 MG  CAPSULE    Take 1 g by mouth daily.   FUROSEMIDE (LASIX) 40 MG TABLET    Take 40 mg by mouth daily.   HYDRALAZINE (APRESOLINE) 50 MG TABLET    Take 1 tablet (50 mg total) by mouth 4 (four) times daily.   INSULIN ASPART (NOVOLOG) 100 UNIT/ML INJECTION    Inject 10 Units into the skin 3 (three) times daily before meals. For cbg >=150   INSULIN GLARGINE (TOUJEO SOLOSTAR) 300 UNIT/ML SOPN    Inject 70 Units into the skin daily.   LINAGLIPTIN (TRADJENTA) 5 MG TABS TABLET    Take 5 mg by mouth daily.   METOPROLOL (LOPRESSOR) 100 MG TABLET    Take 100 mg by mouth 2 (two) times daily.   POLYETHYL GLYCOL-PROPYL GLYCOL (SYSTANE) 0.4-0.3 % SOLN    Apply 1 drop to eye daily as needed. Both eyes  Modified Medications   No medications on file  Discontinued Medications   No medications on file     SIGNIFICANT DIAGNOSTIC EXAMS   01-05-15: EKG: SR with first degree AVB   08-25-15: chest x-ray: Left lower lobe atelectasis.  No superimposed acute abnormality.    LABS REVIEWED:   11-03-14; hgb a1c 8.2; chol 98; ldl 45; trig 153; hdl 22 11-29-14: wbc 6.3; hgb 10.7; hct 34.8; mcv 84.3; plt 199; glucose 146; bun 31; creat 2.29; k+4.0; na++140; liver normal albumin 3.7; phos 3.3  02-21-15: hgb a1c 6.6 02-25-15: wbc 6.2; hgb 10.7; hct 33.7; mcv 83; plt 200; glucose 128; bun 30;creat 1.96; k+4.3; na++141; liver normal albumin 3.7; phos 3.7; ferritin 202; iron 56; tibc 202   02-27-15: urine culture: multiple bacteria  05-25-15: hgb a1c 6.3 06-01-15: wbc 5.5; hgb 10.5; hct 34.1; mcv 81.8; plt 183; glucose 86; bun 34; creat 2.22; k+ 4.0; na++143; liver normal albumin 3.9; phos 3.5; ferritin 313; iron 71; tibc 126  pth 91 06-03-15: urine micro-albumin 106.0  08-25-15: wbc 7.7; hgb 12.1; hct 36.9; mcv 82.9; plt 194; glucose 140; bun 33; creat 2.16; k+ 4.0; na++1141; alt 98; albumin 3.0  09-02-15: glucose 102; bun 32; creat 2.10; k+ 4.4; na+=142; liver normal albumin 3.4;ferritin 411; PTH 136; ca 9.2; hgb a1c 6.3     Review  of Systems Constitutional: Negative for malaise/fatigue.  Respiratory: Negative for cough and shortness of breath.   Cardiovascular: Negative for chest pain and palpitations.  Gastrointestinal: Negative for heartburn, abdominal pain and constipation.  Musculoskeletal: Negative for myalgias and joint pain.  Skin: right stump infected    Neurological: Negative for weakness and headaches.  Psychiatric/Behavioral: The patient is not nervous/anxious.    Physical Exam Constitutional: He is oriented to person, place, and time. He appears well-developed and well-nourished. No distress.  Obese   Neck: Neck supple. No JVD present. No thyromegaly present.  Cardiovascular: Normal rate and regular rhythm.   Respiratory: Effort normal and breath sounds normal. No respiratory distress.  GI: Soft. Bowel sounds are normal. He exhibits no distension. There is no tenderness.  Musculoskeletal:  Bilateral bka;  Is able to move upper extremities   Neurological: He is alert and oriented to person, place, and time.  Skin: Skin is warm and dry. He is not diaphoretic.  Right stump inflamed with extremity dry crusty skin. Denies pain and tenderness     ASSESSMENT/ PLAN:   1. Diabetes: will continue tuojeo 70 units daily; novolog 10 units with meals for cbg >=150; tradjenta 5 mg daily; asa 81 mg daily; will monitor his hgb a1c is 6.3   2. Hypertensive renal disease: will continue norvasc 10 mg daily; lopressor 100 mg twice daily; clonidine 0.2 mg patch changed weekly;  hydralazine 50 mg four times daily; asa 81 mg daily   3. Dyslipidemia: will continue lipitor 80 mg daily; fish oil 1 gm daily his ldl is 45   4. 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 Diabetic retinopathy: is on systane eye drop to both eyes is followed by eye doctor    6. Osteoarthritis: is stable; will continue voltaren gel to left shoulder four times daily as needed   7. Constipation: will continue colace twice daily   8. CAD: is stable no complaint of chest pain present; will continue asa 81 mg daily   9. Edema: will continue lasix 40 mg daily   10. Right stump cellulitis: will being doxycycline 100 mg twice daily for 2 weeks with florastor twice daily will use triamcinolone 0.1 % twice daily to  skin      Synthia Innocent NP [facility]  Contact [phone] Monday through Friday 8am- 5pm  After hours call [phone]

## 2015-10-28 ENCOUNTER — Non-Acute Institutional Stay (SKILLED_NURSING_FACILITY): Payer: Medicare Other | Admitting: Adult Health

## 2015-10-28 DIAGNOSIS — Z89512 Acquired absence of left leg below knee: Secondary | ICD-10-CM | POA: Diagnosis not present

## 2015-10-28 DIAGNOSIS — M150 Primary generalized (osteo)arthritis: Secondary | ICD-10-CM | POA: Diagnosis not present

## 2015-10-28 DIAGNOSIS — Z89511 Acquired absence of right leg below knee: Secondary | ICD-10-CM

## 2015-10-28 DIAGNOSIS — R609 Edema, unspecified: Secondary | ICD-10-CM | POA: Diagnosis not present

## 2015-10-28 DIAGNOSIS — E113599 Type 2 diabetes mellitus with proliferative diabetic retinopathy without macular edema, unspecified eye: Secondary | ICD-10-CM | POA: Diagnosis not present

## 2015-10-28 DIAGNOSIS — E1365 Other specified diabetes mellitus with hyperglycemia: Secondary | ICD-10-CM

## 2015-10-28 DIAGNOSIS — IMO0002 Reserved for concepts with insufficient information to code with codable children: Secondary | ICD-10-CM

## 2015-10-28 DIAGNOSIS — M159 Polyosteoarthritis, unspecified: Secondary | ICD-10-CM

## 2015-10-28 DIAGNOSIS — N183 Chronic kidney disease, stage 3 (moderate): Secondary | ICD-10-CM | POA: Diagnosis not present

## 2015-10-28 DIAGNOSIS — E1322 Other specified diabetes mellitus with diabetic chronic kidney disease: Secondary | ICD-10-CM

## 2015-10-28 DIAGNOSIS — I129 Hypertensive chronic kidney disease with stage 1 through stage 4 chronic kidney disease, or unspecified chronic kidney disease: Secondary | ICD-10-CM | POA: Diagnosis not present

## 2015-10-28 DIAGNOSIS — E785 Hyperlipidemia, unspecified: Secondary | ICD-10-CM

## 2015-10-31 DIAGNOSIS — E785 Hyperlipidemia, unspecified: Secondary | ICD-10-CM | POA: Diagnosis not present

## 2015-10-31 LAB — LIPID PANEL
CHOLESTEROL: 93 mg/dL (ref 0–200)
HDL: 26 mg/dL — AB (ref 35–70)
LDL CALC: 39 mg/dL
Triglycerides: 139 mg/dL (ref 40–160)

## 2015-12-01 ENCOUNTER — Encounter: Payer: Self-pay | Admitting: Adult Health

## 2015-12-01 NOTE — Progress Notes (Signed)
Patient ID: Marc Wheeler, male   DOB: [birth year], 75 y.o.   MRN: [id number]    [facility]       No Known Allergies  Chief Complaint  Patient presents with  . Medical Management of Chronic Issues    HPI:  He is a long term resident of this facility being seen for the management of his chronic illnesses. Overall he is doing well. He has lost some weight; he tells me that he wanted to lose th weight. He is not voicing any concerns or complaints today. There are no nursing concerns at this time   Past Medical History  Diagnosis Date  . Diabetes mellitus without complication (HCC)   . Hyperlipidemia   . Hypertension   . Chronic kidney disease   . Edema   . Depression   . Diabetic retinopathy [facility])     Past Surgical History  Procedure Laterality Date  . Leg amputation below knee Bilateral   . Right retinal detachment repair  VITAL SIGNS BP 150/78 mmHg  Pulse 78  Ht  (1.27 m)  Wt 215 lb 3.2 oz (97.614 kg)  BMI 60.52 kg/m2  SpO2 96%  Patient's Medications  New Prescriptions   No medications on file  Previous Medications   ACETAMINOPHEN (TYLENOL) 325 MG TABLET    Take 650 mg by mouth every 4 (four) hours as needed for pain.   ALUM HYDROXIDE-MAG TRISILICATE (GAVISCON) 80-14.2 MG CHEW    Chew 1-2 tablets by mouth every 4 (four) hours as needed (indigestion- pain).   AMLODIPINE (NORVASC) 10 MG TABLET    Take 10 mg by mouth daily.   ASPIRIN EC 81 MG TABLET    Take 81 mg by mouth daily.   ATORVASTATIN (LIPITOR) 80 MG TABLET    Take 80 mg by mouth daily.   B COMPLEX-VITAMIN C-FOLIC ACID (NEPHRO-VITE) 0.8 MG TABS    Take 0.8 mg by mouth at bedtime.   CALCITRIOL (ROCALTROL) 0.25 MCG CAPSULE    Take 0.25 mcg by mouth 3 (three) times a week. On Monday; Wednesday; Friday   CLONIDINE (CATAPRES - DOSED IN MG/24 HR) 0.2 MG/24HR PATCH    Place 1 patch onto the skin once a week.   DOCUSATE SODIUM (COLACE) 100 MG CAPSULE    Take 100 mg by mouth 2 (two) times  daily.   FISH OIL-OMEGA-3 FATTY ACIDS 1000 MG CAPSULE    Take 1 g by mouth daily.   FUROSEMIDE (LASIX) 40 MG TABLET    Take 40 mg by mouth daily.   HYDRALAZINE (APRESOLINE) 50 MG TABLET    Take 1 tablet (50 mg total) by mouth 4 (four) times daily.   INSULIN ASPART (NOVOLOG) 100 UNIT/ML INJECTION    Inject 10 Units into the skin 3 (three) times daily before meals. For cbg >=150   INSULIN GLARGINE (TOUJEO SOLOSTAR) 300 UNIT/ML SOPN    Inject 70 Units into the skin daily.   LINAGLIPTIN (TRADJENTA) 5 MG TABS TABLET    Take 5 mg by mouth daily.   METOPROLOL (LOPRESSOR) 100 MG TABLET    Take 100 mg by mouth 2 (two) times daily.   POLYETHYL GLYCOL-PROPYL GLYCOL (SYSTANE) 0.4-0.3 % SOLN    Apply 1 drop to eye daily as needed. Both eyes  Modified Medications   No medications on file  Discontinued Medications   No medications on file     SIGNIFICANT DIAGNOSTIC EXAMS   01-05-15: EKG: SR with first degree  AVB   08-25-15: chest x-ray: Left lower lobe atelectasis.  No superimposed acute abnormality.    LABS REVIEWED:   11-03-14; hgb a1c 8.2; chol 98; ldl 45; trig 153; hdl 22 11-29-14: wbc 6.3; hgb 10.7; hct 34.8; mcv 84.3; plt 199; glucose 146; bun 31; creat 2.29; k+4.0; na++140; liver normal albumin 3.7; phos 3.3  02-21-15: hgb a1c 6.6 02-25-15: wbc 6.2; hgb 10.7; hct 33.7; mcv 83; plt 200; glucose 128; bun 30;creat 1.96; k+4.3; na++141; liver normal albumin 3.7; phos 3.7; ferritin 202; iron 56; tibc 202   02-27-15: urine culture: multiple bacteria  05-25-15: hgb a1c 6.3 06-01-15: wbc 5.5; hgb 10.5; hct 34.1; mcv 81.8; plt 183; glucose 86; bun 34; creat 2.22; k+ 4.0; na++143; liver normal albumin 3.9; phos 3.5; ferritin 313; iron 71; tibc 126  pth 91 06-03-15: urine micro-albumin 106.0  08-25-15: wbc 7.7; hgb 12.1; hct 36.9; mcv 82.9; plt 194; glucose 140; bun 33; creat 2.16; k+ 4.0; na++1141; alt 98; albumin 3.0  09-02-15: glucose 102; bun 32; creat 2.10; k+ 4.4; na+=142; liver normal albumin 3.4;ferritin  411; PTH 136; ca 9.2; hgb a1c 6.3     Review of Systems Constitutional: Negative for malaise/fatigue.  Respiratory: Negative for cough and shortness of breath.   Cardiovascular: Negative for chest pain and palpitations.  Gastrointestinal: Negative for heartburn, abdominal pain and constipation.  Musculoskeletal: Negative for myalgias and joint pain.  Skin:no complaints   Neurological: Negative for weakness and headaches.  Psychiatric/Behavioral: The patient is not nervous/anxious.    Physical Exam Constitutional: He is oriented to person, place, and time. He appears well-developed and well-nourished. No distress.  Obese   Neck: Neck supple. No JVD present. No thyromegaly present.  Cardiovascular: Normal rate and regular rhythm.   Respiratory: Effort normal and breath sounds normal. No respiratory distress.  GI: Soft. Bowel sounds are normal. He exhibits no distension. There is no tenderness.  Musculoskeletal:  Bilateral bka;  Is able to move upper extremities   Neurological: He is alert and oriented to person, place, and time.  Skin: Skin is warm and dry. He is not diaphoretic.       ASSESSMENT/ PLAN:   1. Diabetes: will continue tuojeo 70 units daily; novolog 10 units with meals for cbg >=150; tradjenta 5 mg daily; asa 81 mg daily; will monitor his hgb a1c is 6.3   2. Hypertensive renal disease: will continue norvasc 10 mg daily; lopressor 100 mg twice daily; clonidine 0.2 mg patch changed weekly;  hydralazine 50 mg four times daily; asa 81 mg daily   3. Dyslipidemia: will continue lipitor 80 mg daily; fish oil 1 gm daily his ldl is 45   4. 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 Diabetic retinopathy: is on systane eye drop to both eyes is followed by eye doctor   6. Osteoarthritis: is stable; will  continue voltaren gel to left shoulder four times daily as needed   7. Constipation: will continue colace twice daily   8. CAD: is stable no complaint of chest pain present; will continue asa 81 mg daily   9. Edema: will continue lasix 40 mg daily         :    Synthia Innocent NP [facility]  Contact [phone] Monday through Friday 8am- 5pm  After hours call [phone]

## 2015-12-02 ENCOUNTER — Non-Acute Institutional Stay (SKILLED_NURSING_FACILITY): Payer: Medicare Other | Admitting: Adult Health

## 2015-12-02 ENCOUNTER — Encounter: Payer: Self-pay | Admitting: Adult Health

## 2015-12-02 DIAGNOSIS — E1122 Type 2 diabetes mellitus with diabetic chronic kidney disease: Secondary | ICD-10-CM

## 2015-12-02 DIAGNOSIS — I25119 Atherosclerotic heart disease of native coronary artery with unspecified angina pectoris: Secondary | ICD-10-CM

## 2015-12-02 DIAGNOSIS — M150 Primary generalized (osteo)arthritis: Secondary | ICD-10-CM | POA: Diagnosis not present

## 2015-12-02 DIAGNOSIS — Z89512 Acquired absence of left leg below knee: Secondary | ICD-10-CM | POA: Diagnosis not present

## 2015-12-02 DIAGNOSIS — M159 Polyosteoarthritis, unspecified: Secondary | ICD-10-CM

## 2015-12-02 DIAGNOSIS — IMO0002 Reserved for concepts with insufficient information to code with codable children: Secondary | ICD-10-CM

## 2015-12-02 DIAGNOSIS — E1322 Other specified diabetes mellitus with diabetic chronic kidney disease: Secondary | ICD-10-CM

## 2015-12-02 DIAGNOSIS — I129 Hypertensive chronic kidney disease with stage 1 through stage 4 chronic kidney disease, or unspecified chronic kidney disease: Secondary | ICD-10-CM

## 2015-12-02 DIAGNOSIS — E1365 Other specified diabetes mellitus with hyperglycemia: Secondary | ICD-10-CM

## 2015-12-02 DIAGNOSIS — Z89511 Acquired absence of right leg below knee: Secondary | ICD-10-CM | POA: Diagnosis not present

## 2015-12-02 DIAGNOSIS — N183 Chronic kidney disease, stage 3 (moderate): Secondary | ICD-10-CM | POA: Diagnosis not present

## 2015-12-02 DIAGNOSIS — R609 Edema, unspecified: Secondary | ICD-10-CM | POA: Diagnosis not present

## 2015-12-02 NOTE — Progress Notes (Signed)
Patient ID: Marc Wheeler, male   DOB: 1941/05/13, 75 y.o.   MRN: [id number]   [facility]       No Known Allergies  Chief Complaint  Patient presents with  . Medical Management of Chronic Issues    Routine Visit     HPI:  He is a long term resident of this facility being seen for the management of his chronic illnesses. Overall his status is stable. He is not voicing any complaints or concerns today. There are no nursing concerns today.     Past Medical History  Diagnosis Date  . Diabetes mellitus without complication (HCC)   . Hyperlipidemia   . Hypertension   . Chronic kidney disease   . Edema   . Depression   . Diabetic retinopathy (HCC)   . Hyperlipidemia LDL goal <100 07/16/2014    Past Surgical History  Procedure Laterality Date  . Leg amputation below knee Bilateral   . Right retinal detachment repair  VITAL SIGNS BP 78/44 mmHg  Pulse 78  Temp(Src) 97.6 F (36.4 C) (Oral)  Resp 18  Ht  (1.27 m)  Wt 216 lb (97.977 kg)  BMI 60.75 kg/m2  SpO2 97%  Patient's Medications  New Prescriptions   No medications on file  Previous Medications   ACETAMINOPHEN (TYLENOL) 325 MG TABLET    Take 650 mg by mouth every 4 (four) hours as needed for pain.   ALUM HYDROXIDE-MAG TRISILICATE (GAVISCON) 80-14.2 MG CHEW    Chew 2 tablets by mouth every 4 (four) hours as needed (for indigestion).   AMLODIPINE (NORVASC) 10 MG TABLET    Take 10 mg by mouth daily.   ASPIRIN EC 81 MG TABLET    Take 81 mg by mouth daily.   ATORVASTATIN (LIPITOR) 80 MG TABLET    Take 80 mg by mouth daily.   B COMPLEX-VITAMIN C-FOLIC ACID (NEPHRO-VITE) 0.8 MG TABS    Take 0.8 mg by mouth at bedtime.   CALCITRIOL (ROCALTROL) 0.25 MCG CAPSULE    Take 0.25 mcg by mouth 3 (three) times a week. On Monday; Wednesday; Friday   CLONIDINE (CATAPRES - DOSED IN MG/24 HR) 0.2 MG/24HR PATCH    Place 1 patch onto the skin once a week.   DOCUSATE SODIUM (COLACE) 100 MG CAPSULE    Take 100  mg by mouth 2 (two) times daily.   FISH OIL-OMEGA-3 FATTY ACIDS 1000 MG CAPSULE    Take 1 g by mouth daily.   FUROSEMIDE (LASIX) 40 MG TABLET    Take 40 mg by mouth daily.   GUAIFENESIN (ROBITUSSIN) 100 MG/5ML SOLN    Take 10 mLs by mouth every 6 (six) hours as needed for cough or to loosen phlegm.   HYDRALAZINE (APRESOLINE) 50 MG TABLET    Take 1 tablet (50 mg total) by mouth 4 (four) times daily.   INSULIN GLARGINE (TOUJEO SOLOSTAR) 300 UNIT/ML SOPN    Inject 70 Units into the skin daily.   INSULIN LISPRO (HUMALOG KWIKPEN) 100 UNIT/ML KIWKPEN    Inject into the skin. Per sliding scale 0-149= 0 units, 150+ = 10 units subcutaneously before meals for diabetes   LINAGLIPTIN (TRADJENTA) 5 MG TABS TABLET    Take 5 mg by mouth daily.   METOPROLOL (LOPRESSOR) 100 MG TABLET    Take 100 mg by mouth 2 (two) times daily.   POLYETHYL GLYCOL-PROPYL GLYCOL (SYSTANE) 0.4-0.3 % SOLN    Apply 1 drop to eye daily  as needed. Both eyes  Modified Medications   No medications on file  Discontinued Medications     SIGNIFICANT DIAGNOSTIC EXAMS   01-05-15: EKG: SR with first degree AVB   08-25-15: chest x-ray: Left lower lobe atelectasis.  No superimposed acute abnormality.    LABS REVIEWED:   02-21-15: hgb a1c 6.6 02-25-15: wbc 6.2; hgb 10.7; hct 33.7; mcv 83; plt 200; glucose 128; bun 30;creat 1.96; k+4.3; na++141; liver normal albumin 3.7; phos 3.7; ferritin 202; iron 56; tibc 202   02-27-15: urine culture: multiple bacteria  05-25-15: hgb a1c 6.3 06-01-15: wbc 5.5; hgb 10.5; hct 34.1; mcv 81.8; plt 183; glucose 86; bun 34; creat 2.22; k+ 4.0; na++143; liver normal albumin 3.9; phos 3.5; ferritin 313; iron 71; tibc 126  pth 91 06-03-15: urine micro-albumin 106.0  08-25-15: wbc 7.7; hgb 12.1; hct 36.9; mcv 82.9; plt 194; glucose 140; bun 33; creat 2.16; k+ 4.0; na++1141; alt 98; albumin 3.0  09-02-15: glucose 102; bun 32; creat 2.10; k+ 4.4; na+=142; liver normal albumin 3.4;ferritin 411; PTH 136; ca 9.2; hgb a1c 6.3   10-31-15: chol 93; ldl 39; trig 139; hdl 26     Review of Systems Constitutional: Negative for malaise/fatigue.  Respiratory: Negative for cough and shortness of breath.   Cardiovascular: Negative for chest pain and palpitations.  Gastrointestinal: Negative for heartburn, abdominal pain and constipation.  Musculoskeletal: Negative for myalgias and joint pain.  Skin:no complaints   Neurological: Negative for weakness and headaches.  Psychiatric/Behavioral: The patient is not nervous/anxious.    Physical Exam Constitutional: He is oriented to person, place, and time. He appears well-developed and well-nourished. No distress.  Obese   Neck: Neck supple. No JVD present. No thyromegaly present.  Cardiovascular: Normal rate and regular rhythm.   Respiratory: Effort normal and breath sounds normal. No respiratory distress.  GI: Soft. Bowel sounds are normal. He exhibits no distension. There is no tenderness.  Musculoskeletal:  Bilateral bka;  Is able to move upper extremities   Neurological: He is alert and oriented to person, place, and time.  Skin: Skin is warm and dry. He is not diaphoretic.       ASSESSMENT/ PLAN:   1. Diabetes: will continue tuojeo 70 units daily; novolog 10 units with meals for cbg >=150; tradjenta 5 mg daily; asa 81 mg daily; will monitor his hgb a1c is 6.3   2. Hypertensive renal disease: will continue norvasc 10 mg daily; lopressor 100 mg twice daily; clonidine 0.2 mg patch changed weekly;  hydralazine 50 mg four times daily; asa 81 mg daily   3. Dyslipidemia: will continue lipitor 80 mg daily; fish oil 1 gm daily his ldl is 39   4. 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 Diabetic retinopathy: is on systane eye drop to both eyes is followed by eye doctor   6. Osteoarthritis: is stable;  will continue voltaren gel to left shoulder four times daily as needed   7. Constipation: will continue colace twice daily   8. CAD: is stable no complaint of chest pain present; will continue asa 81 mg daily   9. Edema: will continue lasix 40 mg daily          Synthia Innocent NP [facility]  Contact [phone] Monday through Friday 8am- 5pm  After hours call [phone]

## 2015-12-29 ENCOUNTER — Non-Acute Institutional Stay (SKILLED_NURSING_FACILITY): Payer: Medicare Other | Admitting: Adult Health

## 2015-12-29 ENCOUNTER — Encounter: Payer: Self-pay | Admitting: Adult Health

## 2015-12-29 DIAGNOSIS — I129 Hypertensive chronic kidney disease with stage 1 through stage 4 chronic kidney disease, or unspecified chronic kidney disease: Secondary | ICD-10-CM | POA: Diagnosis not present

## 2015-12-29 DIAGNOSIS — E1122 Type 2 diabetes mellitus with diabetic chronic kidney disease: Secondary | ICD-10-CM | POA: Diagnosis not present

## 2015-12-29 DIAGNOSIS — E1322 Other specified diabetes mellitus with diabetic chronic kidney disease: Secondary | ICD-10-CM

## 2015-12-29 DIAGNOSIS — E1365 Other specified diabetes mellitus with hyperglycemia: Secondary | ICD-10-CM | POA: Diagnosis not present

## 2015-12-29 DIAGNOSIS — I25119 Atherosclerotic heart disease of native coronary artery with unspecified angina pectoris: Secondary | ICD-10-CM | POA: Diagnosis not present

## 2015-12-29 DIAGNOSIS — N183 Chronic kidney disease, stage 3 unspecified: Secondary | ICD-10-CM

## 2015-12-29 DIAGNOSIS — E113599 Type 2 diabetes mellitus with proliferative diabetic retinopathy without macular edema, unspecified eye: Secondary | ICD-10-CM

## 2015-12-29 DIAGNOSIS — IMO0002 Reserved for concepts with insufficient information to code with codable children: Secondary | ICD-10-CM

## 2015-12-29 DIAGNOSIS — E785 Hyperlipidemia, unspecified: Secondary | ICD-10-CM | POA: Diagnosis not present

## 2015-12-29 DIAGNOSIS — F3289 Other specified depressive episodes: Secondary | ICD-10-CM | POA: Diagnosis not present

## 2015-12-29 NOTE — Progress Notes (Signed)
Patient ID: Marc Wheeler, male   DOB: 1941/07/14, 75 y.o.   MRN: [id number]   Facility:  [facility]       No Known Allergies  Chief Complaint  Patient presents with  . Medical Management of Chronic Issues    Follow up    HPI:  He is a long term resident of this facility being seen for the management of his chronic illnesses. Overall his status is stable. He tells me that he is feeling good. There are no nursing concerns at this time.   Past Medical History  Diagnosis Date  . Diabetes mellitus without complication (HCC)   . Hyperlipidemia   . Hypertension   . Chronic kidney disease   . Edema   . Depression   . Diabetic retinopathy (HCC)   . Hyperlipidemia LDL goal <100 07/16/2014    Past Surgical History  Procedure Laterality Date  . Leg amputation below knee Bilateral   . Right retinal detachment repair  VITAL SIGNS BP 145/69 mmHg  Pulse 98  Temp(Src) 97.8 F (36.6 C) (Oral)  Resp 18  Ht  (1.27 m)  Wt 225 lb (102.059 kg)  BMI 63.28 kg/m2  SpO2 97%  Patient's Medications  New Prescriptions   No medications on file  Previous Medications   ACETAMINOPHEN (TYLENOL) 325 MG TABLET    Take 650 mg by mouth every 4 (four) hours as needed for pain.   ALUM HYDROXIDE-MAG TRISILICATE (GAVISCON) 80-14.2 MG CHEW    Chew 2 tablets by mouth every 4 (four) hours as needed (for indigestion).   AMLODIPINE (NORVASC) 10 MG TABLET    Take 10 mg by mouth daily. Reported on 12/29/2015   ATORVASTATIN (LIPITOR) 80 MG TABLET    Take 80 mg by mouth daily.   B COMPLEX-VITAMIN C-FOLIC ACID (NEPHRO-VITE) 0.8 MG TABS    Take 0.8 mg by mouth at bedtime.   CALCITRIOL (ROCALTROL) 0.25 MCG CAPSULE    Take 0.25 mcg by mouth 3 (three) times a week. On Monday; Wednesday; Friday   CLONIDINE (CATAPRES - DOSED IN MG/24 HR) 0.2 MG/24HR PATCH    Place 1 patch onto the skin once a week.   DOCUSATE SODIUM (COLACE) 100 MG CAPSULE    Take 100 mg by mouth 2 (two) times daily.   FISH OIL-OMEGA-3  FATTY ACIDS 1000 MG CAPSULE    Take 1 g by mouth daily.   FUROSEMIDE (LASIX) 40 MG TABLET    Take 40 mg by mouth daily.   GUAIFENESIN (ROBITUSSIN) 100 MG/5ML SOLN    Take 10 mLs by mouth every 6 (six) hours as needed for cough or to loosen phlegm.   HYDRALAZINE (APRESOLINE) 50 MG TABLET    Take 1 tablet (50 mg total) by mouth 4 (four) times daily.   INSULIN GLARGINE (TOUJEO SOLOSTAR) 300 UNIT/ML SOPN    Inject 70 Units into the skin daily.   INSULIN LISPRO (HUMALOG KWIKPEN) 100 UNIT/ML KIWKPEN    Inject into the skin. Per sliding scale 0-149= 0 units, 150+ = 10 units subcutaneously before meals for diabetes   LINAGLIPTIN (TRADJENTA) 5 MG TABS TABLET    Take 5 mg by mouth daily.   METOPROLOL (LOPRESSOR) 100 MG TABLET    Take 100 mg by mouth 2 (two) times daily.   POLYETHYL GLYCOL-PROPYL GLYCOL (SYSTANE) 0.4-0.3 % SOLN    Apply 1 drop to eye daily as needed. Both eyes  Modified Medications   No medications on file  Discontinued  Medications     SIGNIFICANT DIAGNOSTIC EXAMS  01-05-15: EKG: SR with first degree AVB   08-25-15: chest x-ray: Left lower lobe atelectasis.  No superimposed acute abnormality.    LABS REVIEWED:   02-21-15: hgb a1c 6.6 02-25-15: wbc 6.2; hgb 10.7; hct 33.7; mcv 83; plt 200; glucose 128; bun 30;creat 1.96; k+4.3; na++141; liver normal albumin 3.7; phos 3.7; ferritin 202; iron 56; tibc 202   02-27-15: urine culture: multiple bacteria  05-25-15: hgb a1c 6.3 06-01-15: wbc 5.5; hgb 10.5; hct 34.1; mcv 81.8; plt 183; glucose 86; bun 34; creat 2.22; k+ 4.0; na++143; liver normal albumin 3.9; phos 3.5; ferritin 313; iron 71; tibc 126  pth 91 06-03-15: urine micro-albumin 106.0  08-25-15: wbc 7.7; hgb 12.1; hct 36.9; mcv 82.9; plt 194; glucose 140; bun 33; creat 2.16; k+ 4.0; na++1141; alt 98; albumin 3.0  09-02-15: glucose 102; bun 32; creat 2.10; k+ 4.4; na+=142; liver normal albumin 3.4;ferritin 411; PTH 136; ca 9.2; hgb a1c 6.3  10-31-15: chol 93; ldl 39; trig 139; hdl 26      Review of Systems Constitutional: Negative for malaise/fatigue.  Respiratory: Negative for cough and shortness of breath.   Cardiovascular: Negative for chest pain and palpitations.  Gastrointestinal: Negative for heartburn, abdominal pain and constipation.  Musculoskeletal: Negative for myalgias and joint pain.  Skin:no complaints   Neurological: Negative for weakness and headaches.  Psychiatric/Behavioral: The patient is not nervous/anxious.    Physical Exam Constitutional: He is oriented to person, place, and time. He appears well-developed and well-nourished. No distress.  Obese   Neck: Neck supple. No JVD present. No thyromegaly present.  Cardiovascular: Normal rate and regular rhythm.   Respiratory: Effort normal and breath sounds normal. No respiratory distress.  GI: Soft. Bowel sounds are normal. He exhibits no distension. There is no tenderness.  Musculoskeletal:  Bilateral bka;  Is able to move upper extremities   Neurological: He is alert and oriented to person, place, and time.  Skin: Skin is warm and dry. He is not diaphoretic.       ASSESSMENT/ PLAN:   1. Diabetes: will continue tuojeo 70 units daily; novolog 10 units with meals for cbg >=150; tradjenta 5 mg daily;  will monitor his hgb a1c is 6.3   2. Hypertensive renal disease: will continue norvasc 10 mg daily; lopressor 100 mg twice daily; clonidine 0.2 mg patch changed weekly;  hydralazine 50 mg four times daily;   3. Dyslipidemia: will continue lipitor 80 mg daily; fish oil 1 gm daily his ldl is 39   4. 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 Diabetic retinopathy: is on systane eye drop to both eyes is followed by eye doctor   6. Osteoarthritis: is stable; will continue voltaren gel to left shoulder four times daily as needed    7. Constipation: will continue colace twice daily   8. CAD: is stable no complaint of chest pain present; will continue lopressor 100 mg twice daily    9. Edema: will continue lasix 40 mg daily              Synthia Innocenteborah Brandi Armato NP [facility]  Contact [phone] Monday through Friday 8am- 5pm  After hours call [phone]

## 2016-01-12 DIAGNOSIS — D631 Anemia in chronic kidney disease: Secondary | ICD-10-CM | POA: Diagnosis not present

## 2016-01-12 DIAGNOSIS — R609 Edema, unspecified: Secondary | ICD-10-CM | POA: Diagnosis not present

## 2016-01-12 DIAGNOSIS — R809 Proteinuria, unspecified: Secondary | ICD-10-CM | POA: Diagnosis not present

## 2016-01-12 DIAGNOSIS — N189 Chronic kidney disease, unspecified: Secondary | ICD-10-CM | POA: Diagnosis not present

## 2016-01-12 DIAGNOSIS — E1165 Type 2 diabetes mellitus with hyperglycemia: Secondary | ICD-10-CM | POA: Diagnosis not present

## 2016-01-12 DIAGNOSIS — E1129 Type 2 diabetes mellitus with other diabetic kidney complication: Secondary | ICD-10-CM | POA: Diagnosis not present

## 2016-01-12 DIAGNOSIS — I129 Hypertensive chronic kidney disease with stage 1 through stage 4 chronic kidney disease, or unspecified chronic kidney disease: Secondary | ICD-10-CM | POA: Diagnosis not present

## 2016-01-12 DIAGNOSIS — N2581 Secondary hyperparathyroidism of renal origin: Secondary | ICD-10-CM | POA: Diagnosis not present

## 2016-01-12 DIAGNOSIS — N183 Chronic kidney disease, stage 3 (moderate): Secondary | ICD-10-CM | POA: Diagnosis not present

## 2016-01-13 DIAGNOSIS — D509 Iron deficiency anemia, unspecified: Secondary | ICD-10-CM | POA: Diagnosis not present

## 2016-01-13 DIAGNOSIS — N179 Acute kidney failure, unspecified: Secondary | ICD-10-CM | POA: Diagnosis not present

## 2016-01-30 ENCOUNTER — Non-Acute Institutional Stay (SKILLED_NURSING_FACILITY): Payer: Medicare Other | Admitting: Adult Health

## 2016-01-30 ENCOUNTER — Encounter: Payer: Self-pay | Admitting: Adult Health

## 2016-01-30 DIAGNOSIS — E1169 Type 2 diabetes mellitus with other specified complication: Secondary | ICD-10-CM

## 2016-01-30 DIAGNOSIS — E785 Hyperlipidemia, unspecified: Secondary | ICD-10-CM

## 2016-01-30 DIAGNOSIS — E1365 Other specified diabetes mellitus with hyperglycemia: Secondary | ICD-10-CM | POA: Diagnosis not present

## 2016-01-30 DIAGNOSIS — IMO0002 Reserved for concepts with insufficient information to code with codable children: Secondary | ICD-10-CM

## 2016-01-30 DIAGNOSIS — N183 Chronic kidney disease, stage 3 (moderate): Secondary | ICD-10-CM

## 2016-01-30 DIAGNOSIS — Z89512 Acquired absence of left leg below knee: Secondary | ICD-10-CM | POA: Diagnosis not present

## 2016-01-30 DIAGNOSIS — I129 Hypertensive chronic kidney disease with stage 1 through stage 4 chronic kidney disease, or unspecified chronic kidney disease: Secondary | ICD-10-CM

## 2016-01-30 DIAGNOSIS — E1122 Type 2 diabetes mellitus with diabetic chronic kidney disease: Secondary | ICD-10-CM | POA: Diagnosis not present

## 2016-01-30 DIAGNOSIS — Z89511 Acquired absence of right leg below knee: Secondary | ICD-10-CM

## 2016-01-30 DIAGNOSIS — E1322 Other specified diabetes mellitus with diabetic chronic kidney disease: Secondary | ICD-10-CM | POA: Diagnosis not present

## 2016-01-30 LAB — BASIC METABOLIC PANEL: Glucose: 137 mg/dL

## 2016-01-30 NOTE — Progress Notes (Signed)
Patient ID: Marc Wheeler, male   DOB: 1941-04-08, 75 y.o.   MRN: [id number]   Facility: [facility]       No Known Allergies  Chief Complaint  Patient presents with  . Medical Management of Chronic Issues    Follow up    HPI:  He is a long term resident of this facility being seen for the management of his chronic illnesses. Overall his status is stable. He is not voicing any concerns or complaints at this time. There are no nursing concerns at this time.    Past Medical History  Diagnosis Date  . Diabetes mellitus without complication (HCC)   . Hyperlipidemia   . Hypertension   . Chronic kidney disease   . Edema   . Depression   . Diabetic retinopathy (HCC)   . Hyperlipidemia LDL goal <100 07/16/2014    Past Surgical History  Procedure Laterality Date  . Leg amputation below knee Bilateral   . Right retinal detachment repair  VITAL SIGNS BP 150/70 mmHg  Pulse 81  Temp(Src) 97.5 F (36.4 C) (Oral)  Resp 16  Ht  (1.27 m)  Wt 223 lb 6 oz (101.322 kg)  BMI 62.82 kg/m2  SpO2 95%  Patient's Medications  New Prescriptions   No medications on file  Previous Medications   ACETAMINOPHEN (TYLENOL) 325 MG TABLET    Take 650 mg by mouth every 4 (four) hours as needed for pain.   ALUM HYDROXIDE-MAG TRISILICATE (GAVISCON) 80-14.2 MG CHEW    Chew 2 tablets by mouth every 4 (four) hours as needed (for indigestion).   AMLODIPINE (NORVASC) 10 MG TABLET    Take 10 mg by mouth daily. Reported on 12/29/2015   ASPIRIN 81 MG TABLET    Take 81 mg by mouth daily.   ATORVASTATIN (LIPITOR) 80 MG TABLET    Take 80 mg by mouth daily.   B COMPLEX-VITAMIN C-FOLIC ACID (NEPHRO-VITE) 0.8 MG TABS    Take 0.8 mg by mouth at bedtime.   CALCITRIOL (ROCALTROL) 0.25 MCG CAPSULE    Take 0.25 mcg by mouth 3 (three) times a week. On Monday; Wednesday; Friday   CLONIDINE (CATAPRES - DOSED IN MG/24 HR) 0.2 MG/24HR PATCH    Place 1 patch onto the skin once a week.   DOCUSATE SODIUM  (COLACE) 100 MG CAPSULE    Take 100 mg by mouth 2 (two) times daily.   FISH OIL-OMEGA-3 FATTY ACIDS 1000 MG CAPSULE    Take 1 g by mouth daily.   FUROSEMIDE (LASIX) 40 MG TABLET    Take 40 mg by mouth daily.   GUAIFENESIN (ROBITUSSIN) 100 MG/5ML SOLN    Take 10 mLs by mouth every 6 (six) hours as needed for cough or to loosen phlegm.   HYDRALAZINE (APRESOLINE) 50 MG TABLET    Take 1 tablet (50 mg total) by mouth 4 (four) times daily.   INSULIN GLARGINE (TOUJEO SOLOSTAR) 300 UNIT/ML SOPN    Inject 70 Units into the skin daily.   INSULIN LISPRO (HUMALOG KWIKPEN) 100 UNIT/ML KIWKPEN    Inject into the skin. Per sliding scale 0-149= 0 units, 150+ = 10 units subcutaneously before meals for diabetes   LINAGLIPTIN (TRADJENTA) 5 MG TABS TABLET    Take 5 mg by mouth daily.   MAGNESIUM HYDROXIDE (MILK OF MAGNESIA) 400 MG/5ML SUSPENSION    Take 30 mLs by mouth daily as needed for mild constipation.   METOPROLOL (LOPRESSOR) 100 MG TABLET  Take 100 mg by mouth 2 (two) times daily.   POLYETHYL GLYCOL-PROPYL GLYCOL (SYSTANE) 0.4-0.3 % SOLN    Apply 1 drop to eye daily as needed. Both eyes  Modified Medications   No medications on file  Discontinued Medications   No medications on file     SIGNIFICANT DIAGNOSTIC EXAMS  01-05-15: EKG: SR with first degree AVB   08-25-15: chest x-ray: Left lower lobe atelectasis.  No superimposed acute abnormality.    LABS REVIEWED:   02-21-15: hgb a1c 6.6 02-25-15: wbc 6.2; hgb 10.7; hct 33.7; mcv 83; plt 200; glucose 128; bun 30;creat 1.96; k+4.3; na++141; liver normal albumin 3.7; phos 3.7; ferritin 202; iron 56; tibc 202   02-27-15: urine culture: multiple bacteria  05-25-15: hgb a1c 6.3 06-01-15: wbc 5.5; hgb 10.5; hct 34.1; mcv 81.8; plt 183; glucose 86; bun 34; creat 2.22; k+ 4.0; na++143; liver normal albumin 3.9; phos 3.5; ferritin 313; iron 71; tibc 126  pth 91 06-03-15: urine micro-albumin 106.0  08-25-15: wbc 7.7; hgb 12.1; hct 36.9; mcv 82.9; plt 194; glucose 140;  bun 33; creat 2.16; k+ 4.0; na++1141; alt 98; albumin 3.0  09-02-15: glucose 102; bun 32; creat 2.10; k+ 4.4; na++142; liver normal albumin 3.4;ferritin 411; PTH 136; ca 9.2; hgb a1c 6.3  10-31-15: chol 93; ldl 39; trig 139; hdl 26  1-61-09: wbc 5.9; hgb 10.2; hct 32.4 mcv 82.9; plt 192; glucose 112; bun 46; create 2.43; k+ 4.4; na++144; liver normal albumin 3.5; phos 3.9; ferritin 378; iron 39; tibc 180   Review of Systems Constitutional: Negative for malaise/fatigue.  Respiratory: Negative for cough and shortness of breath.   Cardiovascular: Negative for chest pain and palpitations.  Gastrointestinal: Negative for heartburn, abdominal pain and constipation.  Musculoskeletal: Negative for myalgias and joint pain.  Skin:no complaints   Neurological: Negative for weakness and headaches.  Psychiatric/Behavioral: The patient is not nervous/anxious.    Physical Exam Constitutional: He is oriented to person, place, and time. He appears well-developed and well-nourished. No distress.  Obese   Neck: Neck supple. No JVD present. No thyromegaly present.  Cardiovascular: Normal rate and regular rhythm.   Respiratory: Effort normal and breath sounds normal. No respiratory distress.  GI: Soft. Bowel sounds are normal. He exhibits no distension. There is no tenderness.  Musculoskeletal:  Bilateral bka;  Is able to move upper extremities   Neurological: He is alert and oriented to person, place, and time.  Skin: Skin is warm and dry. He is not diaphoretic.       ASSESSMENT/ PLAN:   1. Diabetes: will continue tuojeo 70 units daily; novolog 10 units with meals for cbg >=150; tradjenta 5 mg daily;  will monitor his hgb a1c is 6.3   2. Hypertensive renal disease: will continue norvasc 10 mg daily; lopressor 100 mg twice daily; clonidine 0.2 mg patch changed weekly;  hydralazine 50 mg four times daily;   3. Dyslipidemia: will continue lipitor 80 mg daily; fish oil 1 gm daily his ldl is 39   4.  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 Diabetic retinopathy: is on systane eye drop to both eyes is followed by eye doctor   6. Osteoarthritis: is stable; will continue voltaren gel to left shoulder four times daily as needed   7. Constipation: will continue colace twice daily  8. CAD: is stable no complaint of chest pain present; will continue lopressor 100 mg twice daily    9. Edema: will continue lasix 40 mg daily      Will check hgb a1c     Synthia Innocenteborah Starkeisha Vanwinkle NP [facility]  Contact [phone] Monday through Friday 8am- 5pm  After hours call [phone]

## 2016-02-01 DIAGNOSIS — E0821 Diabetes mellitus due to underlying condition with diabetic nephropathy: Secondary | ICD-10-CM | POA: Diagnosis not present

## 2016-02-01 LAB — HEMOGLOBIN A1C: HEMOGLOBIN A1C: 7.4

## 2016-02-10 DIAGNOSIS — E1169 Type 2 diabetes mellitus with other specified complication: Secondary | ICD-10-CM | POA: Insufficient documentation

## 2016-02-10 DIAGNOSIS — E785 Hyperlipidemia, unspecified: Secondary | ICD-10-CM

## 2016-02-23 ENCOUNTER — Non-Acute Institutional Stay (SKILLED_NURSING_FACILITY): Payer: Medicare Other | Admitting: Adult Health

## 2016-02-23 ENCOUNTER — Encounter: Payer: Self-pay | Admitting: Adult Health

## 2016-02-23 DIAGNOSIS — E1322 Other specified diabetes mellitus with diabetic chronic kidney disease: Secondary | ICD-10-CM | POA: Diagnosis not present

## 2016-02-23 DIAGNOSIS — I129 Hypertensive chronic kidney disease with stage 1 through stage 4 chronic kidney disease, or unspecified chronic kidney disease: Secondary | ICD-10-CM

## 2016-02-23 DIAGNOSIS — R609 Edema, unspecified: Secondary | ICD-10-CM

## 2016-02-23 DIAGNOSIS — L03115 Cellulitis of right lower limb: Secondary | ICD-10-CM | POA: Diagnosis not present

## 2016-02-23 DIAGNOSIS — N183 Chronic kidney disease, stage 3 unspecified: Secondary | ICD-10-CM

## 2016-02-23 DIAGNOSIS — E1365 Other specified diabetes mellitus with hyperglycemia: Secondary | ICD-10-CM | POA: Diagnosis not present

## 2016-02-23 DIAGNOSIS — E785 Hyperlipidemia, unspecified: Secondary | ICD-10-CM

## 2016-02-23 DIAGNOSIS — E1122 Type 2 diabetes mellitus with diabetic chronic kidney disease: Secondary | ICD-10-CM | POA: Diagnosis not present

## 2016-02-23 DIAGNOSIS — E1169 Type 2 diabetes mellitus with other specified complication: Secondary | ICD-10-CM | POA: Diagnosis not present

## 2016-02-23 DIAGNOSIS — IMO0002 Reserved for concepts with insufficient information to code with codable children: Secondary | ICD-10-CM

## 2016-02-23 NOTE — Progress Notes (Signed)
Patient ID: Karin LieuJimmy Laster, male   DOB: 02/10/1941, 75 y.o.   MRN: [id number]    [facility]     CODE STATUS: Full Code  No Known Allergies  Chief Complaint  Patient presents with  . Medical Management of Chronic Issues    Follow up    HPI:  He is a long term resident of this facility being seen for the management of his chronic illnesses. His right stump is inflamed hot with the inflammation getting worse over the past several days. There is not pain. There are no reports of fever present. There is increased edema present in his right stump.    Past Medical History  Diagnosis Date  . Diabetes mellitus without complication (HCC)   . Hyperlipidemia   . Hypertension   . Chronic kidney disease   . Edema   . Depression   . Diabetic retinopathy (HCC)   . Hyperlipidemia LDL goal <100 07/16/2014    Past Surgical History  Procedure Laterality Date  . Leg amputation below knee Bilateral   . Right retinal detachment repair  1 22 14     Social History   Social History  . Marital Status: Widowed    Spouse Name: N/A  . Number of Children: N/A  . Years of Education: N/A   Occupational History  . Not on file.   Social History Main Topics  . Smoking status: Former Games developermoker  . Smokeless tobacco: Not on file  . Alcohol Use: No  . Drug Use: No  . Sexual Activity: No   Other Topics Concern  . Not on file   Social History Narrative   History reviewed. No pertinent family history.    VITAL SIGNS BP 146/78 mmHg  Pulse 64  Temp(Src) 97.6 F (36.4 C) (Oral)  Resp 20  Ht 4\' 2"  (1.27 m)  Wt 220 lb 6 oz (99.961 kg)  BMI 61.98 kg/m2  SpO2 96%  Patient's Medications  New Prescriptions   No medications on file  Previous Medications   ACETAMINOPHEN (TYLENOL) 325 MG TABLET    Take 650 mg by mouth every 4 (four) hours as needed for pain.   ALUM HYDROXIDE-MAG TRISILICATE (GAVISCON) 80-14.2 MG CHEW    Chew 2 tablets by mouth every 4 (four) hours as needed (for  indigestion).   AMLODIPINE (NORVASC) 10 MG TABLET    Take 10 mg by mouth daily. Reported on 12/29/2015   ASPIRIN 81 MG TABLET    Take 81 mg by mouth daily.   ATORVASTATIN (LIPITOR) 80 MG TABLET    Take 80 mg by mouth daily.   B COMPLEX-VITAMIN C-FOLIC ACID (NEPHRO-VITE) 0.8 MG TABS    Take 0.8 mg by mouth at bedtime.   CALCITRIOL (ROCALTROL) 0.25 MCG CAPSULE    Take 0.25 mcg by mouth 3 (three) times a week. On Monday; Wednesday; Friday   CLONIDINE (CATAPRES - DOSED IN MG/24 HR) 0.2 MG/24HR PATCH    Place 1 patch onto the skin once a week.   DOCUSATE SODIUM (COLACE) 100 MG CAPSULE    Take 100 mg by mouth 2 (two) times daily.   FISH OIL-OMEGA-3 FATTY ACIDS 1000 MG CAPSULE    Take 1 g by mouth daily.   FUROSEMIDE (LASIX) 40 MG TABLET    Take 40 mg by mouth daily.   GUAIFENESIN (ROBITUSSIN) 100 MG/5ML SOLN    Take 10 mLs by mouth every 6 (six) hours as needed for cough or to loosen phlegm.   HYDRALAZINE (APRESOLINE) 50 MG  TABLET    Take 1 tablet (50 mg total) by mouth 4 (four) times daily.   INSULIN GLARGINE (TOUJEO SOLOSTAR) 300 UNIT/ML SOPN    Inject 70 Units into the skin daily.   INSULIN LISPRO (HUMALOG KWIKPEN) 100 UNIT/ML KIWKPEN    Inject into the skin. Per sliding scale 0-149= 0 units, 150+ = 10 units subcutaneously before meals for diabetes   LINAGLIPTIN (TRADJENTA) 5 MG TABS TABLET    Take 5 mg by mouth daily.   MAGNESIUM HYDROXIDE (MILK OF MAGNESIA) 400 MG/5ML SUSPENSION    Take 30 mLs by mouth daily as needed for mild constipation.   METOPROLOL (LOPRESSOR) 100 MG TABLET    Take 100 mg by mouth 2 (two) times daily.   POLYETHYL GLYCOL-PROPYL GLYCOL (SYSTANE) 0.4-0.3 % SOLN    Apply 1 drop to eye daily as needed. Both eyes  Modified Medications   No medications on file  Discontinued Medications   No medications on file     SIGNIFICANT DIAGNOSTIC EXAMS  01-05-15: EKG: SR with first degree AVB   08-25-15: chest x-ray: Left lower lobe atelectasis.  No superimposed acute  abnormality.    LABS REVIEWED:   02-25-15: wbc 6.2; hgb 10.7; hct 33.7; mcv 83; plt 200; glucose 128; bun 30;creat 1.96; k+4.3; na++141; liver normal albumin 3.7; phos 3.7; ferritin 202; iron 56; tibc 202   02-27-15: urine culture: multiple bacteria  05-25-15: hgb a1c 6.3 06-01-15: wbc 5.5; hgb 10.5; hct 34.1; mcv 81.8; plt 183; glucose 86; bun 34; creat 2.22; k+ 4.0; na++143; liver normal albumin 3.9; phos 3.5; ferritin 313; iron 71; tibc 126  pth 91 06-03-15: urine micro-albumin 106.0  08-25-15: wbc 7.7; hgb 12.1; hct 36.9; mcv 82.9; plt 194; glucose 140; bun 33; creat 2.16; k+ 4.0; na++1141; alt 98; albumin 3.0  09-02-15: glucose 102; bun 32; creat 2.10; k+ 4.4; na++142; liver normal albumin 3.4;ferritin 411; PTH 136; ca 9.2; hgb a1c 6.3  10-31-15: chol 93; ldl 39; trig 139; hdl 26  1-61-09: wbc 5.9; hgb 10.2; hct 32.4 mcv 82.9; plt 192; glucose 112; bun 46; create 2.43; k+ 4.4; na++144; liver normal albumin 3.5; phos 3.9; ferritin 378; iron 39; tibc 180 02-01-16: hgb a1c 7.4    Review of Systems Constitutional: Negative for malaise/fatigue.  Respiratory: Negative for cough and shortness of breath.   Cardiovascular: Negative for chest pain and palpitations.  Gastrointestinal: Negative for heartburn, abdominal pain and constipation.  Musculoskeletal: Negative for myalgias and joint pain.  Skin: right stump is inflamed   Neurological: Negative for weakness and headaches.  Psychiatric/Behavioral: The patient is not nervous/anxious.    Physical Exam Constitutional: He is oriented to person, place, and time. He appears well-developed and well-nourished. No distress.  Obese   Neck: Neck supple. No JVD present. No thyromegaly present.  Cardiovascular: Normal rate and regular rhythm.   Respiratory: Effort normal and breath sounds normal. No respiratory distress.  GI: Soft. Bowel sounds are normal. He exhibits no distension. There is no tenderness.  Musculoskeletal:  Bilateral bka;  Is able to  move upper extremities   Neurological: He is alert and oriented to person, place, and time.  Skin: Skin is warm and dry. He is not diaphoretic.  Right stump is inflamed; hot; with the redness up the stump       ASSESSMENT/ PLAN:   1. Diabetes: will continue tuojeo 70 units daily; novolog 10 units with meals for cbg >=150; tradjenta 5 mg daily;  will monitor his hgb a1c is 7.4  2. Hypertensive renal disease: will continue norvasc 10 mg daily; lopressor 100 mg twice daily; clonidine 0.2 mg patch changed weekly;  hydralazine 50 mg four times daily;   3. Dyslipidemia: will continue lipitor 80 mg daily; fish oil 1 gm daily his ldl is 39   4. 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 Diabetic retinopathy: is on systane eye drop to both eyes is followed by eye doctor   6. Osteoarthritis: is stable; will continue voltaren gel to left shoulder four times daily as needed   7. Constipation: will continue colace twice daily   8. CAD: is stable no complaint of chest pain present; will continue lopressor 100 mg twice daily    9. Edema: will continue lasix 40 mg daily   10. Right stump cellulitis: will begin zosyn 3.375 gm every 6 hours for 3 weeks.   Will check cbc; cmp in the AM       Synthia Innocent NP [facility]  Contact [phone] Monday through Friday 8am- 5pm  After hours call [phone]

## 2016-03-01 DIAGNOSIS — M25551 Pain in right hip: Secondary | ICD-10-CM | POA: Diagnosis not present

## 2016-03-21 ENCOUNTER — Encounter: Payer: Self-pay | Admitting: Adult Health

## 2016-03-21 LAB — BASIC METABOLIC PANEL: GLUCOSE: 68 mg/dL

## 2016-03-21 NOTE — Progress Notes (Signed)
Patient ID: Marc Wheeler, male   DOB: [birth year], 75 y.o.   MRN: [id number]   Location:  [facility] Room Number: 123-A Place of Service:  SNF (31)   CODE STATUS: Full Code  No Known Allergies  Chief Complaint  Patient presents with  . Acute Visit    HPI:    Past Medical History  Diagnosis Date  . Diabetes mellitus without complication (HCC)   . Hyperlipidemia   . Hypertension   . Chronic kidney disease   . Edema   . Depression   . Diabetic retinopathy (HCC)   . Hyperlipidemia LDL goal <100 07/16/2014    Past Surgical History  Procedure Laterality Date  . Leg amputation below knee Bilateral   . Right retinal detachment repair  Social History   Social History  . Marital Status: Widowed    Spouse Name: N/A  . Number of Children: N/A  . Years of Education: N/A   Occupational History  . Not on file.   Social History Main Topics  . Smoking status: Former Games developer  . Smokeless tobacco: Not on file  . Alcohol Use: No  . Drug Use: No  . Sexual Activity: No   Other Topics Concern  . Not on file   Social History Narrative   History reviewed. No pertinent family history.    VITAL SIGNS BP 142/62 mmHg  Pulse 65  Temp(Src) 97.3 F (36.3 C) (Oral)  Resp 20  Ht  (1.27 m)  Wt 220 lb 6 oz (99.961 kg)  BMI 61.98 kg/m2  SpO2 98%  Patient's Medications  New Prescriptions   No medications on file  Previous Medications   ACETAMINOPHEN (TYLENOL) 325 MG TABLET    Take 325 mg by mouth every 4 (four) hours as needed for pain.    ALUM HYDROXIDE-MAG TRISILICATE (GAVISCON) 80-14.2 MG CHEW    Chew 2 tablets by mouth every 4 (four) hours as needed (for indigestion).   AMLODIPINE (NORVASC) 10 MG TABLET    Take 10 mg by mouth daily. Reported on 12/29/2015   ASPIRIN 81 MG TABLET    Take 81 mg by mouth daily.   ATORVASTATIN (LIPITOR) 80 MG TABLET    Take 80 mg by mouth daily.   B COMPLEX-VITAMIN C-FOLIC ACID (NEPHRO-VITE)  0.8 MG TABS    Take 0.8 mg by mouth at bedtime.   CALCITRIOL (ROCALTROL) 0.25 MCG CAPSULE    Take 0.25 mcg by mouth 3 (three) times a week. On Monday; Wednesday; Friday   CLONIDINE (CATAPRES - DOSED IN MG/24 HR) 0.2 MG/24HR PATCH    Place 1 patch onto the skin once a week.   DOCUSATE SODIUM (COLACE) 100 MG CAPSULE    Take 100 mg by mouth 2 (two) times daily.   FISH OIL-OMEGA-3 FATTY ACIDS 1000 MG CAPSULE    Take 1 g by mouth daily.   FUROSEMIDE (LASIX) 40 MG TABLET    Take 40 mg by mouth daily.   GUAIFENESIN (ROBITUSSIN) 100 MG/5ML SOLN    Take 10 mLs by mouth every 6 (six) hours as needed for cough or to loosen phlegm.   HYDRALAZINE (APRESOLINE) 50 MG TABLET    Take 1 tablet (50 mg total) by mouth 4 (four) times daily.   INSULIN GLARGINE (TOUJEO SOLOSTAR) 300 UNIT/ML SOPN    Inject 70 Units into the skin daily.   INSULIN LISPRO (HUMALOG KWIKPEN) 100 UNIT/ML KIWKPEN    Inject into the skin.  Per sliding scale 0-149= 0 units, 150+ = 10 units subcutaneously before meals for diabetes   LINAGLIPTIN (TRADJENTA) 5 MG TABS TABLET    Take 5 mg by mouth daily.   MAGNESIUM HYDROXIDE (MILK OF MAGNESIA) 400 MG/5ML SUSPENSION    Take 30 mLs by mouth daily as needed for mild constipation. Reported on 03/21/2016   METOPROLOL (LOPRESSOR) 100 MG TABLET    Take 100 mg by mouth 2 (two) times daily.   POLYETHYL GLYCOL-PROPYL GLYCOL (SYSTANE) 0.4-0.3 % SOLN    Apply 1 drop to eye daily as needed. Both eyes   TRIAMCINOLONE ACETONIDE EX    Apply topically daily. Apply to Right stump  Modified Medications   No medications on file  Discontinued Medications   No medications on file     SIGNIFICANT DIAGNOSTIC EXAMS  01-05-15: EKG: SR with first degree AVB   08-25-15: chest x-ray: Left lower lobe atelectasis.  No superimposed acute abnormality.    LABS REVIEWED:   02-25-15: wbc 6.2; hgb 10.7; hct 33.7; mcv 83; plt 200; glucose 128; bun 30;creat 1.96; k+4.3; na++141; liver normal albumin 3.7; phos 3.7; ferritin 202;  iron 56; tibc 202   02-27-15: urine culture: multiple bacteria  05-25-15: hgb a1c 6.3 06-01-15: wbc 5.5; hgb 10.5; hct 34.1; mcv 81.8; plt 183; glucose 86; bun 34; creat 2.22; k+ 4.0; na++143; liver normal albumin 3.9; phos 3.5; ferritin 313; iron 71; tibc 126  pth 91 06-03-15: urine micro-albumin 106.0  08-25-15: wbc 7.7; hgb 12.1; hct 36.9; mcv 82.9; plt 194; glucose 140; bun 33; creat 2.16; k+ 4.0; na++1141; alt 98; albumin 3.0  09-02-15: glucose 102; bun 32; creat 2.10; k+ 4.4; na++142; liver normal albumin 3.4;ferritin 411; PTH 136; ca 9.2; hgb a1c 6.3  10-31-15: chol 93; ldl 39; trig 139; hdl 26  4-09-813-24-17: wbc 5.9; hgb 10.2; hct 32.4 mcv 82.9; plt 192; glucose 112; bun 46; create 2.43; k+ 4.4; na++144; liver normal albumin 3.5; phos 3.9; ferritin 378; iron 39; tibc 180 02-01-16: hgb a1c 7.4    Review of Systems Constitutional: Negative for malaise/fatigue.  Respiratory: Negative for cough and shortness of breath.   Cardiovascular: Negative for chest pain and palpitations.  Gastrointestinal: Negative for heartburn, abdominal pain and constipation.  Musculoskeletal: Negative for myalgias and joint pain.  Skin: right stump is inflamed   Neurological: Negative for weakness and headaches.  Psychiatric/Behavioral: The patient is not nervous/anxious.    Physical Exam Constitutional: He is oriented to person, place, and time. He appears well-developed and well-nourished. No distress.  Obese   Neck: Neck supple. No JVD present. No thyromegaly present.  Cardiovascular: Normal rate and regular rhythm.   Respiratory: Effort normal and breath sounds normal. No respiratory distress.  GI: Soft. Bowel sounds are normal. He exhibits no distension. There is no tenderness.  Musculoskeletal:  Bilateral bka;  Is able to move upper extremities   Neurological: He is alert and oriented to person, place, and time.  Skin: Skin is warm and dry. He is not diaphoretic.  Right stump is inflamed; hot; with the  redness up the stump       ASSESSMENT/ PLAN:   1. Diabetes: will continue tuojeo 70 units daily; novolog 10 units with meals for cbg >=150; tradjenta 5 mg daily;  will monitor his hgb a1c is 7.4   2. Hypertensive renal disease: will continue norvasc 10 mg daily; lopressor 100 mg twice daily; clonidine 0.2 mg patch changed weekly;  hydralazine 50 mg four times daily;   3.  Dyslipidemia: will continue lipitor 80 mg daily; fish oil 1 gm daily his ldl is 39   4. 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 Diabetic retinopathy: is on systane eye drop to both eyes is followed by eye doctor   6. Osteoarthritis: is stable; will continue voltaren gel to left shoulder four times daily as needed   7. Constipation: will continue colace twice daily   8. CAD: is stable no complaint of chest pain present; will continue lopressor 100 mg twice daily    9. Edema: will continue lasix 40 mg daily   10. Right stump cellulitis: will begin zosyn 3.375 gm every 6 hours for 3 weeks.   Will check cbc; cmp in the AM             Synthia Innocent NP [facility]  Contact [phone] Monday through Friday 8am- 5pm  After hours call [phone]   This encounter was created in error - please disregard.

## 2016-03-22 ENCOUNTER — Encounter: Payer: Self-pay | Admitting: Adult Health

## 2016-03-22 ENCOUNTER — Non-Acute Institutional Stay (SKILLED_NURSING_FACILITY): Payer: Medicare Other | Admitting: Adult Health

## 2016-03-22 DIAGNOSIS — L03115 Cellulitis of right lower limb: Secondary | ICD-10-CM | POA: Diagnosis not present

## 2016-03-22 DIAGNOSIS — M159 Polyosteoarthritis, unspecified: Secondary | ICD-10-CM

## 2016-03-22 DIAGNOSIS — M150 Primary generalized (osteo)arthritis: Secondary | ICD-10-CM

## 2016-03-22 DIAGNOSIS — E1122 Type 2 diabetes mellitus with diabetic chronic kidney disease: Secondary | ICD-10-CM | POA: Diagnosis not present

## 2016-03-22 DIAGNOSIS — Z89511 Acquired absence of right leg below knee: Secondary | ICD-10-CM

## 2016-03-22 DIAGNOSIS — Z89512 Acquired absence of left leg below knee: Secondary | ICD-10-CM | POA: Diagnosis not present

## 2016-03-22 DIAGNOSIS — E113599 Type 2 diabetes mellitus with proliferative diabetic retinopathy without macular edema, unspecified eye: Secondary | ICD-10-CM

## 2016-03-22 DIAGNOSIS — E785 Hyperlipidemia, unspecified: Secondary | ICD-10-CM

## 2016-03-22 DIAGNOSIS — E1322 Other specified diabetes mellitus with diabetic chronic kidney disease: Secondary | ICD-10-CM

## 2016-03-22 DIAGNOSIS — I25119 Atherosclerotic heart disease of native coronary artery with unspecified angina pectoris: Secondary | ICD-10-CM

## 2016-03-22 DIAGNOSIS — I129 Hypertensive chronic kidney disease with stage 1 through stage 4 chronic kidney disease, or unspecified chronic kidney disease: Secondary | ICD-10-CM | POA: Diagnosis not present

## 2016-03-22 DIAGNOSIS — E1365 Other specified diabetes mellitus with hyperglycemia: Secondary | ICD-10-CM

## 2016-03-22 DIAGNOSIS — IMO0002 Reserved for concepts with insufficient information to code with codable children: Secondary | ICD-10-CM

## 2016-03-22 DIAGNOSIS — N183 Chronic kidney disease, stage 3 (moderate): Secondary | ICD-10-CM | POA: Diagnosis not present

## 2016-03-22 DIAGNOSIS — E1169 Type 2 diabetes mellitus with other specified complication: Secondary | ICD-10-CM

## 2016-03-22 NOTE — Progress Notes (Signed)
Patient ID: Marc Wheeler, male   DOB: Jan 23, 1941, 75 y.o.   MRN: [id number]    Location:  [facility] Room Number: 123-A Place of Service:  SNF (31)   CODE STATUS: Full Code  No Known Allergies  Chief Complaint  Patient presents with  . Medical Management of Chronic Issues    Follow up    HPI:  He is a long term resident of this facility being seen for the management of his chronic illnesses. Overall his status is stable. He has been treated for cellulitis in his right stump. The area has improved; but has not resolved; we discussed  further coarse of treatment; and have agreed for him to be seen by dermatology. There are no nursing concerns at this time.    Past Medical History  Diagnosis Date  . Diabetes mellitus without complication (HCC)   . Hyperlipidemia   . Hypertension   . Chronic kidney disease   . Edema   . Depression   . Diabetic retinopathy (HCC)   . Hyperlipidemia LDL goal <100 07/16/2014    Past Surgical History  Procedure Laterality Date  . Leg amputation below knee Bilateral   . Right retinal detachment repair  1 22 14     Social History   Social History  . Marital Status: Widowed    Spouse Name: N/A  . Number of Children: N/A  . Years of Education: N/A   Occupational History  . Not on file.   Social History Main Topics  . Smoking status: Former Games developer  . Smokeless tobacco: Not on file  . Alcohol Use: No  . Drug Use: No  . Sexual Activity: No   Other Topics Concern  . Not on file   Social History Narrative   History reviewed. No pertinent family history.    VITAL SIGNS BP 142/62 mmHg  Pulse 65  Temp(Src) 97.3 F (36.3 C) (Oral)  Resp 20  Ht 4\' 2"  (1.27 m)  Wt 220 lb 6 oz (99.961 kg)  BMI 61.98 kg/m2  SpO2 98%  Patient's Medications  New Prescriptions   No medications on file  Previous Medications   ACETAMINOPHEN (TYLENOL) 325 MG TABLET    Take 325 mg by mouth every 4 (four) hours as needed  for pain.    ALUM HYDROXIDE-MAG TRISILICATE (GAVISCON) 80-14.2 MG CHEW    Chew 2 tablets by mouth every 4 (four) hours as needed (for indigestion).   AMLODIPINE (NORVASC) 10 MG TABLET    Take 10 mg by mouth daily. Reported on 12/29/2015   ASPIRIN 81 MG TABLET    Take 81 mg by mouth daily.   ATORVASTATIN (LIPITOR) 80 MG TABLET    Take 80 mg by mouth daily.   B COMPLEX-VITAMIN C-FOLIC ACID (NEPHRO-VITE) 0.8 MG TABS    Take 0.8 mg by mouth at bedtime.   CALCITRIOL (ROCALTROL) 0.25 MCG CAPSULE    Take 0.25 mcg by mouth 3 (three) times a week. On Monday; Wednesday; Friday   CLONIDINE (CATAPRES - DOSED IN MG/24 HR) 0.2 MG/24HR PATCH    Place 1 patch onto the skin once a week.   DOCUSATE SODIUM (COLACE) 100 MG CAPSULE    Take 100 mg by mouth 2 (two) times daily.   FISH OIL-OMEGA-3 FATTY ACIDS 1000 MG CAPSULE    Take 1 g by mouth daily.   FUROSEMIDE (LASIX) 40 MG TABLET    Take 40 mg by mouth daily.   GUAIFENESIN (ROBITUSSIN) 100 MG/5ML SOLN  Take 10 mLs by mouth every 6 (six) hours as needed for cough or to loosen phlegm.   HYDRALAZINE (APRESOLINE) 50 MG TABLET    Take 1 tablet (50 mg total) by mouth 4 (four) times daily.   INSULIN GLARGINE (TOUJEO SOLOSTAR) 300 UNIT/ML SOPN    Inject 70 Units into the skin daily.   INSULIN LISPRO (HUMALOG KWIKPEN) 100 UNIT/ML KIWKPEN    Inject into the skin. Per sliding scale 0-149= 0 units, 150+ = 10 units subcutaneously before meals for diabetes   LINAGLIPTIN (TRADJENTA) 5 MG TABS TABLET    Take 5 mg by mouth daily.   MAGNESIUM HYDROXIDE (MILK OF MAGNESIA) 400 MG/5ML SUSPENSION    Take 30 mLs by mouth daily as needed for mild constipation. Reported on 03/21/2016   METOPROLOL (LOPRESSOR) 100 MG TABLET    Take 100 mg by mouth 2 (two) times daily.   POLYETHYL GLYCOL-PROPYL GLYCOL (SYSTANE) 0.4-0.3 % SOLN    Apply 1 drop to eye daily as needed. Both eyes   TRIAMCINOLONE ACETONIDE EX    Apply topically daily. Apply to Right stump  Modified Medications   No medications on  file  Discontinued Medications   No medications on file     SIGNIFICANT DIAGNOSTIC EXAMS  01-05-15: EKG: SR with first degree AVB   08-25-15: chest x-ray: Left lower lobe atelectasis.  No superimposed acute abnormality.    LABS REVIEWED:   05-25-15: hgb a1c 6.3 06-01-15: wbc 5.5; hgb 10.5; hct 34.1; mcv 81.8; plt 183; glucose 86; bun 34; creat 2.22; k+ 4.0; na++143; liver normal albumin 3.9; phos 3.5; ferritin 313; iron 71; tibc 126  pth 91 06-03-15: urine micro-albumin 106.0  08-25-15: wbc 7.7; hgb 12.1; hct 36.9; mcv 82.9; plt 194; glucose 140; bun 33; creat 2.16; k+ 4.0; na++1141; alt 98; albumin 3.0  09-02-15: glucose 102; bun 32; creat 2.10; k+ 4.4; na++142; liver normal albumin 3.4;ferritin 411; PTH 136; ca 9.2; hgb a1c 6.3  10-31-15: chol 93; ldl 39; trig 139; hdl 26  8-11-913-24-17: wbc 5.9; hgb 10.2; hct 32.4 mcv 82.9; plt 192; glucose 112; bun 46; create 2.43; k+ 4.4; na++144; liver normal albumin 3.5; phos 3.9; ferritin 378; iron 39; tibc 180 02-01-16: hgb a1c 7.4    Review of Systems Constitutional: Negative for malaise/fatigue.  Respiratory: Negative for cough and shortness of breath.   Cardiovascular: Negative for chest pain and palpitations.  Gastrointestinal: Negative for heartburn, abdominal pain and constipation.  Musculoskeletal: Negative for myalgias and joint pain.  Skin: right stump is improving   Neurological: Negative for weakness and headaches.  Psychiatric/Behavioral: The patient is not nervous/anxious.    Physical Exam Constitutional: He is oriented to person, place, and time. He appears well-developed and well-nourished. No distress.  Obese   Neck: Neck supple. No JVD present. No thyromegaly present.  Cardiovascular: Normal rate and regular rhythm.   Respiratory: Effort normal and breath sounds normal. No respiratory distress.  GI: Soft. Bowel sounds are normal. He exhibits no distension. There is no tenderness.  Musculoskeletal:  Bilateral bka;  Is able to  move upper extremities   Neurological: He is alert and oriented to person, place, and time.  Skin: Skin is warm and dry. He is not diaphoretic.  Right stump is  Red with rash        ASSESSMENT/ PLAN:  1. Diabetes: will continue tuojeo 70 units daily; novolog 10 units with meals for cbg >=150; tradjenta 5 mg daily;  will monitor his hgb a1c is 7.4  2. Hypertensive renal disease: will continue norvasc 10 mg daily; lopressor 100 mg twice daily; clonidine 0.2 mg patch changed weekly;  hydralazine 50 mg four times daily;   3. Dyslipidemia: will continue lipitor 80 mg daily; fish oil 1 gm daily his ldl is 39   4. 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 Diabetic retinopathy: is on systane eye drop to both eyes is followed by eye doctor   6. Osteoarthritis: is stable; will continue voltaren gel to left shoulder four times daily as needed   7. Constipation: will continue colace twice daily   8. CAD: is stable no complaint of chest pain present; will continue lopressor 100 mg twice daily    9. Edema: will continue lasix 40 mg daily   10. Right stump cellulitis: resolved; appears to be a dermatitis; will setup a dermatology consult. Will use ace wrap to help control edema; and will keep picc line in until seen by dermatology      Synthia Innocent NP [facility]  Contact [phone] Monday through Friday 8am- 5pm  After hours call [phone]

## 2016-04-06 DIAGNOSIS — I872 Venous insufficiency (chronic) (peripheral): Secondary | ICD-10-CM | POA: Diagnosis not present

## 2016-04-27 ENCOUNTER — Encounter: Payer: Self-pay | Admitting: Adult Health

## 2016-04-27 ENCOUNTER — Non-Acute Institutional Stay (SKILLED_NURSING_FACILITY): Payer: Medicare Other | Admitting: Adult Health

## 2016-04-27 DIAGNOSIS — N183 Chronic kidney disease, stage 3 (moderate): Secondary | ICD-10-CM | POA: Diagnosis not present

## 2016-04-27 DIAGNOSIS — E1365 Other specified diabetes mellitus with hyperglycemia: Secondary | ICD-10-CM

## 2016-04-27 DIAGNOSIS — M19012 Primary osteoarthritis, left shoulder: Secondary | ICD-10-CM

## 2016-04-27 DIAGNOSIS — E1169 Type 2 diabetes mellitus with other specified complication: Secondary | ICD-10-CM

## 2016-04-27 DIAGNOSIS — E1322 Other specified diabetes mellitus with diabetic chronic kidney disease: Secondary | ICD-10-CM | POA: Diagnosis not present

## 2016-04-27 DIAGNOSIS — Z89512 Acquired absence of left leg below knee: Secondary | ICD-10-CM

## 2016-04-27 DIAGNOSIS — IMO0002 Reserved for concepts with insufficient information to code with codable children: Secondary | ICD-10-CM

## 2016-04-27 DIAGNOSIS — I129 Hypertensive chronic kidney disease with stage 1 through stage 4 chronic kidney disease, or unspecified chronic kidney disease: Secondary | ICD-10-CM

## 2016-04-27 DIAGNOSIS — E785 Hyperlipidemia, unspecified: Secondary | ICD-10-CM

## 2016-04-27 DIAGNOSIS — E1122 Type 2 diabetes mellitus with diabetic chronic kidney disease: Secondary | ICD-10-CM

## 2016-04-27 DIAGNOSIS — Z89511 Acquired absence of right leg below knee: Secondary | ICD-10-CM

## 2016-04-27 NOTE — Progress Notes (Signed)
Patient ID: Marc Wheeler, male   DOB: 06/24/1941, 75 y.o.   MRN: [id number]   [facility]   [facility] Room Number: 123-A Place of Service:  SNF (31)   CODE STATUS: Full Code  No Known Allergies  Chief Complaint  Patient presents with  . Medical Management of Chronic Issues    Follow up    HPI:  He is a long term resident of this facility being seen for the management of his chronic illnesses. He tells me that he has a rash on bilateral stumps and arms. He has been seen by dermatology in June for his rash. This rash is different. There are no nursing concerns at this time.    Past Medical History  Diagnosis Date  . Diabetes mellitus without complication (HCC)   . Hyperlipidemia   . Hypertension   . Chronic kidney disease   . Edema   . Depression   . Diabetic retinopathy (HCC)   . Hyperlipidemia LDL goal <100 07/16/2014    Past Surgical History  Procedure Laterality Date  . Leg amputation below knee Bilateral   . Right retinal detachment repair  1 22 14     Social History   Social History  . Marital Status: Widowed    Spouse Name: N/A  . Number of Children: N/A  . Years of Education: N/A   Occupational History  . Not on file.   Social History Main Topics  . Smoking status: Former Games developermoker  . Smokeless tobacco: Not on file  . Alcohol Use: No  . Drug Use: No  . Sexual Activity: No   Other Topics Concern  . Not on file   Social History Narrative   History reviewed. No pertinent family history.    VITAL SIGNS BP 129/67 mmHg  Pulse 64  Temp(Src) 98.1 F (36.7 C) (Oral)  Resp 18  Ht 4\' 2"  (1.27 m)  Wt 222 lb (100.699 kg)  BMI 62.43 kg/m2  SpO2 98%  Patient's Medications  New Prescriptions   No medications on file  Previous Medications   ACETAMINOPHEN (TYLENOL) 325 MG TABLET    Take 325 mg by mouth every 4 (four) hours as needed for pain.    ALUM HYDROXIDE-MAG TRISILICATE (GAVISCON) 80-14.2 MG CHEW    Chew 2 tablets by mouth every 4  (four) hours as needed (for indigestion).   AMLODIPINE (NORVASC) 10 MG TABLET    Take 10 mg by mouth daily. Reported on 12/29/2015   ASPIRIN 81 MG TABLET    Take 81 mg by mouth daily.   ATORVASTATIN (LIPITOR) 80 MG TABLET    Take 80 mg by mouth daily.   B COMPLEX-VITAMIN C-FOLIC ACID (NEPHRO-VITE) 0.8 MG TABS    Take 0.8 mg by mouth at bedtime.   CALCITRIOL (ROCALTROL) 0.25 MCG CAPSULE    Take 0.25 mcg by mouth 3 (three) times a week. On Monday; Wednesday; Friday   CLOBETASOL CREAM (TEMOVATE) 0.05 %    Apply 1 application topically 2 (two) times daily.   CLONIDINE (CATAPRES - DOSED IN MG/24 HR) 0.2 MG/24HR PATCH    Place 1 patch onto the skin once a week.   DOCUSATE SODIUM (COLACE) 100 MG CAPSULE    Take 100 mg by mouth 2 (two) times daily.   FISH OIL-OMEGA-3 FATTY ACIDS 1000 MG CAPSULE    Take 1 g by mouth daily.   FUROSEMIDE (LASIX) 40 MG TABLET    Take 40 mg by mouth daily.   GUAIFENESIN (ROBITUSSIN) 100 MG/5ML  SOLN    Take 10 mLs by mouth every 6 (six) hours as needed for cough or to loosen phlegm.   HYDRALAZINE (APRESOLINE) 50 MG TABLET    Take 1 tablet (50 mg total) by mouth 4 (four) times daily.   INSULIN GLARGINE (TOUJEO SOLOSTAR) 300 UNIT/ML SOPN    Inject 70 Units into the skin daily.   INSULIN LISPRO (HUMALOG KWIKPEN) 100 UNIT/ML KIWKPEN    Inject into the skin. Per sliding scale 0-149= 0 units, 150+ = 10 units subcutaneously before meals for diabetes   LINAGLIPTIN (TRADJENTA) 5 MG TABS TABLET    Take 5 mg by mouth daily.   METOPROLOL (LOPRESSOR) 100 MG TABLET    Take 100 mg by mouth 2 (two) times daily.   POLYETHYL GLYCOL-PROPYL GLYCOL (SYSTANE) 0.4-0.3 % SOLN    Apply 1 drop to eye daily as needed. Both eyes  Modified Medications   No medications on file  Discontinued Medications   MAGNESIUM HYDROXIDE (MILK OF MAGNESIA) 400 MG/5ML SUSPENSION    Take 30 mLs by mouth daily as needed for mild constipation. Reported on 04/27/2016   TRIAMCINOLONE ACETONIDE EX    Apply topically daily.  Reported on 04/27/2016     SIGNIFICANT DIAGNOSTIC EXAMS  01-05-15: EKG: SR with first degree AVB   08-25-15: chest x-ray: Left lower lobe atelectasis.  No superimposed acute abnormality.    LABS REVIEWED:   05-25-15: hgb a1c 6.3 06-01-15: wbc 5.5; hgb 10.5; hct 34.1; mcv 81.8; plt 183; glucose 86; bun 34; creat 2.22; k+ 4.0; na++143; liver normal albumin 3.9; phos 3.5; ferritin 313; iron 71; tibc 126  pth 91 06-03-15: urine micro-albumin 106.0  08-25-15: wbc 7.7; hgb 12.1; hct 36.9; mcv 82.9; plt 194; glucose 140; bun 33; creat 2.16; k+ 4.0; na++1141; alt 98; albumin 3.0  09-02-15: glucose 102; bun 32; creat 2.10; k+ 4.4; na++142; liver normal albumin 3.4;ferritin 411; PTH 136; ca 9.2; hgb a1c 6.3  10-31-15: chol 93; ldl 39; trig 139; hdl 26  9-52-84: wbc 5.9; hgb 10.2; hct 32.4 mcv 82.9; plt 192; glucose 112; bun 46; create 2.43; k+ 4.4; na++144; liver normal albumin 3.5; phos 3.9; ferritin 378; iron 39; tibc 180 02-01-16: hgb a1c 7.4    Review of Systems Constitutional: Negative for malaise/fatigue.  Respiratory: Negative for cough and shortness of breath.   Cardiovascular: Negative for chest pain and palpitations.  Gastrointestinal: Negative for heartburn, abdominal pain and constipation.  Musculoskeletal: Negative for myalgias and joint pain.  Skin: has rash   Neurological: Negative for weakness and headaches.  Psychiatric/Behavioral: The patient is not nervous/anxious.    Physical Exam Constitutional: He is oriented to person, place, and time. He appears well-developed and well-nourished. No distress.  Obese   Neck: Neck supple. No JVD present. No thyromegaly present.  Cardiovascular: Normal rate and regular rhythm.   Respiratory: Effort normal and breath sounds normal. No respiratory distress.  GI: Soft. Bowel sounds are normal. He exhibits no distension. There is no tenderness.  Musculoskeletal:  Bilateral bka;  Is able to move upper extremities   Neurological: He is alert  and oriented to person, place, and time.  Skin: Skin is warm and dry. He is not diaphoretic.  Has red inflamed rash on bilateral stumps and arms.         ASSESSMENT/ PLAN:  1. Diabetes: will continue tuojeo 70 units daily; novolog 10 units with meals for cbg >=150; tradjenta 5 mg daily;  will monitor his hgb a1c is 7.4   2.  Hypertensive renal disease: will continue norvasc 10 mg daily; lopressor 100 mg twice daily; clonidine 0.2 mg patch changed weekly;  hydralazine 50 mg four times daily;   3. Dyslipidemia: will continue lipitor 80 mg daily; fish oil 1 gm daily his ldl is 39   4. 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 Diabetic retinopathy: is on systane eye drop to both eyes is followed by eye doctor   6. Osteoarthritis: is stable; will continue voltaren gel to left shoulder four times daily as needed   7. Constipation: will continue colace twice daily   8. CAD: is stable no complaint of chest pain present; will continue lopressor 100 mg twice daily    9. Edema: will continue lasix 40 mg daily   10. Rash: will have him return back to dermatology will monitor   Will check hgb a1c and lipids        Synthia Innocenteborah Green NP [facility]  Contact [phone] Monday through Friday 8am- 5pm  After hours call [phone]

## 2016-04-30 DIAGNOSIS — E118 Type 2 diabetes mellitus with unspecified complications: Secondary | ICD-10-CM | POA: Diagnosis not present

## 2016-04-30 LAB — HEMOGLOBIN A1C: HEMOGLOBIN A1C: 6.5

## 2016-05-01 DIAGNOSIS — E113553 Type 2 diabetes mellitus with stable proliferative diabetic retinopathy, bilateral: Secondary | ICD-10-CM | POA: Diagnosis not present

## 2016-05-01 DIAGNOSIS — Z961 Presence of intraocular lens: Secondary | ICD-10-CM | POA: Diagnosis not present

## 2016-05-01 DIAGNOSIS — H04123 Dry eye syndrome of bilateral lacrimal glands: Secondary | ICD-10-CM | POA: Diagnosis not present

## 2016-05-08 DIAGNOSIS — B86 Scabies: Secondary | ICD-10-CM | POA: Diagnosis not present

## 2016-05-28 ENCOUNTER — Encounter: Payer: Self-pay | Admitting: Adult Health

## 2016-05-28 ENCOUNTER — Non-Acute Institutional Stay (SKILLED_NURSING_FACILITY): Payer: Medicare Other | Admitting: Adult Health

## 2016-05-28 DIAGNOSIS — N183 Chronic kidney disease, stage 3 (moderate): Secondary | ICD-10-CM

## 2016-05-28 DIAGNOSIS — I25119 Atherosclerotic heart disease of native coronary artery with unspecified angina pectoris: Secondary | ICD-10-CM | POA: Diagnosis not present

## 2016-05-28 DIAGNOSIS — I129 Hypertensive chronic kidney disease with stage 1 through stage 4 chronic kidney disease, or unspecified chronic kidney disease: Secondary | ICD-10-CM

## 2016-05-28 DIAGNOSIS — M150 Primary generalized (osteo)arthritis: Secondary | ICD-10-CM

## 2016-05-28 DIAGNOSIS — E1169 Type 2 diabetes mellitus with other specified complication: Secondary | ICD-10-CM

## 2016-05-28 DIAGNOSIS — R600 Localized edema: Secondary | ICD-10-CM | POA: Diagnosis not present

## 2016-05-28 DIAGNOSIS — E1122 Type 2 diabetes mellitus with diabetic chronic kidney disease: Secondary | ICD-10-CM | POA: Diagnosis not present

## 2016-05-28 DIAGNOSIS — E785 Hyperlipidemia, unspecified: Secondary | ICD-10-CM

## 2016-05-28 DIAGNOSIS — M159 Polyosteoarthritis, unspecified: Secondary | ICD-10-CM

## 2016-05-28 NOTE — Progress Notes (Signed)
Patient ID: Marc Wheeler, male   DOB: 06/17/1941, 75 y.o.   MRN: [id number]   Location:   [facility] Room Number: 123-A Place of Service:  SNF (31)   CODE STATUS: Full Code No Known Allergies  Chief Complaint  Patient presents with  . Medical Management of Chronic Issues    Follow up    HPI:    Past Medical History:  Diagnosis Date  . Chronic kidney disease   . Depression   . Diabetes mellitus without complication (HCC)   . Diabetic retinopathy (HCC)   . Edema   . Hyperlipidemia   . Hyperlipidemia LDL goal <100 07/16/2014  . Hypertension     Past Surgical History:  Procedure Laterality Date  . LEG AMPUTATION BELOW KNEE Bilateral   . right retinal detachment repair  1 22 14     Social History   Social History  . Marital status: Widowed    Spouse name: N/A  . Number of children: N/A  . Years of education: N/A   Occupational History  . Not on file.   Social History Main Topics  . Smoking status: Former Games developermoker  . Smokeless tobacco: Not on file  . Alcohol use No  . Drug use: No  . Sexual activity: No   Other Topics Concern  . Not on file   Social History Narrative  . No narrative on file   History reviewed. No pertinent family history.    VITAL SIGNS BP (!) 155/82   Pulse 76   Temp 98.1 F (36.7 C) (Oral)   Resp 18   Ht 4\' 2"  (1.27 m)   Wt 223 lb 4 oz (101.3 kg)   SpO2 97%   BMI 62.78 kg/m   Patient's Medications  New Prescriptions   No medications on file  Previous Medications   ACETAMINOPHEN (TYLENOL) 325 MG TABLET    Take 325 mg by mouth every 4 (four) hours as needed for pain.    ALUM HYDROXIDE-MAG TRISILICATE (GAVISCON) 80-14.2 MG CHEW    Chew 2 tablets by mouth every 4 (four) hours as needed (for indigestion).   AMLODIPINE (NORVASC) 10 MG TABLET    Take 10 mg by mouth daily. Reported on 12/29/2015   ASPIRIN 81 MG TABLET    Take 81 mg by mouth daily.   ATORVASTATIN (LIPITOR) 80 MG TABLET    Take 80 mg by mouth daily.   B  COMPLEX-VITAMIN C-FOLIC ACID (NEPHRO-VITE) 0.8 MG TABS    Take 0.8 mg by mouth at bedtime.   CALCITRIOL (ROCALTROL) 0.25 MCG CAPSULE    Take 0.25 mcg by mouth 3 (three) times a week. On Monday; Wednesday; Friday   CLOBETASOL CREAM (TEMOVATE) 0.05 %    Apply 1 application topically 2 (two) times daily.   CLONIDINE (CATAPRES - DOSED IN MG/24 HR) 0.2 MG/24HR PATCH    Place 1 patch onto the skin once a week.   DOCUSATE SODIUM (COLACE) 100 MG CAPSULE    Take 100 mg by mouth 2 (two) times daily.   FISH OIL-OMEGA-3 FATTY ACIDS 1000 MG CAPSULE    Take 1 g by mouth daily.   FUROSEMIDE (LASIX) 40 MG TABLET    Take 40 mg by mouth daily.   GUAIFENESIN (ROBITUSSIN) 100 MG/5ML SOLN    Take 10 mLs by mouth every 6 (six) hours as needed for cough or to loosen phlegm.   HYDRALAZINE (APRESOLINE) 50 MG TABLET    Take 1 tablet (50 mg total) by mouth 4 (  four) times daily.   INSULIN GLARGINE (TOUJEO SOLOSTAR) 300 UNIT/ML SOPN    Inject 70 Units into the skin daily.   INSULIN LISPRO (HUMALOG KWIKPEN) 100 UNIT/ML KIWKPEN    Inject into the skin. Per sliding scale 0-149= 0 units, 150+ = 10 units subcutaneously before meals for diabetes   LINAGLIPTIN (TRADJENTA) 5 MG TABS TABLET    Take 5 mg by mouth daily.   METOPROLOL (LOPRESSOR) 100 MG TABLET    Take 100 mg by mouth 2 (two) times daily.   POLYETHYL GLYCOL-PROPYL GLYCOL (SYSTANE) 0.4-0.3 % SOLN    Apply 1 drop to eye daily as needed. Both eyes   TACROLIMUS (PROTOPIC) 0.1 % OINTMENT    Apply topically every 8 (eight) hours as needed.  Modified Medications   No medications on file  Discontinued Medications   No medications on file     SIGNIFICANT DIAGNOSTIC EXAMS  01-05-15: EKG: SR with first degree AVB   08-25-15: chest x-ray: Left lower lobe atelectasis.  No superimposed acute abnormality.    LABS REVIEWED:   05-25-15: hgb a1c 6.3 06-01-15: wbc 5.5; hgb 10.5; hct 34.1; mcv 81.8; plt 183; glucose 86; bun 34; creat 2.22; k+ 4.0; na++143; liver normal albumin 3.9;  phos 3.5; ferritin 313; iron 71; tibc 126  pth 91 06-03-15: urine micro-albumin 106.0  08-25-15: wbc 7.7; hgb 12.1; hct 36.9; mcv 82.9; plt 194; glucose 140; bun 33; creat 2.16; k+ 4.0; na++1141; alt 98; albumin 3.0  09-02-15: glucose 102; bun 32; creat 2.10; k+ 4.4; na++142; liver normal albumin 3.4;ferritin 411; PTH 136; ca 9.2; hgb a1c 6.3  10-31-15: chol 93; ldl 39; trig 139; hdl 26  06-20-55: wbc 5.9; hgb 10.2; hct 32.4 mcv 82.9; plt 192; glucose 112; bun 46; create 2.43; k+ 4.4; na++144; liver normal albumin 3.5; phos 3.9; ferritin 378; iron 39; tibc 180 02-01-16: hgb a1c 7.4  04-30-16: hgb a1c 6.5    Review of Systems Constitutional: Negative for malaise/fatigue.  Respiratory: Negative for cough and shortness of breath.   Cardiovascular: Negative for chest pain and palpitations.  Gastrointestinal: Negative for heartburn, abdominal pain and constipation.  Musculoskeletal: Negative for myalgias and joint pain.  Skin: has rash   Neurological: Negative for weakness and headaches.  Psychiatric/Behavioral: The patient is not nervous/anxious.    Physical Exam Constitutional: He is oriented to person, place, and time. He appears well-developed and well-nourished. No distress.  Obese   Neck: Neck supple. No JVD present. No thyromegaly present.  Cardiovascular: Normal rate and regular rhythm.   Respiratory: Effort normal and breath sounds normal. No respiratory distress.  GI: Soft. Bowel sounds are normal. He exhibits no distension. There is no tenderness.  Musculoskeletal:  Bilateral bka;  Is able to move upper extremities   Neurological: He is alert and oriented to person, place, and time.  Skin: Skin is warm and dry. He is not diaphoretic.           ASSESSMENT/ PLAN:  1. Diabetes: will continue tuojeo 70 units daily; novolog 10 units with meals for cbg >=150; tradjenta 5 mg daily;  will monitor his hgb a1c is 6.5  2. Hypertensive renal disease: will continue norvasc 10 mg daily;  lopressor 100 mg twice daily; clonidine 0.2 mg patch changed weekly;  hydralazine 50 mg four times daily;   3. Dyslipidemia: will continue lipitor 80 mg daily; fish oil 1 gm daily his ldl is 39   4. 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 Diabetic retinopathy: is on systane eye drop to both eyes is followed by eye doctor   6. Osteoarthritis: is stable; is presently not on medications; will continue to monitor her status.    7. Constipation: will continue colace twice daily   8. CAD: is stable no complaint of chest pain present; will continue lopressor 100 mg twice daily    9. Edema: will continue lasix 40 mg daily     Synthia Innocent NP [facility]  Contact [phone] Monday through Friday 8am- 5pm  After hours call [phone]

## 2016-06-18 DIAGNOSIS — E118 Type 2 diabetes mellitus with unspecified complications: Secondary | ICD-10-CM | POA: Diagnosis not present

## 2016-06-18 LAB — BASIC METABOLIC PANEL
BUN: 34 mg/dL — AB (ref 4–21)
Creatinine: 2.2 mg/dL — AB (ref 0.6–1.3)
Glucose: 101 mg/dL
Potassium: 4.4 mmol/L (ref 3.4–5.3)
SODIUM: 142 mmol/L (ref 137–147)

## 2016-06-29 ENCOUNTER — Encounter: Payer: Self-pay | Admitting: Adult Health

## 2016-06-29 ENCOUNTER — Non-Acute Institutional Stay (SKILLED_NURSING_FACILITY): Payer: Medicare Other | Admitting: Adult Health

## 2016-06-29 DIAGNOSIS — I129 Hypertensive chronic kidney disease with stage 1 through stage 4 chronic kidney disease, or unspecified chronic kidney disease: Secondary | ICD-10-CM | POA: Diagnosis not present

## 2016-06-29 DIAGNOSIS — E1169 Type 2 diabetes mellitus with other specified complication: Secondary | ICD-10-CM

## 2016-06-29 DIAGNOSIS — I25119 Atherosclerotic heart disease of native coronary artery with unspecified angina pectoris: Secondary | ICD-10-CM | POA: Diagnosis not present

## 2016-06-29 DIAGNOSIS — E1121 Type 2 diabetes mellitus with diabetic nephropathy: Secondary | ICD-10-CM | POA: Diagnosis not present

## 2016-06-29 DIAGNOSIS — M150 Primary generalized (osteo)arthritis: Secondary | ICD-10-CM

## 2016-06-29 DIAGNOSIS — R600 Localized edema: Secondary | ICD-10-CM

## 2016-06-29 DIAGNOSIS — E785 Hyperlipidemia, unspecified: Secondary | ICD-10-CM

## 2016-06-29 DIAGNOSIS — M159 Polyosteoarthritis, unspecified: Secondary | ICD-10-CM

## 2016-06-29 DIAGNOSIS — I209 Angina pectoris, unspecified: Secondary | ICD-10-CM | POA: Diagnosis not present

## 2016-06-29 NOTE — Progress Notes (Signed)
Patient ID: Marc Wheeler, male   DOB: 08/03/1941, 75 y.o.   MRN: [id number]    Location:   [facility] Room Number: 123-A Place of Service:  SNF (31)   CODE STATUS: Full Code  No Known Allergies  Chief Complaint  Patient presents with  . Medical Management of Chronic Issues    Follow up    HPI:  He is a long term resident of this facility being seen for the management of his chronic illnesses. Overall his status is stable. He is not voicing any complaints stating that he is feeling good there are no nursing concerns at this time.    Past Medical History:  Diagnosis Date  . Chronic kidney disease   . Depression   . Diabetes mellitus without complication (HCC)   . Diabetic retinopathy (HCC)   . Edema   . Hyperlipidemia   . Hyperlipidemia LDL goal <100 07/16/2014  . Hypertension     Past Surgical History:  Procedure Laterality Date  . LEG AMPUTATION BELOW KNEE Bilateral   . right retinal detachment repair  1 22 14     Social History   Social History  . Marital status: Widowed    Spouse name: N/A  . Number of children: N/A  . Years of education: N/A   Occupational History  . Not on file.   Social History Main Topics  . Smoking status: Former Games developer  . Smokeless tobacco: Not on file  . Alcohol use No  . Drug use: No  . Sexual activity: No   Other Topics Concern  . Not on file   Social History Narrative  . No narrative on file   History reviewed. No pertinent family history.    VITAL SIGNS BP (!) 142/68   Pulse 81   Temp 98 F (36.7 C) (Oral)   Resp 18   Ht 4\' 2"  (1.27 m)   Wt 198 lb 6 oz (90 kg)   SpO2 99%   BMI 55.79 kg/m   Patient's Medications  New Prescriptions   No medications on file  Previous Medications   ACETAMINOPHEN (TYLENOL) 325 MG TABLET    Take 325 mg by mouth every 4 (four) hours as needed for pain.    ALUM HYDROXIDE-MAG TRISILICATE (GAVISCON) 80-14.2 MG CHEW    Chew 2 tablets by mouth every 4 (four) hours as  needed (for indigestion).   AMLODIPINE (NORVASC) 10 MG TABLET    Take 10 mg by mouth daily. Reported on 12/29/2015   ASPIRIN 81 MG TABLET    Take 81 mg by mouth daily.   ATORVASTATIN (LIPITOR) 80 MG TABLET    Take 80 mg by mouth daily.   B COMPLEX-VITAMIN C-FOLIC ACID (NEPHRO-VITE) 0.8 MG TABS    Take 0.8 mg by mouth at bedtime.   CALCITRIOL (ROCALTROL) 0.25 MCG CAPSULE    Take 0.25 mcg by mouth 3 (three) times a week. On Monday; Wednesday; Friday   CLONIDINE (CATAPRES - DOSED IN MG/24 HR) 0.2 MG/24HR PATCH    Place 1 patch onto the skin once a week.   DOCUSATE SODIUM (COLACE) 100 MG CAPSULE    Take 100 mg by mouth 2 (two) times daily.   FISH OIL-OMEGA-3 FATTY ACIDS 1000 MG CAPSULE    Take 1 g by mouth daily.   GUAIFENESIN (ROBITUSSIN) 100 MG/5ML SOLN    Take 10 mLs by mouth every 6 (six) hours as needed for cough or to loosen phlegm.   HYDRALAZINE (APRESOLINE) 50 MG TABLET  Take 1 tablet (50 mg total) by mouth 4 (four) times daily.   INSULIN GLARGINE (TOUJEO SOLOSTAR) 300 UNIT/ML SOPN    Inject 70 Units into the skin daily.   INSULIN LISPRO (HUMALOG KWIKPEN) 100 UNIT/ML KIWKPEN    Inject into the skin. Per sliding scale 0-149= 0 units, 150+ = 10 units subcutaneously before meals for diabetes   LINAGLIPTIN (TRADJENTA) 5 MG TABS TABLET    Take 5 mg by mouth daily.   METOPROLOL (LOPRESSOR) 100 MG TABLET    Take 100 mg by mouth 2 (two) times daily.   POLYETHYL GLYCOL-PROPYL GLYCOL (SYSTANE) 0.4-0.3 % SOLN    Apply 1 drop to eye daily as needed. Both eyes   TACROLIMUS (PROTOPIC) 0.1 % OINTMENT    Apply topically every 8 (eight) hours as needed.   TORSEMIDE (DEMADEX) 20 MG TABLET    Take 20 mg by mouth daily.  Modified Medications   No medications on file  Discontinued Medications   CLOBETASOL CREAM (TEMOVATE) 0.05 %    Apply 1 application topically 2 (two) times daily.   FUROSEMIDE (LASIX) 40 MG TABLET    Take 40 mg by mouth daily.     SIGNIFICANT DIAGNOSTIC EXAMS  01-05-15: EKG: SR with first  degree AVB   08-25-15: chest x-ray: Left lower lobe atelectasis.  No superimposed acute abnormality.    LABS REVIEWED:   08-25-15: wbc 7.7; hgb 12.1; hct 36.9; mcv 82.9; plt 194; glucose 140; bun 33; creat 2.16; k+ 4.0; na++1141; alt 98; albumin 3.0  09-02-15: glucose 102; bun 32; creat 2.10; k+ 4.4; na++142; liver normal albumin 3.4;ferritin 411; PTH 136; ca 9.2; hgb a1c 6.3  10-31-15: chol 93; ldl 39; trig 139; hdl 26  1-61-09: wbc 5.9; hgb 10.2; hct 32.4 mcv 82.9; plt 192; glucose 112; bun 46; create 2.43; k+ 4.4; na++144; liver normal albumin 3.5; phos 3.9; ferritin 378; iron 39; tibc 180 02-01-16: hgb a1c 7.4  04-30-16: hgb a1c 6.5  06-18-16: glucose 101; bun 24; creat 2.24; k+ 4.4; na++ 142    Review of Systems Constitutional: Negative for malaise/fatigue.  Respiratory: Negative for cough and shortness of breath.   Cardiovascular: Negative for chest pain and palpitations.  Gastrointestinal: Negative for heartburn, abdominal pain and constipation.  Musculoskeletal: Negative for myalgias and joint pain.  Skin:no concerns  Neurological: Negative for weakness and headaches.  Psychiatric/Behavioral: The patient is not nervous/anxious.    Physical Exam Constitutional: He is oriented to person, place, and time. He appears well-developed and well-nourished. No distress.  Obese   Neck: Neck supple. No JVD present. No thyromegaly present.  Cardiovascular: Normal rate and regular rhythm.   Respiratory: Effort normal and breath sounds normal. No respiratory distress.  GI: Soft. Bowel sounds are normal. He exhibits no distension. There is no tenderness.  Musculoskeletal:  Bilateral bka;  Is able to move upper extremities   Neurological: He is alert and oriented to person, place, and time.  Skin: Skin is warm and dry. He is not diaphoretic.           ASSESSMENT/ PLAN:  1. Diabetes: will continue tuojeo 70 units daily; novolog 10 units with meals for cbg >=150; tradjenta 5 mg daily;   will monitor his hgb a1c is 6.5  2. Hypertensive renal disease: will continue norvasc 10 mg daily; lopressor 100 mg twice daily; clonidine 0.2 mg patch changed weekly;  hydralazine 50 mg four times daily;   3. Dyslipidemia: will continue lipitor 80 mg daily; fish oil 1 gm daily  his ldl is 39   4. 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 Diabetic retinopathy: is on systane eye drop to both eyes is followed by eye doctor   6. Osteoarthritis: is stable; is presently not on medications; will continue to monitor her status.    7. Constipation: will continue colace twice daily   8. CAD: is stable no complaint of chest pain present; will continue lopressor 100 mg twice daily    9. Edema: will continue demadex 20 mg daily     Will check cbc; cmp lipids and urine micro-albumin   Synthia Innocenteborah Jaquon Gingerich NP [facility]  Contact [phone] Monday through Friday 8am- 5pm  After hours call [phone]

## 2016-07-02 DIAGNOSIS — E785 Hyperlipidemia, unspecified: Secondary | ICD-10-CM | POA: Diagnosis not present

## 2016-07-02 DIAGNOSIS — E118 Type 2 diabetes mellitus with unspecified complications: Secondary | ICD-10-CM | POA: Diagnosis not present

## 2016-07-02 LAB — HEPATIC FUNCTION PANEL
ALT: 19 U/L (ref 10–40)
AST: 15 U/L (ref 14–40)
Alkaline Phosphatase: 83 U/L (ref 25–125)
Bilirubin, Total: 0.3 mg/dL

## 2016-07-02 LAB — BASIC METABOLIC PANEL
BUN: 34 mg/dL — AB (ref 4–21)
Creatinine: 2.3 mg/dL — AB (ref 0.6–1.3)
GLUCOSE: 83 mg/dL
Potassium: 4.7 mmol/L (ref 3.4–5.3)
SODIUM: 143 mmol/L (ref 137–147)

## 2016-07-02 LAB — LIPID PANEL
Cholesterol: 88 mg/dL (ref 0–200)
HDL: 25 mg/dL — AB (ref 35–70)
LDL CALC: 36 mg/dL
Triglycerides: 136 mg/dL (ref 40–160)

## 2016-07-02 LAB — CBC AND DIFFERENTIAL
HEMATOCRIT: 33 % — AB (ref 41–53)
HEMOGLOBIN: 10.4 g/dL — AB (ref 13.5–17.5)
PLATELETS: 223 10*3/uL (ref 150–399)
WBC: 7.9 10*3/mL

## 2016-07-16 DIAGNOSIS — E1121 Type 2 diabetes mellitus with diabetic nephropathy: Secondary | ICD-10-CM | POA: Insufficient documentation

## 2016-07-17 ENCOUNTER — Encounter: Payer: Self-pay | Admitting: Internal Medicine

## 2016-07-17 ENCOUNTER — Non-Acute Institutional Stay (SKILLED_NURSING_FACILITY): Payer: Medicare Other | Admitting: Internal Medicine

## 2016-07-17 DIAGNOSIS — Z89511 Acquired absence of right leg below knee: Secondary | ICD-10-CM | POA: Diagnosis not present

## 2016-07-17 DIAGNOSIS — Z794 Long term (current) use of insulin: Secondary | ICD-10-CM

## 2016-07-17 DIAGNOSIS — Z89512 Acquired absence of left leg below knee: Secondary | ICD-10-CM | POA: Diagnosis not present

## 2016-07-17 DIAGNOSIS — R21 Rash and other nonspecific skin eruption: Secondary | ICD-10-CM | POA: Diagnosis not present

## 2016-07-17 DIAGNOSIS — N183 Chronic kidney disease, stage 3 (moderate): Secondary | ICD-10-CM | POA: Diagnosis not present

## 2016-07-17 DIAGNOSIS — E1122 Type 2 diabetes mellitus with diabetic chronic kidney disease: Secondary | ICD-10-CM

## 2016-07-17 NOTE — Progress Notes (Signed)
Patient ID: Marc Wheeler, male   DOB: 10/21/1941, 75 y.o.   MRN: [id number]    DATE:  07/17/2016  Location:    [facility] Room Number: 123 A Place of Service: SNF (31)   Extended Emergency Contact Information Primary Emergency Contact: Obriant,Brandell Lyn HenriWesley  [place] Home Phone: [phone] Relation: Son Secondary Emergency Contact: Westley Footsobson,Michael  [place] Home Phone: [phone] Relation: Other  Advanced Directive information Does patient have an advance directive?: No, Would patient like information on creating an advanced directive?: No - patient declined information  Chief Complaint  Patient presents with  . Acute Visit    rash    HPI:  75 yo male long term resident seen today for rash. He reports that he forgot to use cream Rx by derm x 3-4 days when he completed scabies tx (tacrolimus). He has been scratching the area more and has caused maceration of right bka stump. No f/c. After s/w wound care, pt was to start A&D ointment after completing scabies tx last month but he never used it. Pt is a poor historian due to memory loss. Hx obtained from chart. He has seen dermatology for dermatitis in the past. He was tx with temovate crm which helped.   DM - stable on tuojeo 70 units daily; novolog 10 units with meals for cbg >=150; tradjenta 5 mg daily; A1c 6.5%  Edema - stable on demadex 20 mg daily    Past Medical History:  Diagnosis Date  . Chronic kidney disease   . Depression   . Diabetes mellitus without complication (HCC)   . Diabetic retinopathy (HCC)   . Edema   . Hyperlipidemia   . Hyperlipidemia LDL goal <100 07/16/2014  . Hypertension     Past Surgical History:  Procedure Laterality Date  . LEG AMPUTATION BELOW KNEE Bilateral   . right retinal detachment repair  1 22 14     Patient Care Team: Kirt BoysMonica Margorie Renner, DO as PCP - General (Internal Medicine) Sharee Holstereborah S Green, NP as Nurse Practitioner (Nurse  Practitioner) [facility]  Social History   Social History  . Marital status: Widowed    Spouse name: N/A  . Number of children: N/A  . Years of education: N/A   Occupational History  . Not on file.   Social History Main Topics  . Smoking status: Former Games developermoker  . Smokeless tobacco: Never Used  . Alcohol use No  . Drug use: No  . Sexual activity: No   Other Topics Concern  . Not on file   Social History Narrative  . No narrative on file     reports that he has quit smoking. He has never used smokeless tobacco. He reports that he does not drink alcohol or use drugs.  History reviewed. No pertinent family history. No family status information on file.    Immunization History  Administered Date(s) Administered  . Influenza-Unspecified 08/04/2014, 07/15/2015  . PPD Test 05/18/2011, 05/25/2011, 01/03/2015    No Known Allergies  Medications: Patient's Medications  New Prescriptions   No medications on file  Previous Medications   ACETAMINOPHEN (TYLENOL) 325 MG TABLET    Take 325 mg by mouth every 4 (four) hours as needed for pain.    ALUM HYDROXIDE-MAG TRISILICATE (GAVISCON) 80-14.2 MG CHEW    Chew 2 tablets by mouth every 4 (four) hours as needed (for indigestion).   AMLODIPINE (NORVASC) 10 MG TABLET    Take 10 mg  by mouth daily. Reported on 12/29/2015   ASPIRIN 81 MG TABLET    Take 81 mg by mouth daily.   ATORVASTATIN (LIPITOR) 80 MG TABLET    Take 80 mg by mouth daily.   B COMPLEX-VITAMIN C-FOLIC ACID (NEPHRO-VITE) 0.8 MG TABS    Take 0.8 mg by mouth at bedtime.   CALCITRIOL (ROCALTROL) 0.25 MCG CAPSULE    Take 0.25 mcg by mouth 3 (three) times a week. On Monday; Wednesday; Friday   CLONIDINE (CATAPRES - DOSED IN MG/24 HR) 0.2 MG/24HR PATCH    Place 1 patch onto the skin once a week. Wednesday   DOCUSATE SODIUM (COLACE) 100 MG CAPSULE    Take 100 mg by mouth 2 (two) times daily.   FISH OIL-OMEGA-3 FATTY ACIDS 1000 MG CAPSULE     Take 1 g by mouth daily.   GUAIFENESIN (ROBITUSSIN) 100 MG/5ML SOLN    Take 10 mLs by mouth every 6 (six) hours as needed for cough or to loosen phlegm.   HYDRALAZINE (APRESOLINE) 50 MG TABLET    Take 1 tablet (50 mg total) by mouth 4 (four) times daily.   INSULIN GLARGINE (TOUJEO SOLOSTAR) 300 UNIT/ML SOPN    Inject 70 Units into the skin daily.   INSULIN LISPRO (HUMALOG KWIKPEN) 100 UNIT/ML KIWKPEN    Inject into the skin. Per sliding scale 0-149= 0 units, 150+ = 10 units subcutaneously before meals for diabetes   LINAGLIPTIN (TRADJENTA) 5 MG TABS TABLET    Take 5 mg by mouth daily.   METOPROLOL (LOPRESSOR) 100 MG TABLET    Take 100 mg by mouth 2 (two) times daily.   POLYETHYL GLYCOL-PROPYL GLYCOL (SYSTANE) 0.4-0.3 % SOLN    Apply 1 drop to eye daily as needed. Both eyes   TORSEMIDE (DEMADEX) 20 MG TABLET    Take 20 mg by mouth daily.  Modified Medications   No medications on file  Discontinued Medications   TACROLIMUS (PROTOPIC) 0.1 % OINTMENT    Apply topically every 8 (eight) hours as needed.    Review of Systems  Unable to perform ROS: Other (memory loss)    Vitals:   07/17/16 1315  BP: 130/62  Pulse: 70  Resp: 18  Temp: 97.2 F (36.2 C)  TempSrc: Oral  SpO2: 97%  Weight: 198 lb 9.6 oz (90.1 kg)  Height: 4\' 2"  (1.27 m)   Body mass index is 55.85 kg/m.  Physical Exam  Constitutional: He appears well-developed and well-nourished.  Frail appearing sitting in w/c in NAD  Musculoskeletal: He exhibits edema and deformity (b/l BKA).  Neurological: He is alert.  Skin: Skin is warm and dry. Rash noted. Rash is papular (ppulosquamous patchy red rash with maceration on right BKA stump with no signs of secondary infection). Rash is not vesicular.  No d/c  Psychiatric: He has a normal mood and affect. His behavior is normal. Thought content normal.     Labs reviewed: Nursing Home on 07/17/2016  Component Date Value Ref Range Status  . Hemoglobin 07/02/2016 10.4* 13.5 - 17.5  g/dL Final  . HCT 16/07/9603 33* 41 - 53 % Final  . Platelets 07/02/2016 223  150 - 399 K/L Final  . WBC 07/02/2016 7.9  10^3/mL Final  . Glucose 07/02/2016 83  mg/dL Final  . BUN 54/06/8118 34* 4 - 21 mg/dL Final  . Creatinine 14/78/2956 2.3* 0.6 - 1.3 mg/dL Final  . Potassium 21/30/8657 4.7  3.4 - 5.3 mmol/L Final  . Sodium 07/02/2016 143  137 -  147 mmol/L Final  . Triglycerides 07/02/2016 136  40 - 160 mg/dL Final  . Cholesterol 69/62/9528 88  0 - 200 mg/dL Final  . HDL 41/32/4401 25* 35 - 70 mg/dL Final  . LDL Cholesterol 07/02/2016 36  mg/dL Final  . Alkaline Phosphatase 07/02/2016 83  25 - 125 U/L Final  . ALT 07/02/2016 19  10 - 40 U/L Final  . AST 07/02/2016 15  14 - 40 U/L Final  . Bilirubin, Total 07/02/2016 0.3  mg/dL Final  Nursing Home on 06/29/2016  Component Date Value Ref Range Status  . Glucose 06/18/2016 101  mg/dL Final  . BUN 02/72/5366 34* 4 - 21 mg/dL Final  . Creatinine 44/12/4740 2.2* 0.6 - 1.3 mg/dL Final  . Potassium 59/56/3875 4.4  3.4 - 5.3 mmol/L Final  . Sodium 06/18/2016 142  137 - 147 mmol/L Final  . Hemoglobin A1C 04/30/2016 6.5   Final    No results found.   Assessment/Plan   ICD-9-CM ICD-10-CM   1. Rash and nonspecific skin eruption - probable contact dermatitis 782.1 R21   2. S/P bilateral BKA (below knee amputation) (HCC) V49.75 Z89.512     Z89.511   3. Type 2 diabetes mellitus with stage 3 chronic kidney disease, with long-term current use of insulin (HCC) - controlled 250.40 E11.22    585.3 N18.3    V58.67 Z79.4    Start temovate crm 0.05% apply to rash on right BKA stump BID x 2 weeks then use prn  Will have wound care follow rash to ensure he uses cream as ordered  T/c f/u with dermatology if no better  Cont other meds as ordered  Will follow   Dempsey Ahonen S. Ancil Linsey  [facility] [address] [place], [place] [address] [phone] Cell (Monday-Friday 8 AM - 5  PM) [phone] After 5 PM and follow prompts

## 2016-07-26 ENCOUNTER — Encounter: Payer: Self-pay | Admitting: Adult Health

## 2016-07-26 ENCOUNTER — Non-Acute Institutional Stay (SKILLED_NURSING_FACILITY): Payer: Medicare Other | Admitting: Adult Health

## 2016-07-26 DIAGNOSIS — I129 Hypertensive chronic kidney disease with stage 1 through stage 4 chronic kidney disease, or unspecified chronic kidney disease: Secondary | ICD-10-CM

## 2016-07-26 DIAGNOSIS — E1121 Type 2 diabetes mellitus with diabetic nephropathy: Secondary | ICD-10-CM

## 2016-07-26 DIAGNOSIS — Z89511 Acquired absence of right leg below knee: Secondary | ICD-10-CM | POA: Diagnosis not present

## 2016-07-26 DIAGNOSIS — M150 Primary generalized (osteo)arthritis: Secondary | ICD-10-CM

## 2016-07-26 DIAGNOSIS — N183 Chronic kidney disease, stage 3 unspecified: Secondary | ICD-10-CM

## 2016-07-26 DIAGNOSIS — E1169 Type 2 diabetes mellitus with other specified complication: Secondary | ICD-10-CM

## 2016-07-26 DIAGNOSIS — E785 Hyperlipidemia, unspecified: Secondary | ICD-10-CM

## 2016-07-26 DIAGNOSIS — M159 Polyosteoarthritis, unspecified: Secondary | ICD-10-CM

## 2016-07-26 DIAGNOSIS — E1122 Type 2 diabetes mellitus with diabetic chronic kidney disease: Secondary | ICD-10-CM | POA: Diagnosis not present

## 2016-07-26 DIAGNOSIS — R600 Localized edema: Secondary | ICD-10-CM | POA: Diagnosis not present

## 2016-07-26 DIAGNOSIS — E113593 Type 2 diabetes mellitus with proliferative diabetic retinopathy without macular edema, bilateral: Secondary | ICD-10-CM

## 2016-07-26 DIAGNOSIS — Z89512 Acquired absence of left leg below knee: Secondary | ICD-10-CM

## 2016-07-26 DIAGNOSIS — I25119 Atherosclerotic heart disease of native coronary artery with unspecified angina pectoris: Secondary | ICD-10-CM

## 2016-07-26 NOTE — Progress Notes (Signed)
Patient ID: Marc LieuJimmy Soth, male   DOB: 11/02/1940, 75 y.o.   MRN: [id number]    Provider:  Synthia Innocenteborah Eilene Voigt, NP [facility]  [facility] Room Number: 123-A Place of Service:  SNF (31)   PCP: Kirt Boysarter, Monica, DO Patient Care Team: Kirt BoysMonica Carter, DO as PCP - General (Internal Medicine) Sharee Holstereborah S Nicklaus Alviar, NP as Nurse Practitioner (Nurse Practitioner) [facility]  Extended Emergency Contact Information Primary Emergency Contact: Limon,Avrohom Lyn HenriWesley  [place] Home Phone: [phone] Relation: Son Secondary Emergency Contact: Westley Footsobson,Michael  [place] Home Phone: [phone] Relation: Other  Code Status: Full Code Goals of Care: Advanced Directive information Advanced Directives 07/26/2016  Does patient have an advance directive? No  Type of Advance Directive -  Does patient want to make changes to advanced directive? -  Copy of advanced directive(s) in chart? -  Would patient like information on creating an advanced directive? No - patient declined information      No Known Allergies   Chief Complaint  Patient presents with  . Annual Exam    Annual Exam    HPI: Patient is a 75 y.o. male seen today for an annual comprehensive examination. His status has remained stable. He has done well over the past year. He has not been hospitalized over the past year. He is not voicing any complaints and is telling me that he is feeling good. There are no nursing concerns at this time.   Past Medical History:  Diagnosis Date  . Chronic kidney disease   . Depression   . Diabetes mellitus without complication (HCC)   . Diabetic retinopathy (HCC)   . Edema   . Hyperlipidemia   . Hyperlipidemia LDL goal <100 07/16/2014  . Hypertension    Past Surgical History:  Procedure Laterality Date  . LEG AMPUTATION BELOW KNEE Bilateral   . right retinal detachment repair  1 22 14     reports that he has quit  smoking. He has never used smokeless tobacco. He reports that he does not drink alcohol or use drugs. Social History   Social History  . Marital status: Widowed    Spouse name: N/A  . Number of children: N/A  . Years of education: N/A   Occupational History  . Not on file.   Social History Main Topics  . Smoking status: Former Games developermoker  . Smokeless tobacco: Never Used  . Alcohol use No  . Drug use: No  . Sexual activity: No   Other Topics Concern  . Not on file   Social History Narrative  . No narrative on file   History reviewed. No pertinent family history.  Vitals:   07/26/16 1408  BP: (!) 146/74  Pulse: 72  Resp: 20  Temp: 97.1 F (36.2 C)  TempSrc: Oral  SpO2: 98%  Weight: 198 lb 6 oz (90 kg)  Height: 4\' 2"  (1.27 m)   Body mass index is 55.79 kg/m.    Medication List       Accurate as of 07/26/16  2:27 PM. Always use your most recent med list.          acetaminophen 325 MG tablet Commonly known as:  TYLENOL Take 325 mg by mouth every 4 (four) hours as needed for pain.   amLODipine 10 MG tablet Commonly known as:  NORVASC Take 10 mg by mouth daily. Reported on 12/29/2015   aspirin 81 MG tablet Take 81 mg by mouth daily.  atorvastatin 80 MG tablet Commonly known as:  LIPITOR Take 80 mg by mouth daily.   b complex-vitamin c-folic acid 0.8 MG Tabs tablet Take 0.8 mg by mouth at bedtime.   calcitRIOL 0.25 MCG capsule Commonly known as:  ROCALTROL Take 0.25 mcg by mouth 3 (three) times a week. On Monday; Wednesday; Friday   cloNIDine 0.2 mg/24hr patch Commonly known as:  CATAPRES - Dosed in mg/24 hr Place 1 patch onto the skin once a week. Wednesday   docusate sodium 100 MG capsule Commonly known as:  COLACE Take 100 mg by mouth 2 (two) times daily.   fish oil-omega-3 fatty acids 1000 MG capsule Take 1 g by mouth daily.   GAVISCON 80-14.2 MG Chew Generic drug:  Alum Hydroxide-Mag Trisilicate Chew 2 tablets by mouth every 4 (four) hours  as needed (for indigestion).   guaiFENesin 100 MG/5ML Soln Commonly known as:  ROBITUSSIN Take 10 mLs by mouth every 6 (six) hours as needed for cough or to loosen phlegm.   HUMALOG KWIKPEN 100 UNIT/ML KiwkPen Generic drug:  insulin lispro Inject into the skin. Per sliding scale 0-149= 0 units, 150+ = 10 units subcutaneously before meals for diabetes   hydrALAZINE 50 MG tablet Commonly known as:  APRESOLINE Take 1 tablet (50 mg total) by mouth 4 (four) times daily.   Insulin Glargine 300 UNIT/ML Sopn Commonly known as:  TOUJEO SOLOSTAR Inject 70 Units into the skin daily.   linagliptin 5 MG Tabs tablet Commonly known as:  TRADJENTA Take 5 mg by mouth daily.   metoprolol 100 MG tablet Commonly known as:  LOPRESSOR Take 100 mg by mouth 2 (two) times daily.   SYSTANE 0.4-0.3 % Soln Generic drug:  Polyethyl Glycol-Propyl Glycol Apply 1 drop to eye daily as needed. Both eyes   TEMOVATE 0.05 % Generic drug:  clobetasol cream Apply 1 application topically every 4 (four) hours as needed.   torsemide 20 MG tablet Commonly known as:  DEMADEX Take 20 mg by mouth daily.        SIGNIFICANT DIAGNOSTIC EXAMS  01-05-15: EKG: SR with first degree AVB   08-25-15: chest x-ray: Left lower lobe atelectasis.  No superimposed acute abnormality.    LABS REVIEWED:   08-25-15: wbc 7.7; hgb 12.1; hct 36.9; mcv 82.9; plt 194; glucose 140; bun 33; creat 2.16; k+ 4.0; na++1141; alt 98; albumin 3.0  09-02-15: glucose 102; bun 32; creat 2.10; k+ 4.4; na++142; liver normal albumin 3.4;ferritin 411; PTH 136; ca 9.2; hgb a1c 6.3  10-31-15: chol 93; ldl 39; trig 139; hdl 26  0-98-11: wbc 5.9; hgb 10.2; hct 32.4 mcv 82.9; plt 192; glucose 112; bun 46; create 2.43; k+ 4.4; na++144; liver normal albumin 3.5; phos 3.9; ferritin 378; iron 39; tibc 180 02-01-16: hgb a1c 7.4  04-30-16: hgb a1c 6.5  06-18-16: glucose 101; bun 24; creat 2.24; k+ 4.4; na++ 142  07-02-16: wbc 7.9; hgb 10.4; ht 32.5 ;mcv 81.7 plt  223; glucose 83; bun 34; creat 2.34; k+ 4.7; na++ 143; liver normal albumin 3.7; chol 88; ldl 36; trig 136; hdl 25    Review of Systems Constitutional: Negative for malaise/fatigue.  Respiratory: Negative for cough and shortness of breath.   Cardiovascular: Negative for chest pain and palpitations.  Gastrointestinal: Negative for heartburn, abdominal pain and constipation.  Musculoskeletal: Negative for myalgias and joint pain.  Skin:no concerns  Neurological: Negative for weakness and headaches.  Psychiatric/Behavioral: The patient is not nervous/anxious.    Physical Exam Constitutional: He is oriented  to person, place, and time. He appears well-developed and well-nourished. No distress.  Obese   Neck: Neck supple. No JVD present. No thyromegaly present.  Cardiovascular: Normal rate and regular rhythm.   Respiratory: Effort normal and breath sounds normal. No respiratory distress.  GI: Soft. Bowel sounds are normal. He exhibits no distension. There is no tenderness.  Musculoskeletal:  Bilateral bka;  Is able to move upper extremities   Neurological: He is alert and oriented to person, place, and time.  Skin: Skin is warm and dry. He is not diaphoretic.           ASSESSMENT/ PLAN:  1. Diabetes: will continue tuojeo 70 units daily; novolog 10 units with meals for cbg >=150; tradjenta 5 mg daily;  will monitor his hgb a1c is 6.5  2. Hypertensive renal disease: will continue norvasc 10 mg daily; lopressor 100 mg twice daily; clonidine 0.2 mg patch changed weekly;  hydralazine 50 mg four times daily;   3. Dyslipidemia: will continue lipitor 80 mg daily; fish oil 1 gm daily his ldl is 39   4. 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 Diabetic retinopathy: is on systane eye drop to both eyes is followed by eye  doctor   6. Osteoarthritis: is stable; is presently not on medications; will continue to monitor her status.    7. Constipation: will continue colace twice daily   8. CAD: is stable no complaint of chest pain present; will continue lopressor 100 mg twice daily    9. Edema: will continue demadex 20 mg daily   Will get urine for micro-albumin and hgb a1c  His health maintenance is up to date.   Time spent with patient 40   minutes >50% time spent counseling; reviewing medical record; tests; labs; and developing future plan of care   Synthia Innocent NP [facility]  Contact [phone] Monday through Friday 8am- 5pm  After hours call [phone]

## 2016-07-27 DIAGNOSIS — N179 Acute kidney failure, unspecified: Secondary | ICD-10-CM | POA: Diagnosis not present

## 2016-07-27 DIAGNOSIS — E0821 Diabetes mellitus due to underlying condition with diabetic nephropathy: Secondary | ICD-10-CM | POA: Diagnosis not present

## 2016-08-02 DIAGNOSIS — E119 Type 2 diabetes mellitus without complications: Secondary | ICD-10-CM | POA: Diagnosis not present

## 2016-08-07 DIAGNOSIS — N183 Chronic kidney disease, stage 3 (moderate): Secondary | ICD-10-CM | POA: Diagnosis not present

## 2016-08-07 DIAGNOSIS — E1165 Type 2 diabetes mellitus with hyperglycemia: Secondary | ICD-10-CM | POA: Diagnosis not present

## 2016-08-07 DIAGNOSIS — D631 Anemia in chronic kidney disease: Secondary | ICD-10-CM | POA: Diagnosis not present

## 2016-08-07 DIAGNOSIS — N2581 Secondary hyperparathyroidism of renal origin: Secondary | ICD-10-CM | POA: Diagnosis not present

## 2016-08-07 DIAGNOSIS — R609 Edema, unspecified: Secondary | ICD-10-CM | POA: Diagnosis not present

## 2016-08-07 DIAGNOSIS — N189 Chronic kidney disease, unspecified: Secondary | ICD-10-CM | POA: Diagnosis not present

## 2016-08-07 DIAGNOSIS — R809 Proteinuria, unspecified: Secondary | ICD-10-CM | POA: Diagnosis not present

## 2016-08-07 DIAGNOSIS — E1129 Type 2 diabetes mellitus with other diabetic kidney complication: Secondary | ICD-10-CM | POA: Diagnosis not present

## 2016-08-07 DIAGNOSIS — I129 Hypertensive chronic kidney disease with stage 1 through stage 4 chronic kidney disease, or unspecified chronic kidney disease: Secondary | ICD-10-CM | POA: Diagnosis not present

## 2016-08-08 DIAGNOSIS — N19 Unspecified kidney failure: Secondary | ICD-10-CM | POA: Diagnosis not present

## 2016-08-08 DIAGNOSIS — D649 Anemia, unspecified: Secondary | ICD-10-CM | POA: Diagnosis not present

## 2016-08-16 DIAGNOSIS — N183 Chronic kidney disease, stage 3 unspecified: Secondary | ICD-10-CM | POA: Insufficient documentation

## 2016-08-27 ENCOUNTER — Non-Acute Institutional Stay (SKILLED_NURSING_FACILITY): Payer: Medicare Other | Admitting: Adult Health

## 2016-08-27 ENCOUNTER — Encounter: Payer: Self-pay | Admitting: Adult Health

## 2016-08-27 DIAGNOSIS — Z89511 Acquired absence of right leg below knee: Secondary | ICD-10-CM

## 2016-08-27 DIAGNOSIS — N183 Chronic kidney disease, stage 3 unspecified: Secondary | ICD-10-CM

## 2016-08-27 DIAGNOSIS — E1121 Type 2 diabetes mellitus with diabetic nephropathy: Secondary | ICD-10-CM | POA: Diagnosis not present

## 2016-08-27 DIAGNOSIS — Z89512 Acquired absence of left leg below knee: Secondary | ICD-10-CM

## 2016-08-27 DIAGNOSIS — M150 Primary generalized (osteo)arthritis: Secondary | ICD-10-CM | POA: Diagnosis not present

## 2016-08-27 DIAGNOSIS — E785 Hyperlipidemia, unspecified: Secondary | ICD-10-CM | POA: Diagnosis not present

## 2016-08-27 DIAGNOSIS — I129 Hypertensive chronic kidney disease with stage 1 through stage 4 chronic kidney disease, or unspecified chronic kidney disease: Secondary | ICD-10-CM | POA: Diagnosis not present

## 2016-08-27 DIAGNOSIS — E1122 Type 2 diabetes mellitus with diabetic chronic kidney disease: Secondary | ICD-10-CM

## 2016-08-27 DIAGNOSIS — I209 Angina pectoris, unspecified: Secondary | ICD-10-CM | POA: Diagnosis not present

## 2016-08-27 DIAGNOSIS — I25119 Atherosclerotic heart disease of native coronary artery with unspecified angina pectoris: Secondary | ICD-10-CM

## 2016-08-27 DIAGNOSIS — E1169 Type 2 diabetes mellitus with other specified complication: Secondary | ICD-10-CM | POA: Diagnosis not present

## 2016-08-27 DIAGNOSIS — M159 Polyosteoarthritis, unspecified: Secondary | ICD-10-CM

## 2016-08-27 NOTE — Progress Notes (Signed)
Patient ID: Marc LieuJimmy Mchargue, male   DOB: 02/20/1941, 75 y.o.   MRN: [id number]   Location[facility]    [facility] Room Number: 123-A Place of Service:  SNF (31)   CODE STATUS: Full Code  No Known Allergies  Chief Complaint  Patient presents with  . Medical Management of Chronic Issues    Follow up    HPI:  He is a long term resident of this facility being seen for the management of his chronic illnesses. Overall his status is stable. He tells me that he feels good and has no complaints. There are no nursing concerns at this time.   Past Medical History:  Diagnosis Date  . Chronic kidney disease   . Depression   . Diabetes mellitus without complication (HCC)   . Diabetic retinopathy (HCC)   . Edema   . Hyperlipidemia   . Hyperlipidemia LDL goal <100 07/16/2014  . Hypertension     Past Surgical History:  Procedure Laterality Date  . LEG AMPUTATION BELOW KNEE Bilateral   . right retinal detachment repair  1 22 14     Social History   Social History  . Marital status: Widowed    Spouse name: N/A  . Number of children: N/A  . Years of education: N/A   Occupational History  . Not on file.   Social History Main Topics  . Smoking status: Former Games developermoker  . Smokeless tobacco: Never Used  . Alcohol use No  . Drug use: No  . Sexual activity: No   Other Topics Concern  . Not on file   Social History Narrative  . No narrative on file   History reviewed. No pertinent family history.    VITAL SIGNS BP 123/73   Pulse 66   Temp 97.7 F (36.5 C) (Oral)   Resp 20   Ht 4\' 2"  (1.27 m)   Wt 199 lb (90.3 kg)   SpO2 96%   BMI 55.96 kg/m   Patient's Medications  New Prescriptions   No medications on file  Previous Medications   ACETAMINOPHEN (TYLENOL) 325 MG TABLET    Take 325 mg by mouth every 4 (four) hours as needed for pain.    ALUM HYDROXIDE-MAG TRISILICATE (GAVISCON) 80-14.2 MG CHEW    Chew 2 tablets by mouth every 4 (four) hours as needed (for  indigestion).   AMLODIPINE (NORVASC) 10 MG TABLET    Take 10 mg by mouth daily. Reported on 12/29/2015   ASPIRIN 81 MG TABLET    Take 81 mg by mouth daily.   ATORVASTATIN (LIPITOR) 80 MG TABLET    Take 80 mg by mouth daily.   B COMPLEX-VITAMIN C-FOLIC ACID (NEPHRO-VITE) 0.8 MG TABS    Take 0.8 mg by mouth at bedtime.   CALCITRIOL (ROCALTROL) 0.25 MCG CAPSULE    Take 0.25 mcg by mouth 3 (three) times a week. On Monday; Wednesday; Friday   CLOBETASOL CREAM (TEMOVATE) 0.05 %    Apply 1 application topically every 4 (four) hours as needed.   CLONIDINE (CATAPRES - DOSED IN MG/24 HR) 0.2 MG/24HR PATCH    Place 1 patch onto the skin once a week. Wednesday   DOCUSATE SODIUM (COLACE) 100 MG CAPSULE    Take 100 mg by mouth 2 (two) times daily.   FISH OIL-OMEGA-3 FATTY ACIDS 1000 MG CAPSULE    Take 1 g by mouth daily.   FUROSEMIDE (LASIX) 40 MG TABLET    Take 40 mg by mouth daily.   GUAIFENESIN (  ROBITUSSIN) 100 MG/5ML SOLN    Take 10 mLs by mouth every 6 (six) hours as needed for cough or to loosen phlegm.   HYDRALAZINE (APRESOLINE) 50 MG TABLET    Take 1 tablet (50 mg total) by mouth 4 (four) times daily.   INSULIN GLARGINE (TOUJEO SOLOSTAR) 300 UNIT/ML SOPN    Inject 70 Units into the skin daily.   INSULIN LISPRO (HUMALOG KWIKPEN) 100 UNIT/ML KIWKPEN    Inject into the skin. Per sliding scale 0-149= 0 units, 150+ = 10 units subcutaneously before meals for diabetes   LINAGLIPTIN (TRADJENTA) 5 MG TABS TABLET    Take 5 mg by mouth daily.   METOPROLOL (LOPRESSOR) 100 MG TABLET    Take 100 mg by mouth 2 (two) times daily.   POLYETHYL GLYCOL-PROPYL GLYCOL (SYSTANE) 0.4-0.3 % SOLN    Apply 1 drop to eye daily as needed. Both eyes  Modified Medications   No medications on file  Discontinued Medications   TORSEMIDE (DEMADEX) 20 MG TABLET    Take 20 mg by mouth daily.     SIGNIFICANT DIAGNOSTIC EXAMS  08-25-15: wbc 7.7; hgb 12.1; hct 36.9; mcv 82.9; plt 194; glucose 140; bun 33; creat 2.16; k+ 4.0; na++1141;  alt 98; albumin 3.0  09-02-15: glucose 102; bun 32; creat 2.10; k+ 4.4; na++142; liver normal albumin 3.4;ferritin 411; PTH 136; ca 9.2; hgb a1c 6.3  10-31-15: chol 93; ldl 39; trig 139; hdl 26  6-38-753-24-17: wbc 5.9; hgb 10.2; hct 32.4 mcv 82.9; plt 192; glucose 112; bun 46; create 2.43; k+ 4.4; na++144; liver normal albumin 3.5; phos 3.9; ferritin 378; iron 39; tibc 180 02-01-16: hgb a1c 7.4  04-30-16: hgb a1c 6.5  06-18-16: glucose 101; bun 24; creat 2.24; k+ 4.4; na++ 142  07-02-16: wbc 7.9; hgb 10.4; ht 32.5 ;mcv 81.7 plt 223; glucose 83; bun 34; creat 2.34; k+ 4.7; na++ 143; liver normal albumin 3.7; chol 88; ldl 36; trig 136; hdl 25    Review of Systems Constitutional: Negative for malaise/fatigue.  Respiratory: Negative for cough and shortness of breath.   Cardiovascular: Negative for chest pain and palpitations.  Gastrointestinal: Negative for heartburn, abdominal pain and constipation.  Musculoskeletal: Negative for myalgias and joint pain.  Skin:no concerns  Neurological: Negative for weakness and headaches.  Psychiatric/Behavioral: The patient is not nervous/anxious.    Physical Exam Constitutional: He is oriented to person, place, and time. He appears well-developed and well-nourished. No distress.  Obese   Neck: Neck supple. No JVD present. No thyromegaly present.  Cardiovascular: Normal rate and regular rhythm.   Respiratory: Effort normal and breath sounds normal. No respiratory distress.  GI: Soft. Bowel sounds are normal. He exhibits no distension. There is no tenderness.  Musculoskeletal:  Bilateral bka;  Is able to move upper extremities   Neurological: He is alert and oriented to person, place, and time.  Skin: Skin is warm and dry. He is not diaphoretic.           ASSESSMENT/ PLAN:  1. Diabetes: will continue tuojeo 70 units daily; novolog 10 units with meals for cbg >=150; tradjenta 5 mg daily;  will monitor his hgb a1c is 6.5  2. Hypertensive renal disease:  will continue norvasc 10 mg daily; lopressor 100 mg twice daily; clonidine 0.2 mg patch changed weekly;  hydralazine 50 mg four times daily;   3. Dyslipidemia: will continue lipitor 80 mg daily; fish oil 1 gm daily his ldl is 39   4. 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 Diabetic retinopathy: is on systane eye drop to both eyes is followed by eye doctor   6. Osteoarthritis: is stable; is presently not on medications; will continue to monitor her status.    7. Constipation: will continue colace twice daily   8. CAD: is stable no complaint of chest pain present; will continue lopressor 100 mg twice daily       MD is aware of resident's narcotic use and is in agreement with current plan of care. We will attempt to wean resident as appropriate.     Synthia Innocent NP [facility]  Contact [phone] Monday through Friday 8am- 5pm  After hours call [phone]

## 2016-09-03 DIAGNOSIS — D631 Anemia in chronic kidney disease: Secondary | ICD-10-CM | POA: Diagnosis not present

## 2016-09-03 LAB — MICROALBUMIN, URINE: MICROALB UR: 1.2

## 2016-09-27 ENCOUNTER — Encounter: Payer: Self-pay | Admitting: Adult Health

## 2016-09-27 ENCOUNTER — Non-Acute Institutional Stay (SKILLED_NURSING_FACILITY): Payer: Medicare Other | Admitting: Adult Health

## 2016-09-27 DIAGNOSIS — E1122 Type 2 diabetes mellitus with diabetic chronic kidney disease: Secondary | ICD-10-CM | POA: Diagnosis not present

## 2016-09-27 DIAGNOSIS — I209 Angina pectoris, unspecified: Secondary | ICD-10-CM

## 2016-09-27 DIAGNOSIS — E1121 Type 2 diabetes mellitus with diabetic nephropathy: Secondary | ICD-10-CM

## 2016-09-27 DIAGNOSIS — N183 Chronic kidney disease, stage 3 unspecified: Secondary | ICD-10-CM

## 2016-09-27 DIAGNOSIS — I25119 Atherosclerotic heart disease of native coronary artery with unspecified angina pectoris: Secondary | ICD-10-CM

## 2016-09-27 DIAGNOSIS — E1169 Type 2 diabetes mellitus with other specified complication: Secondary | ICD-10-CM

## 2016-09-27 DIAGNOSIS — E785 Hyperlipidemia, unspecified: Secondary | ICD-10-CM

## 2016-09-27 DIAGNOSIS — Z89512 Acquired absence of left leg below knee: Secondary | ICD-10-CM | POA: Diagnosis not present

## 2016-09-27 DIAGNOSIS — Z89511 Acquired absence of right leg below knee: Secondary | ICD-10-CM

## 2016-09-27 DIAGNOSIS — I129 Hypertensive chronic kidney disease with stage 1 through stage 4 chronic kidney disease, or unspecified chronic kidney disease: Secondary | ICD-10-CM | POA: Diagnosis not present

## 2016-09-27 NOTE — Progress Notes (Signed)
Patient ID: Marc Wheeler, male   DOB: 12/16/1940, 75 y.o.   MRN: [id number]   Location:   [facility] Room Number: 123-A Place of Service:  SNF (31)   CODE STATUS: Full Code  No Known Allergies  Chief Complaint  Patient presents with  . Medical Management of Chronic Issues    follow up    HPI:  He is a long term resident of this facility  being seen for the management of his chronic illnesses. Overall his status is stable. He does get out of bed daily and does propel self throughout the facility. He is not voicing any complaints at this time. There are no nursing concerns at this time.    Past Medical History:  Diagnosis Date  . Chronic kidney disease   . Depression   . Diabetes mellitus without complication (HCC)   . Diabetic retinopathy (HCC)   . Edema   . Hyperlipidemia   . Hyperlipidemia LDL goal <100 07/16/2014  . Hypertension     Past Surgical History:  Procedure Laterality Date  . LEG AMPUTATION BELOW KNEE Bilateral   . right retinal detachment repair  1 22 14     Social History   Social History  . Marital status: Widowed    Spouse name: N/A  . Number of children: N/A  . Years of education: N/A   Occupational History  . Not on file.   Social History Main Topics  . Smoking status: Former Games developermoker  . Smokeless tobacco: Never Used  . Alcohol use No  . Drug use: No  . Sexual activity: No   Other Topics Concern  . Not on file   Social History Narrative  . No narrative on file   History reviewed. No pertinent family history.    VITAL SIGNS There were no vitals taken for this visit.  Patient's Medications  New Prescriptions   No medications on file  Previous Medications   ACETAMINOPHEN (TYLENOL) 325 MG TABLET    Take 325 mg by mouth every 4 (four) hours as needed for pain.    ALUM HYDROXIDE-MAG TRISILICATE (GAVISCON) 80-14.2 MG CHEW    Chew 2 tablets by mouth every 4 (four) hours as needed (for indigestion).   AMLODIPINE (NORVASC)  10 MG TABLET    Take 10 mg by mouth daily. Reported on 12/29/2015   ASPIRIN 81 MG TABLET    Take 81 mg by mouth daily.   ATORVASTATIN (LIPITOR) 80 MG TABLET    Take 80 mg by mouth daily.   B COMPLEX-VITAMIN C-FOLIC ACID (NEPHRO-VITE) 0.8 MG TABS    Take 0.8 mg by mouth at bedtime.   CALCITRIOL (ROCALTROL) 0.25 MCG CAPSULE    Take 0.25 mcg by mouth 3 (three) times a week. On Monday; Wednesday; Friday   CLOBETASOL CREAM (TEMOVATE) 0.05 %    Apply 1 application topically every 4 (four) hours as needed.   CLONIDINE (CATAPRES - DOSED IN MG/24 HR) 0.2 MG/24HR PATCH    Place 1 patch onto the skin once a week. Wednesday   DOCUSATE SODIUM (COLACE) 100 MG CAPSULE    Take 100 mg by mouth 2 (two) times daily.   FISH OIL-OMEGA-3 FATTY ACIDS 1000 MG CAPSULE    Take 1 g by mouth daily.   FUROSEMIDE (LASIX) 40 MG TABLET    Take 40 mg by mouth daily.   GUAIFENESIN (ROBITUSSIN) 100 MG/5ML SOLN    Take 10 mLs by mouth every 6 (six) hours as needed for cough or  to loosen phlegm.   HYDRALAZINE (APRESOLINE) 50 MG TABLET    Take 1 tablet (50 mg total) by mouth 4 (four) times daily.   INSULIN GLARGINE (TOUJEO SOLOSTAR) 300 UNIT/ML SOPN    Inject 70 Units into the skin daily.   INSULIN LISPRO (HUMALOG KWIKPEN) 100 UNIT/ML KIWKPEN    Inject into the skin. Per sliding scale 0-149= 0 units, 150+ = 10 units subcutaneously before meals for diabetes   LINAGLIPTIN (TRADJENTA) 5 MG TABS TABLET    Take 5 mg by mouth daily.   METOPROLOL (LOPRESSOR) 100 MG TABLET    Take 100 mg by mouth 2 (two) times daily.   POLYETHYL GLYCOL-PROPYL GLYCOL (SYSTANE) 0.4-0.3 % SOLN    Apply 1 drop to eye daily as needed. Both eyes   TACROLIMUS (PROTOPIC) 0.1 % OINTMENT    Apply topically every 8 (eight) hours as needed.  Modified Medications   No medications on file  Discontinued Medications   No medications on file     SIGNIFICANT DIAGNOSTIC EXAMS  10-31-15: chol 93; ldl 39; trig 139; hdl 26  4-54-09: wbc 5.9; hgb 10.2; hct 32.4 mcv 82.9; plt  192; glucose 112; bun 46; create 2.43; k+ 4.4; na++144; liver normal albumin 3.5; phos 3.9; ferritin 378; iron 39; tibc 180 02-01-16: hgb a1c 7.4  04-30-16: hgb a1c 6.5  06-18-16: glucose 101; bun 24; creat 2.24; k+ 4.4; na++ 142  07-02-16: wbc 7.9; hgb 10.4; ht 32.5 ;mcv 81.7 plt 223; glucose 83; bun 34; creat 2.34; k+ 4.7; na++ 143; liver normal albumin 3.7; chol 88; ldl 36; trig 136; hdl 25  81-19-14; hgb a1c 6.8 09-02-16: urine micro-albumin: 1.2   Review of Systems Constitutional: Negative for malaise/fatigue.  Respiratory: Negative for cough and shortness of breath.   Cardiovascular: Negative for chest pain and palpitations.  Gastrointestinal: Negative for heartburn, abdominal pain and constipation.  Musculoskeletal: Negative for myalgias and joint pain.  Skin:no concerns  Neurological: Negative for weakness and headaches.  Psychiatric/Behavioral: The patient is not nervous/anxious.    Physical Exam Constitutional: He is oriented to person, place, and time. He appears well-developed and well-nourished. No distress.  Obese   Neck: Neck supple. No JVD present. No thyromegaly present.  Cardiovascular: Normal rate and regular rhythm.   Respiratory: Effort normal and breath sounds normal. No respiratory distress.  GI: Soft. Bowel sounds are normal. He exhibits no distension. There is no tenderness.  Musculoskeletal:  Bilateral bka;  Is able to move upper extremities   Neurological: He is alert and oriented to person, place, and time.  Skin: Skin is warm and dry. He is not diaphoretic.           ASSESSMENT/ PLAN:  1. Diabetes: will continue tuojeo 70 units daily; novolog 10 units with meals for cbg >=150; tradjenta 5 mg daily;  will monitor his hgb a1c is 6.8  2. Hypertensive renal disease: will continue norvasc 10 mg daily; lopressor 100 mg twice daily; clonidine 0.2 mg patch changed weekly;  hydralazine 50 mg four times daily;   3. Dyslipidemia: will continue lipitor 80 mg  daily; fish oil 1 gm daily his ldl is 39   4. 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 Diabetic retinopathy: is on systane eye drop to both eyes is followed by eye doctor   6. Osteoarthritis:  is stable; is presently not on medications; will continue to monitor her status.    7. Constipation: will continue colace twice daily   8. CAD: is stable no complaint of chest pain present; will continue lopressor 100 mg twice daily     Synthia Innocenteborah Green NP [facility]  Contact [phone] Monday through Friday 8am- 5pm  After hours call [phone]

## 2016-11-01 DIAGNOSIS — Z79899 Other long term (current) drug therapy: Secondary | ICD-10-CM | POA: Diagnosis not present

## 2016-11-02 ENCOUNTER — Non-Acute Institutional Stay (SKILLED_NURSING_FACILITY): Payer: Medicare Other | Admitting: Adult Health

## 2016-11-02 DIAGNOSIS — J3089 Other allergic rhinitis: Secondary | ICD-10-CM

## 2016-11-02 DIAGNOSIS — I209 Angina pectoris, unspecified: Secondary | ICD-10-CM

## 2016-11-02 DIAGNOSIS — Z89511 Acquired absence of right leg below knee: Secondary | ICD-10-CM | POA: Diagnosis not present

## 2016-11-02 DIAGNOSIS — I25119 Atherosclerotic heart disease of native coronary artery with unspecified angina pectoris: Secondary | ICD-10-CM | POA: Diagnosis not present

## 2016-11-02 DIAGNOSIS — N183 Chronic kidney disease, stage 3 (moderate): Secondary | ICD-10-CM

## 2016-11-02 DIAGNOSIS — E1169 Type 2 diabetes mellitus with other specified complication: Secondary | ICD-10-CM | POA: Diagnosis not present

## 2016-11-02 DIAGNOSIS — E1121 Type 2 diabetes mellitus with diabetic nephropathy: Secondary | ICD-10-CM

## 2016-11-02 DIAGNOSIS — M159 Polyosteoarthritis, unspecified: Secondary | ICD-10-CM

## 2016-11-02 DIAGNOSIS — E1122 Type 2 diabetes mellitus with diabetic chronic kidney disease: Secondary | ICD-10-CM

## 2016-11-02 DIAGNOSIS — M150 Primary generalized (osteo)arthritis: Secondary | ICD-10-CM

## 2016-11-02 DIAGNOSIS — E785 Hyperlipidemia, unspecified: Secondary | ICD-10-CM | POA: Diagnosis not present

## 2016-11-02 DIAGNOSIS — Z89512 Acquired absence of left leg below knee: Secondary | ICD-10-CM | POA: Diagnosis not present

## 2016-11-20 ENCOUNTER — Encounter: Payer: Self-pay | Admitting: Adult Health

## 2016-11-20 DIAGNOSIS — J309 Allergic rhinitis, unspecified: Secondary | ICD-10-CM | POA: Insufficient documentation

## 2016-11-20 NOTE — Progress Notes (Signed)
Location:   Database administrator of Service:  SNF (31)   CODE STATUS: full code   No Known Allergies  Chief Complaint  Patient presents with  . Medical Management of Chronic Issues    HPI:  He is a long term resident of this facility  being seen for the management of his chronic illnesses. Overall his status is stable. He tells me for the past 2-3 days he has had a cough with hoarseness present. He denies feeling states he feels good except for his cough and hoarseness.  There are no fevers present. He tells me that the cough is dry.   Past Medical History:  Diagnosis Date  . Chronic kidney disease   . Depression   . Diabetes mellitus without complication (HCC)   . Diabetic retinopathy (HCC)   . Edema   . Hyperlipidemia   . Hyperlipidemia LDL goal <100 07/16/2014  . Hypertension     Past Surgical History:  Procedure Laterality Date  . LEG AMPUTATION BELOW KNEE Bilateral   . right retinal detachment repair  1 22 14     Social History   Social History  . Marital status: Widowed    Spouse name: N/A  . Number of children: N/A  . Years of education: N/A   Occupational History  . Not on file.   Social History Main Topics  . Smoking status: Former Games developer  . Smokeless tobacco: Never Used  . Alcohol use No  . Drug use: No  . Sexual activity: No   Other Topics Concern  . Not on file   Social History Narrative  . No narrative on file   No family history on file.    VITAL SIGNS BP 125/60   Pulse 72   Temp 98.2 F (36.8 C)   Resp 20   Ht 4\' 2"  (1.27 m)   Wt 178 lb 14.4 oz (81.1 kg)   SpO2 98%   BMI 50.31 kg/m   Patient's Medications  New Prescriptions   No medications on file  Previous Medications   ACETAMINOPHEN (TYLENOL) 325 MG TABLET    Take 325 mg by mouth every 4 (four) hours as needed for pain.    ALUM HYDROXIDE-MAG TRISILICATE (GAVISCON) 80-14.2 MG CHEW    Chew 2 tablets by mouth every 4 (four) hours as needed (for indigestion).   AMLODIPINE  (NORVASC) 10 MG TABLET    Take 10 mg by mouth daily. Reported on 12/29/2015   ASPIRIN 81 MG TABLET    Take 81 mg by mouth daily.   ATORVASTATIN (LIPITOR) 80 MG TABLET    Take 80 mg by mouth daily.   B COMPLEX-VITAMIN C-FOLIC ACID (NEPHRO-VITE) 0.8 MG TABS    Take 0.8 mg by mouth at bedtime.   CALCITRIOL (ROCALTROL) 0.25 MCG CAPSULE    Take 0.25 mcg by mouth 3 (three) times a week. On Monday; Wednesday; Friday   CLOBETASOL CREAM (TEMOVATE) 0.05 %    Apply 1 application topically every 4 (four) hours as needed.   CLONIDINE (CATAPRES - DOSED IN MG/24 HR) 0.2 MG/24HR PATCH    Place 1 patch onto the skin once a week. Wednesday   DOCUSATE SODIUM (COLACE) 100 MG CAPSULE    Take 100 mg by mouth 2 (two) times daily.   FISH OIL-OMEGA-3 FATTY ACIDS 1000 MG CAPSULE    Take 1 g by mouth daily.   FUROSEMIDE (LASIX) 40 MG TABLET    Take 40 mg by mouth daily.  GUAIFENESIN (ROBITUSSIN) 100 MG/5ML SOLN    Take 10 mLs by mouth every 6 (six) hours as needed for cough or to loosen phlegm.   HYDRALAZINE (APRESOLINE) 50 MG TABLET    Take 1 tablet (50 mg total) by mouth 4 (four) times daily.   INSULIN GLARGINE (TOUJEO SOLOSTAR) 300 UNIT/ML SOPN    Inject 70 Units into the skin daily.   INSULIN LISPRO (HUMALOG KWIKPEN) 100 UNIT/ML KIWKPEN    Inject into the skin. Per sliding scale 0-149= 0 units, 150+ = 10 units subcutaneously before meals for diabetes   LINAGLIPTIN (TRADJENTA) 5 MG TABS TABLET    Take 5 mg by mouth daily.   METOPROLOL (LOPRESSOR) 100 MG TABLET    Take 100 mg by mouth 2 (two) times daily.   POLYETHYL GLYCOL-PROPYL GLYCOL (SYSTANE) 0.4-0.3 % SOLN    Apply 1 drop to eye daily as needed. Both eyes   TACROLIMUS (PROTOPIC) 0.1 % OINTMENT    Apply topically every 8 (eight) hours as needed.  Modified Medications   No medications on file  Discontinued Medications   No medications on file     SIGNIFICANT DIAGNOSTIC EXAMS   01-05-15: EKG: SR with first degree AVB   08-25-15: chest x-ray: Left lower lobe  atelectasis.  No superimposed acute abnormality.  LABS REVIEWED:   10-31-15: chol 93; ldl 39; trig 139; hdl 26  4-09-813-24-17: wbc 5.9; hgb 10.2; hct 32.4 mcv 82.9; plt 192; glucose 112; bun 46; create 2.43; k+ 4.4; na++144; liver normal albumin 3.5; phos 3.9; ferritin 378; iron 39; tibc 180 02-01-16: hgb a1c 7.4  04-30-16: hgb a1c 6.5  06-18-16: glucose 101; bun 24; creat 2.24; k+ 4.4; na++ 142  07-02-16: wbc 7.9; hgb 10.4; ht 32.5 ;mcv 81.7 plt 223; glucose 83; bun 34; creat 2.34; k+ 4.7; na++ 143; liver normal albumin 3.7; chol 88; ldl 36; trig 136; hdl 25  19-14-7809-08-07; hgb a1c 6.8 09-02-16: urine micro-albumin: 1.2   Review of Systems Constitutional: Negative for malaise/fatigue.  Respiratory: Negative for  shortness of breath.  has dry cough with hoarseness  Cardiovascular: Negative for chest pain and palpitations.  Gastrointestinal: Negative for heartburn, abdominal pain and constipation.  Musculoskeletal: Negative for myalgias and joint pain.  Skin:no concerns  Neurological: Negative for weakness and headaches.  Psychiatric/Behavioral: The patient is not nervous/anxious.    Physical Exam Constitutional: He is oriented to person, place, and time. He appears well-developed and well-nourished. No distress.  Obese   Neck: Neck supple. No JVD present. No thyromegaly present.  Cardiovascular: Normal rate and regular rhythm.   Respiratory: Effort normal and breath sounds normal. No respiratory distress.  GI: Soft. Bowel sounds are normal. He exhibits no distension. There is no tenderness.  Musculoskeletal:  Bilateral bka;  Is able to move upper extremities   Neurological: He is alert and oriented to person, place, and time.  Skin: Skin is warm and dry. He is not diaphoretic.           ASSESSMENT/ PLAN:  1. Diabetes: will continue tuojeo 70 units daily; novolog 10 units with meals for cbg >=150; tradjenta 5 mg daily;  will monitor his hgb a1c is 6.8  2. Hypertensive renal disease: will  continue norvasc 10 mg daily; lopressor 100 mg twice daily; clonidine 0.2 mg patch changed weekly;  hydralazine 50 mg four times daily;   3. Dyslipidemia: will continue lipitor 80 mg daily; fish oil 1 gm daily his ldl is 39   4. 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 Diabetic retinopathy: is on systane eye drop to both eyes is followed by eye doctor   6. Osteoarthritis: is stable; is presently not on medications; will continue to monitor her status.    7. Constipation: will continue colace twice daily   8. CAD: is stable no complaint of chest pain present; will continue lopressor 100 mg twice daily    9. Allergic rhinitis: will begin claritin 10 mg daily and will monitor     Synthia Innocent NP [facility]  Contact [phone] Monday through Friday 8am- 5pm  After hours call [phone]

## 2016-12-10 DIAGNOSIS — D649 Anemia, unspecified: Secondary | ICD-10-CM | POA: Diagnosis not present

## 2016-12-10 DIAGNOSIS — N19 Unspecified kidney failure: Secondary | ICD-10-CM | POA: Diagnosis not present

## 2016-12-10 LAB — BASIC METABOLIC PANEL
BUN: 16 mg/dL (ref 4–21)
Creatinine: 2.4 mg/dL — AB (ref 0.6–1.3)
GLUCOSE: 54 mg/dL
Potassium: 4.7 mmol/L (ref 3.4–5.3)
Sodium: 144 mmol/L (ref 137–147)

## 2016-12-10 LAB — CBC AND DIFFERENTIAL
HEMATOCRIT: 33 % — AB (ref 41–53)
HEMATOCRIT: 33 % — AB (ref 41–53)
HEMOGLOBIN: 10.2 g/dL — AB (ref 13.5–17.5)
Hemoglobin: 10.2 g/dL — AB (ref 13.5–17.5)
NEUTROS ABS: 5 /uL
PLATELETS: 155 10*3/uL (ref 150–399)
Platelets: 155 10*3/uL (ref 150–399)
WBC: 7.6 10*3/mL
WBC: 7.6 10^3/mL

## 2016-12-10 LAB — HEPATIC FUNCTION PANEL
ALT: 21 U/L (ref 10–40)
AST: 16 U/L (ref 14–40)
Alkaline Phosphatase: 80 U/L (ref 25–125)
Bilirubin, Total: 0.4 mg/dL

## 2016-12-25 ENCOUNTER — Non-Acute Institutional Stay (SKILLED_NURSING_FACILITY): Payer: Medicare Other | Admitting: Internal Medicine

## 2016-12-25 ENCOUNTER — Encounter: Payer: Self-pay | Admitting: Internal Medicine

## 2016-12-25 DIAGNOSIS — Z89511 Acquired absence of right leg below knee: Secondary | ICD-10-CM

## 2016-12-25 DIAGNOSIS — L309 Dermatitis, unspecified: Secondary | ICD-10-CM

## 2016-12-25 DIAGNOSIS — E1122 Type 2 diabetes mellitus with diabetic chronic kidney disease: Secondary | ICD-10-CM | POA: Diagnosis not present

## 2016-12-25 DIAGNOSIS — M150 Primary generalized (osteo)arthritis: Secondary | ICD-10-CM | POA: Diagnosis not present

## 2016-12-25 DIAGNOSIS — N183 Chronic kidney disease, stage 3 (moderate): Secondary | ICD-10-CM | POA: Diagnosis not present

## 2016-12-25 DIAGNOSIS — E785 Hyperlipidemia, unspecified: Secondary | ICD-10-CM

## 2016-12-25 DIAGNOSIS — Z89512 Acquired absence of left leg below knee: Secondary | ICD-10-CM | POA: Diagnosis not present

## 2016-12-25 DIAGNOSIS — E1121 Type 2 diabetes mellitus with diabetic nephropathy: Secondary | ICD-10-CM

## 2016-12-25 DIAGNOSIS — E1169 Type 2 diabetes mellitus with other specified complication: Secondary | ICD-10-CM

## 2016-12-25 DIAGNOSIS — M159 Polyosteoarthritis, unspecified: Secondary | ICD-10-CM

## 2016-12-25 NOTE — Progress Notes (Signed)
Patient ID: Marc Wheeler, male   DOB: 11/21/1940, 76 y.o.   MRN: [id number]    DATE: 12/25/2016  [facility]    [facility] Room Number: 123 A Place of Service: SNF (31)   Extended Emergency Contact Information Primary Emergency Contact: Edgett,Hubbard Lyn HenriWesley  [place] Home Phone: [phone] Relation: Son Secondary Emergency Contact: Westley Footsobson,Michael  [place] Home Phone: [phone] Relation: Other  Advanced Directive information Does Patient Have a Medical Advance Directive?: No, Would patient like information on creating a medical advance directive?: No - Patient declined  Chief Complaint  Patient presents with  . Medical Management of Chronic Issues    Routine Visit- [facility]    HPI:  76 yo male long term resident seen today for f/u. He reports rash on right stump is intermittent and he uses cream for 2 days only each time. No f/c. Appetite some what reduced as food at facility does not appear appetizing. No recent fall. Sleeps ok. No nursing concerns.   DM - controlled. A1c 6.8%. He takes tuojeo 70 units daily; novolog 10 units with meals for cbg >=150; tradjenta 5 mg daily. He has CKD, retinopathy  HTN - BP stable on norvasc 10 mg daily; lopressor 100 mg twice daily; clonidine 0.2 mg patch changed weekly;  hydralazine 50 mg four times daily;   Dyslipidemia - stable on lipitor 80 mg daily; fish oil 1 gm daily. LDL 39   CKD - stage 3. Cr 2.34. Associated with DM. He takes calcitriol 0.25 mcg three times weekly. He is on a 1200 cc fluid restriction. Followed by nephrology   Diabetic retinopathy - stable. He uses systane eye drop to both eyes; followed by eye doctor   Osteoarthritis - stable. Joint pain manageable  Constipation - stable on colace twice daily   CAD - asymptomatic at this time. He takes lopressor 100 mg twice daily    Allergic rhinitis - stable on claritin 10 mg daily  Past Medical History:  Diagnosis Date  .  Chronic kidney disease   . Depression   . Diabetes mellitus without complication (HCC)   . Diabetic retinopathy (HCC)   . Edema   . Hyperlipidemia   . Hyperlipidemia LDL goal <100 07/16/2014  . Hypertension     Past Surgical History:  Procedure Laterality Date  . LEG AMPUTATION BELOW KNEE Bilateral   . right retinal detachment repair  1 22 14     Patient Care Team: Kirt BoysMonica Ashe Gago, DO as PCP - General (Internal Medicine) [facility]  Social History   Social History  . Marital status: Widowed    Spouse name: N/A  . Number of children: N/A  . Years of education: N/A   Occupational History  . Not on file.   Social History Main Topics  . Smoking status: Former Games developermoker  . Smokeless tobacco: Never Used  . Alcohol use No  . Drug use: No  . Sexual activity: No   Other Topics Concern  . Not on file   Social History Narrative  . No narrative on file     reports that he has quit smoking. He has never used smokeless tobacco. He reports that he does not drink alcohol or use drugs.  History reviewed. No pertinent family history. No family status information on file.    Immunization History  Administered Date(s) Administered  . Influenza-Unspecified 08/04/2014, 07/15/2015  . PPD Test 05/18/2011, 05/25/2011, 01/03/2015    No Known Allergies  Medications: Patient's Medications  New Prescriptions   No medications on file  Previous Medications   ACETAMINOPHEN (TYLENOL) 325 MG TABLET    Take 325 mg by mouth every 4 (four) hours as needed for pain.    ALUM HYDROXIDE-MAG TRISILICATE (GAVISCON) 80-14.2 MG CHEW    Chew 2 tablets by mouth every 4 (four) hours as needed (for indigestion).   AMLODIPINE (NORVASC) 10 MG TABLET    Take 10 mg by mouth daily. Reported on 12/29/2015   ASPIRIN 81 MG TABLET    Take 81 mg by mouth daily.   ATORVASTATIN (LIPITOR) 80 MG TABLET    Take 80 mg by mouth daily.   B COMPLEX-VITAMIN C-FOLIC ACID  (NEPHRO-VITE) 0.8 MG TABS    Take 0.8 mg by mouth at bedtime.   CALCITRIOL (ROCALTROL) 0.25 MCG CAPSULE    Take 0.25 mcg by mouth 3 (three) times a week. On Monday; Wednesday; Friday   CLOBETASOL CREAM (TEMOVATE) 0.05 %    Apply 1 application topically every 4 (four) hours as needed.   CLONIDINE (CATAPRES - DOSED IN MG/24 HR) 0.2 MG/24HR PATCH    Place 1 patch onto the skin once a week. Wednesday   DOCUSATE SODIUM (COLACE) 100 MG CAPSULE    Take 100 mg by mouth 2 (two) times daily.   FISH OIL-OMEGA-3 FATTY ACIDS 1000 MG CAPSULE    Take 1 g by mouth daily.   FUROSEMIDE (LASIX) 40 MG TABLET    Take 40 mg by mouth daily.   GUAIFENESIN (ROBITUSSIN) 100 MG/5ML SOLN    Take 10 mLs by mouth every 6 (six) hours as needed for cough or to loosen phlegm.   HYDRALAZINE (APRESOLINE) 50 MG TABLET    Take 1 tablet (50 mg total) by mouth 4 (four) times daily.   INSULIN GLARGINE (TOUJEO SOLOSTAR) 300 UNIT/ML SOPN    Inject 65 Units into the skin at bedtime.   INSULIN LISPRO (HUMALOG KWIKPEN) 100 UNIT/ML KIWKPEN    Inject into the skin. Per sliding scale 0-149= 0 units, 150+ = 10 units subcutaneously before meals for diabetes   LINAGLIPTIN (TRADJENTA) 5 MG TABS TABLET    Take 5 mg by mouth daily.   METOPROLOL (LOPRESSOR) 100 MG TABLET    Take 100 mg by mouth 2 (two) times daily.   POLYETHYL GLYCOL-PROPYL GLYCOL (SYSTANE) 0.4-0.3 % SOLN    Apply 1 drop to eye daily as needed. Both eyes   TACROLIMUS (PROTOPIC) 0.1 % OINTMENT    Apply topically every 8 (eight) hours as needed.  Modified Medications   No medications on file  Discontinued Medications   INSULIN GLARGINE (TOUJEO SOLOSTAR) 300 UNIT/ML SOPN    Inject 70 Units into the skin daily.    Review of Systems  Constitutional: Positive for appetite change.  Skin: Positive for rash.  All other systems reviewed and are negative.   Vitals:   12/25/16 1219  BP: 136/64  Pulse: 72  Resp: 18  Temp: 98.2 F (36.8 C)  TempSrc: Oral  SpO2: 98%  Weight: 189 lb  9.6 oz (86 kg)  Height: 4\' 2"  (1.27 m)   Body mass index is 53.32 kg/m.  Physical Exam  Constitutional: He is oriented to person, place, and time. He appears well-developed and well-nourished.  Frail appearing sitting in w/c in NAD  HENT:  Mouth/Throat: Oropharynx is clear and moist.  Eyes: Pupils are equal, round, and reactive to light. No scleral icterus.  Neck: Neck supple. Carotid bruit is not present. No thyromegaly  present.  Cardiovascular: Normal rate, regular rhythm and intact distal pulses.  Exam reveals no gallop and no friction rub.   Murmur (1/6 SEM) heard. Pulses:      Right dorsalis pedis pulse not accessible and left dorsalis pedis pulse not accessible.       Right posterior tibial pulse not accessible and left posterior tibial pulse not accessible.  R>L trace edema of BKA stump  Pulmonary/Chest: Effort normal and breath sounds normal. He has no wheezes. He has no rales. He exhibits no tenderness.  Abdominal: Soft. Bowel sounds are normal. He exhibits no distension, no abdominal bruit, no pulsatile midline mass and no mass. There is no tenderness. There is no rebound and no guarding.  Musculoskeletal: He exhibits edema and deformity (b/l BKA).  B/l BKA  Lymphadenopathy:    He has no cervical adenopathy.  Neurological: He is alert and oriented to person, place, and time.  Skin: Skin is warm and dry. Rash noted. Rash is papular (papulosquamous patchy red rash with maceration on right BKA stump with no signs of secondary infection). Rash is not vesicular.  No d/c  Psychiatric: He has a normal mood and affect. His behavior is normal. Thought content normal.     Labs reviewed: Nursing Home on 12/25/2016  Component Date Value Ref Range Status  . Microalb, Ur 09/03/2016 1.2   Final  . Hemoglobin 12/10/2016 10.2* 13.5 - 17.5 g/dL Final  . HCT 16/07/9603 33* 41 - 53 % Final  . Platelets 12/10/2016 155  150 - 399 K/L Final  . WBC 12/10/2016 7.6  10^3/mL Final    No  results found.   Assessment/Plan   ICD-9-CM ICD-10-CM   1. Dermatitis 692.9 L30.9    recurrent; right BKA; steroid responsive - mildly exacerbated  2. Type II diabetes mellitus with nephropathy (HCC) 250.40 E11.21    583.81    3. Primary osteoarthritis involving multiple joints 715.09 M15.0   4. CKD stage 3 due to type 2 diabetes mellitus (HCC) 250.40 E11.22    585.3 N18.3   5. Dyslipidemia associated with type 2 diabetes mellitus (HCC) 250.80 E11.69    272.4 E78.5   6. S/P bilateral BKA (below knee amputation) (HCC) V49.75 Z89.512     Z89.511    Use steroid cream as directed x 2 weeks then use prn. Explained to pt that he needs to use steroid cream for 2 weeks for each exacerbation in order for it to heal appropriately  Cont other meds as ordered  f/u with specialists as scheduled  Cont fluid restriction 1200cc/day  Will follow  Hema Lanza S. Ancil Linsey  [facility] [address] [place], [place] [address] [phone] Cell (Monday-Friday 8 AM - 5 PM) [phone] After 5 PM and follow prompts

## 2017-02-06 DIAGNOSIS — R809 Proteinuria, unspecified: Secondary | ICD-10-CM | POA: Diagnosis not present

## 2017-02-06 DIAGNOSIS — E1165 Type 2 diabetes mellitus with hyperglycemia: Secondary | ICD-10-CM | POA: Diagnosis not present

## 2017-02-06 DIAGNOSIS — N183 Chronic kidney disease, stage 3 (moderate): Secondary | ICD-10-CM | POA: Diagnosis not present

## 2017-02-06 DIAGNOSIS — E1129 Type 2 diabetes mellitus with other diabetic kidney complication: Secondary | ICD-10-CM | POA: Diagnosis not present

## 2017-02-06 DIAGNOSIS — I129 Hypertensive chronic kidney disease with stage 1 through stage 4 chronic kidney disease, or unspecified chronic kidney disease: Secondary | ICD-10-CM | POA: Diagnosis not present

## 2017-02-06 DIAGNOSIS — D638 Anemia in other chronic diseases classified elsewhere: Secondary | ICD-10-CM | POA: Diagnosis not present

## 2017-02-06 DIAGNOSIS — N2581 Secondary hyperparathyroidism of renal origin: Secondary | ICD-10-CM | POA: Diagnosis not present

## 2017-02-09 DIAGNOSIS — Z79899 Other long term (current) drug therapy: Secondary | ICD-10-CM | POA: Diagnosis not present

## 2017-02-09 DIAGNOSIS — E119 Type 2 diabetes mellitus without complications: Secondary | ICD-10-CM | POA: Diagnosis not present

## 2017-02-09 DIAGNOSIS — D649 Anemia, unspecified: Secondary | ICD-10-CM | POA: Diagnosis not present

## 2017-02-09 DIAGNOSIS — D631 Anemia in chronic kidney disease: Secondary | ICD-10-CM | POA: Diagnosis not present

## 2017-02-09 LAB — HEMOGLOBIN A1C: Hemoglobin A1C: 7.2

## 2017-03-26 ENCOUNTER — Encounter: Payer: Self-pay | Admitting: Internal Medicine

## 2017-03-26 ENCOUNTER — Non-Acute Institutional Stay (SKILLED_NURSING_FACILITY): Payer: Medicare Other | Admitting: Internal Medicine

## 2017-03-26 DIAGNOSIS — N183 Chronic kidney disease, stage 3 unspecified: Secondary | ICD-10-CM

## 2017-03-26 DIAGNOSIS — E1122 Type 2 diabetes mellitus with diabetic chronic kidney disease: Secondary | ICD-10-CM

## 2017-03-26 DIAGNOSIS — L309 Dermatitis, unspecified: Secondary | ICD-10-CM | POA: Diagnosis not present

## 2017-03-26 DIAGNOSIS — Z89512 Acquired absence of left leg below knee: Secondary | ICD-10-CM

## 2017-03-26 DIAGNOSIS — Z794 Long term (current) use of insulin: Secondary | ICD-10-CM

## 2017-03-26 DIAGNOSIS — I10 Essential (primary) hypertension: Secondary | ICD-10-CM

## 2017-03-26 DIAGNOSIS — M159 Polyosteoarthritis, unspecified: Secondary | ICD-10-CM

## 2017-03-26 DIAGNOSIS — E1121 Type 2 diabetes mellitus with diabetic nephropathy: Secondary | ICD-10-CM

## 2017-03-26 DIAGNOSIS — E1169 Type 2 diabetes mellitus with other specified complication: Secondary | ICD-10-CM

## 2017-03-26 DIAGNOSIS — M150 Primary generalized (osteo)arthritis: Secondary | ICD-10-CM | POA: Diagnosis not present

## 2017-03-26 DIAGNOSIS — E785 Hyperlipidemia, unspecified: Secondary | ICD-10-CM

## 2017-03-26 DIAGNOSIS — Z89511 Acquired absence of right leg below knee: Secondary | ICD-10-CM

## 2017-03-26 NOTE — Progress Notes (Signed)
Patient ID: Marc Wheeler, male   DOB: [birth year], 76 y.o.   MRN: [id number]    DATE: 03/26/2017  Location:    [facility] Room Number: 123 A Place of Service: SNF (31)   Extended Emergency Contact Information Primary Emergency Contact: Blincoe,Eagan Lyn Henri [place] Home Phone: [phone] Relation: Son Secondary Emergency Contact: Westley Foots [place] Home Phone: [phone] Relation: Other  Advanced Directive information Does Patient Have a Medical Advance Directive?: No (Full Code), Would patient like information on creating a medical advance directive?: No - Patient declined  Chief Complaint  Patient presents with  . Medical Management of Chronic Issues    Routine Visit [facility]    HPI:  76 yo male long term resident seen today for f/u. He has no c/o. Appetite ok most days. Sleeps well. No recent exacerbation of thigh rash. No f/c. No nursing issues. No recent fall.  DM - controlled. A1c 7.2%. He takes tuojeo 65 units daily; novolog 10 units with meals for cbg >=150; tradjenta 5 mg daily. He has CKD, retinopathy  HTN - BP stable on norvasc 10 mg daily; lopressor 100 mg twice daily; clonidine 0.2 mg patch changed weekly;  hydralazine 50 mg four times daily;   Dyslipidemia - stable on lipitor 80 mg daily; fish oil 1 gm daily. LDL 39   CKD - stage 3. Cr 2.34. Associated with DM. He takes calcitriol 0.25 mcg three times weekly. He is on a 1200 cc fluid restriction. Followed by nephrology   Diabetic retinopathy - stable. He uses systane eye drop to both eyes; followed by eye doctor   Osteoarthritis - stable. Joint pain manageable  Constipation - stable on colace twice daily   CAD - asymptomatic at this time. He takes lopressor 100 mg twice daily    Allergic rhinitis - stable on claritin 10 mg daily  Dermatitis - stable on clobetasol cream  Past Medical History:  Diagnosis Date  . Chronic kidney disease   . Depression    . Diabetes mellitus without complication (HCC)   . Diabetic retinopathy (HCC)   . Edema   . Hyperlipidemia   . Hyperlipidemia LDL goal <100 07/16/2014  . Hypertension     Past Surgical History:  Procedure Laterality Date  . LEG AMPUTATION BELOW KNEE Bilateral   . right retinal detachment repair  1 22 14     Patient Care Team: Kirt Boys, DO as PCP - General (Internal Medicine) Center, [facility])  Social History   Social History  . Marital status: Widowed    Spouse name: N/A  . Number of children: N/A  . Years of education: N/A   Occupational History  . Not on file.   Social History Main Topics  . Smoking status: Former Games developer  . Smokeless tobacco: Never Used  . Alcohol use No  . Drug use: No  . Sexual activity: No   Other Topics Concern  . Not on file   Social History Narrative  . No narrative on file     reports that he has quit smoking. He has never used smokeless tobacco. He reports that he does not drink alcohol or use drugs.  History reviewed. No pertinent family history. No family status information on file.    Immunization History  Administered Date(s) Administered  . Influenza-Unspecified 08/04/2014, 07/15/2015  . PPD Test 05/18/2011, 05/25/2011, 01/03/2015    No Known Allergies  Medications: Patient's Medications  New Prescriptions  No medications on file  Previous Medications   ACETAMINOPHEN (TYLENOL) 325 MG TABLET    Take 325 mg by mouth every 4 (four) hours as needed for pain.    ALUM HYDROXIDE-MAG TRISILICATE (GAVISCON) 80-14.2 MG CHEW    Chew 2 tablets by mouth every 4 (four) hours as needed (for indigestion).   AMLODIPINE (NORVASC) 10 MG TABLET    Take 10 mg by mouth daily. Reported on 12/29/2015   ASPIRIN 81 MG TABLET    Take 81 mg by mouth daily.   ATORVASTATIN (LIPITOR) 80 MG TABLET    Take 80 mg by mouth daily.   B COMPLEX-VITAMIN C-FOLIC ACID (NEPHRO-VITE) 0.8 MG TABS    Take 0.8 mg by mouth  at bedtime.   CALCITRIOL (ROCALTROL) 0.25 MCG CAPSULE    Take 0.25 mcg by mouth 3 (three) times a week. On Monday; Wednesday; Friday   CHOLECALCIFEROL (VITAMIN D) 1000 UNITS TABLET    Take 1,000 Units by mouth daily.   CLOBETASOL CREAM (TEMOVATE) 0.05 %    Apply 1 application topically every 4 (four) hours as needed.   CLONIDINE (CATAPRES - DOSED IN MG/24 HR) 0.2 MG/24HR PATCH    Place 1 patch onto the skin once a week. Wednesday   DOCUSATE SODIUM (COLACE) 100 MG CAPSULE    Take 100 mg by mouth 2 (two) times daily.   FISH OIL-OMEGA-3 FATTY ACIDS 1000 MG CAPSULE    Take 1 g by mouth daily.   FUROSEMIDE (LASIX) 40 MG TABLET    Take 40 mg by mouth daily.   GUAIFENESIN (ROBITUSSIN) 100 MG/5ML SOLN    Take 10 mLs by mouth every 6 (six) hours as needed for cough or to loosen phlegm.   HYDRALAZINE (APRESOLINE) 50 MG TABLET    Take 1 tablet (50 mg total) by mouth 4 (four) times daily.   INSULIN GLARGINE (TOUJEO SOLOSTAR) 300 UNIT/ML SOPN    Inject 65 Units into the skin at bedtime.   INSULIN LISPRO (HUMALOG KWIKPEN) 100 UNIT/ML KIWKPEN    Inject into the skin. Per sliding scale 0-149= 0 units, 150+ = 10 units subcutaneously before meals for diabetes   LINAGLIPTIN (TRADJENTA) 5 MG TABS TABLET    Take 5 mg by mouth daily.   LORATADINE (CLARITIN) 10 MG TABLET    Take 10 mg by mouth daily as needed for allergies.   METOPROLOL (LOPRESSOR) 100 MG TABLET    Take 100 mg by mouth 2 (two) times daily.   POLYETHYL GLYCOL-PROPYL GLYCOL (SYSTANE) 0.4-0.3 % SOLN    Apply 1 drop to eye daily as needed. Both eyes  Modified Medications   No medications on file  Discontinued Medications   TACROLIMUS (PROTOPIC) 0.1 % OINTMENT    Apply topically every 8 (eight) hours as needed.    Review of Systems  Constitutional: Negative for appetite change.  Musculoskeletal: Positive for gait problem.  Skin: Negative for rash.  All other systems reviewed and are negative.   Vitals:   03/26/17 1428  BP: 120/76  Pulse: 86    Resp: 18  Temp: 97 F (36.1 C)  TempSrc: Oral  SpO2: 97%  Weight: 179 lb 6.4 oz (81.4 kg)  Height: 4\' 2"  (1.27 m)   Body mass index is 50.45 kg/m.  Physical Exam  Constitutional: He is oriented to person, place, and time. He appears well-developed and well-nourished.  Sitting in w/c in NAD  HENT:  Mouth/Throat: Oropharynx is clear and moist.  Eyes: Pupils are equal, round, and reactive to  light. No scleral icterus.  Neck: Neck supple. Carotid bruit is not present. No thyromegaly present.  Cardiovascular: Normal rate, regular rhythm and intact distal pulses.  Exam reveals no gallop and no friction rub.   Murmur (1/6 SEM) heard. Pulses:      Right dorsalis pedis pulse not accessible and left dorsalis pedis pulse not accessible.       Right posterior tibial pulse not accessible and left posterior tibial pulse not accessible.  R>L trace edema of BKA stump  Pulmonary/Chest: Effort normal and breath sounds normal. He has no wheezes. He has no rales. He exhibits no tenderness.  Abdominal: Soft. Bowel sounds are normal. He exhibits no distension, no abdominal bruit, no pulsatile midline mass and no mass. There is no tenderness. There is no rebound and no guarding.  Musculoskeletal: He exhibits edema and deformity (b/l BKA).  B/l BKA  Lymphadenopathy:    He has no cervical adenopathy.  Neurological: He is alert and oriented to person, place, and time.  Skin: Skin is warm and dry. No rash noted. Rash is not vesicular.  Psychiatric: He has a normal mood and affect. His behavior is normal. Thought content normal.     Labs reviewed: Nursing Home on 03/26/2017  Component Date Value Ref Range Status  . Hemoglobin A1C 02/09/2017 7.2   Final  Abstract on 01/07/2017  Component Date Value Ref Range Status  . Hemoglobin 12/10/2016 10.2* 13.5 - 17.5 g/dL Final  . HCT 40/98/1191 33* 41 - 53 % Final  . Neutrophils Absolute 12/10/2016 5  /L Final  . Platelets 12/10/2016 155  150 - 399 K/L  Final  . WBC 12/10/2016 7.6  10^3/mL Final  . Glucose 12/10/2016 54  mg/dL Final  . BUN 47/82/9562 16  4 - 21 mg/dL Final  . Creatinine 13/05/6577 2.4* 0.6 - 1.3 mg/dL Final  . Potassium 46/96/2952 4.7  3.4 - 5.3 mmol/L Final  . Sodium 12/10/2016 144  137 - 147 mmol/L Final  . Alkaline Phosphatase 12/10/2016 80  25 - 125 U/L Final  . ALT 12/10/2016 21  10 - 40 U/L Final  . AST 12/10/2016 16  14 - 40 U/L Final  . Bilirubin, Total 12/10/2016 0.4  mg/dL Final  Nursing Home on 12/25/2016  Component Date Value Ref Range Status  . Microalb, Ur 09/03/2016 1.2   Final  . Hemoglobin 12/10/2016 10.2* 13.5 - 17.5 g/dL Final  . HCT 84/13/2440 33* 41 - 53 % Final  . Platelets 12/10/2016 155  150 - 399 K/L Final  . WBC 12/10/2016 7.6  10^3/mL Final    No results found.   Assessment/Plan   ICD-10-CM   1. 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 Type 2 diabetes mellitus with stage 3 chronic kidney disease, with long-term current use of insulin (HCC) E11.22    N18.3    Z79.4   3. Dyslipidemia associated with type 2 diabetes mellitus (HCC) E11.69    E78.5   4. S/P bilateral BKA (below knee amputation) (HCC) Z89.512    Z89.511   5. 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 Essential hypertension, benign I10   7. Dermatitis L30.9     Check BMP, ALT, CBC and lipid panel  Cont current meds as ordered  F/u with specialists as scheduled  PT/OT/ST as indicated  [facility] NP to follow  Will follow  Mandy Peeks S. [facility] [phone]  [place], [place] [address] [phone] Cell (Monday-Friday 8 AM - 5 PM) [phone] After 5 PM and follow prompts

## 2017-03-27 DIAGNOSIS — E119 Type 2 diabetes mellitus without complications: Secondary | ICD-10-CM | POA: Diagnosis not present

## 2017-03-27 DIAGNOSIS — E785 Hyperlipidemia, unspecified: Secondary | ICD-10-CM | POA: Diagnosis not present

## 2017-03-27 DIAGNOSIS — D649 Anemia, unspecified: Secondary | ICD-10-CM | POA: Diagnosis not present

## 2017-03-27 DIAGNOSIS — Z79899 Other long term (current) drug therapy: Secondary | ICD-10-CM | POA: Diagnosis not present

## 2017-04-02 ENCOUNTER — Non-Acute Institutional Stay (SKILLED_NURSING_FACILITY): Payer: Medicare Other

## 2017-04-02 DIAGNOSIS — Z0000 Encounter for general adult medical examination without abnormal findings: Secondary | ICD-10-CM | POA: Diagnosis not present

## 2017-04-02 NOTE — Progress Notes (Signed)
Subjective:   Marc Wheeler is a 76 y.o. male who presents for an Initial Medicare Annual Wellness Visit at [facility]    Objective:    Today's Vitals   04/02/17 1521  BP: (!) 132/58  Pulse: 62  Temp: 98.3 F (36.8 C)  TempSrc: Oral  SpO2: 98%  Weight: 179 lb (81.2 kg)  Height: 4\' 2"  (1.27 m)   Body mass index is 50.34 kg/m.  Current Medications (verified) Outpatient Encounter Prescriptions as of 04/02/2017  Medication Sig  . acetaminophen (TYLENOL) 325 MG tablet Take 325 mg by mouth every 4 (four) hours as needed for pain.   Marland Kitchen. Alum Hydroxide-Mag Trisilicate (GAVISCON) 80-14.2 MG CHEW Chew 2 tablets by mouth every 4 (four) hours as needed (for indigestion).  Marland Kitchen. amLODipine (NORVASC) 10 MG tablet Take 10 mg by mouth daily. Reported on 12/29/2015  . aspirin 81 MG tablet Take 81 mg by mouth daily.  Marland Kitchen. atorvastatin (LIPITOR) 80 MG tablet Take 80 mg by mouth daily.  Marland Kitchen. b complex-vitamin c-folic acid (NEPHRO-VITE) 0.8 MG TABS Take 0.8 mg by mouth at bedtime.  . calcitRIOL (ROCALTROL) 0.25 MCG capsule Take 0.25 mcg by mouth 3 (three) times a week. On Monday; Wednesday; Friday  . cholecalciferol (VITAMIN D) 1000 units tablet Take 1,000 Units by mouth daily.  . clobetasol cream (TEMOVATE) 0.05 % Apply 1 application topically every 4 (four) hours as needed.  . cloNIDine (CATAPRES - DOSED IN MG/24 HR) 0.2 mg/24hr patch Place 1 patch onto the skin once a week. Wednesday  . docusate sodium (COLACE) 100 MG capsule Take 100 mg by mouth 2 (two) times daily.  . fish oil-omega-3 fatty acids 1000 MG capsule Take 1 g by mouth daily.  . furosemide (LASIX) 40 MG tablet Take 40 mg by mouth daily.  Marland Kitchen. guaiFENesin (ROBITUSSIN) 100 MG/5ML SOLN Take 10 mLs by mouth every 6 (six) hours as needed for cough or to loosen phlegm.  . hydrALAZINE (APRESOLINE) 50 MG tablet Take 1 tablet (50 mg total) by mouth 4 (four) times daily.  . Insulin Glargine (TOUJEO SOLOSTAR) 300 UNIT/ML SOPN Inject 65 Units  into the skin at bedtime.  . insulin lispro (HUMALOG KWIKPEN) 100 UNIT/ML KiwkPen Inject into the skin. Per sliding scale 0-149= 0 units, 150+ = 10 units subcutaneously before meals for diabetes  . linagliptin (TRADJENTA) 5 MG TABS tablet Take 5 mg by mouth daily.  Marland Kitchen. loratadine (CLARITIN) 10 MG tablet Take 10 mg by mouth daily as needed for allergies.  . metoprolol (LOPRESSOR) 100 MG tablet Take 100 mg by mouth 2 (two) times daily.  Bertram Gala. Polyethyl Glycol-Propyl Glycol (SYSTANE) 0.4-0.3 % SOLN Apply 1 drop to eye daily as needed. Both eyes   No facility-administered encounter medications on file as of 04/02/2017.     Allergies (verified) Patient has no known allergies.   History: Past Medical History:  Diagnosis Date  . Chronic kidney disease   . Depression   . Diabetes mellitus without complication (HCC)   . Diabetic retinopathy (HCC)   . Edema   . Hyperlipidemia   . Hyperlipidemia LDL goal <100 07/16/2014  . Hypertension    Past Surgical History:  Procedure Laterality Date  . LEG AMPUTATION BELOW KNEE Bilateral   . right retinal detachment repair  1 22 14    No family history on file. Social History   Occupational History  . Not on file.   Social History Main Topics  . Smoking status: Former Smoker  Packs/day: 5.00    Years: 45.00    Types: Cigars  . Smokeless tobacco: Never Used  . Alcohol use No  . Drug use: No  . Sexual activity: No   Tobacco Counseling Counseling given: Not Answered   Activities of Daily Living In your present state of health, do you have any difficulty performing the following activities: 04/02/2017  Hearing? N  Vision? N  Difficulty concentrating or making decisions? N  Walking or climbing stairs? Y  Dressing or bathing? N  Doing errands, shopping? Y  Preparing Food and eating ? Y  Using the Toilet? N  In the past six months, have you accidently leaked urine? N  Do you have problems with loss of bowel control? N  Managing your  Medications? Y  Managing your Finances? Y  Housekeeping or managing your Housekeeping? Y  Some recent data might be hidden    Immunizations and Health Maintenance Immunization History  Administered Date(s) Administered  . Influenza-Unspecified 08/04/2014, 07/15/2015  . PPD Test 05/18/2011, 05/25/2011, 01/03/2015   There are no preventive care reminders to display for this patient.  Patient Care Team: Kirt Boys, DO as PCP - General (Internal Medicine) Center, [facility])  Indicate any recent Medical Services you may have received from other than [facility] providers in the past year (date may be approximate).    Assessment:   This is a routine wellness examination for Marc Wheeler.   Hearing/Vision screen No exam data present  Dietary issues and exercise activities discussed: Current Exercise Habits: The patient does not participate in regular exercise at present, Exercise limited by: orthopedic condition(s)  Goals    . Maintain Lifestyle          Pt will maintain lifestyle.      Depression Screen PHQ 2/9 Scores 04/02/2017  PHQ - 2 Score 0    Fall Risk Fall Risk  04/02/2017  Falls in the past year? No    Cognitive Function:     6CIT Screen 04/02/2017  What Year? 0 points  What month? 0 points  What time? 0 points  Count back from 20 0 points  Months in reverse 4 points  Repeat phrase 6 points  Total Score 10    Screening Tests Health Maintenance  Topic Date Due  . FOOT EXAM  10/22/2026 (Originally 01/26/1951)  . OPHTHALMOLOGY EXAM  05/01/2017  . INFLUENZA VACCINE  05/22/2017  . HEMOGLOBIN A1C  08/11/2017        Plan:    I have personally reviewed and addressed the Medicare Annual Wellness questionnaire and have noted the following in the patient's chart:  A. Medical and social history B. Use of alcohol, tobacco or illicit drugs  C. Current medications and supplements D. Functional ability and status E.  Nutritional  status F.  Physical activity G. Advance directives H. List of other physicians I.  Hospitalizations, surgeries, and ER visits in previous 12 months J.  Vitals K. Screenings to include hearing, vision, cognitive, depression L. Referrals and appointments - none  In addition, I have reviewed and discussed with patient certain preventive protocols, quality metrics, and best practice recommendations. A written personalized care plan for preventive services as well as general preventive health recommendations were provided to patient.  See attached scanned questionnaire for additional information.   Signed,   Annetta Maw, RN Nurse Health Advisor   Quick Notes   Health Maintenance: Foot exam due     Abnormal Screen: 6CIT-10  Patient Concerns: None     Nurse Concerns: None

## 2017-04-02 NOTE — Patient Instructions (Signed)
[name] , Thank you for taking time to come for your Medicare Wellness Visit. I appreciate your ongoing commitment to your health goals. Please review the following plan we discussed and let me know if I can assist you in the future.   Screening recommendations/referrals: Colonoscopy long term pt Recommended yearly ophthalmology/optometry visit for glaucoma screening and checkup Recommended yearly dental visit for hygiene and checkup  Vaccinations: Influenza vaccine due when availabe Pneumococcal vaccine up to date Tdap vaccine not in records Shingles vaccine not in records  Advanced directives: DNR in chart  Conditions/risks identified: None  Next appointment: None upcoming  Preventive Care 65 Years and Older, Male Preventive care refers to lifestyle choices and visits with your health care provider that can promote health and wellness. What does preventive care include?  A yearly physical exam. This is also called an annual well check.  Dental exams once or twice a year.  Routine eye exams. Ask your health care provider how often you should have your eyes checked.  Personal lifestyle choices, including:  Daily care of your teeth and gums.  Regular physical activity.  Eating a healthy diet.  Avoiding tobacco and drug use.  Limiting alcohol use.  Practicing safe sex.  Taking low doses of aspirin every day.  Taking vitamin and mineral supplements as recommended by your health care provider. What happens during an annual well check? The services and screenings done by your health care provider during your annual well check will depend on your age, overall health, lifestyle risk factors, and family history of disease. Counseling  Your health care provider may ask you questions about your:  Alcohol use.  Tobacco use.  Drug use.  Emotional well-being.  Home and relationship well-being.  Sexual activity.  Eating habits.  History of falls.  Memory and  ability to understand (cognition).  Work and work Astronomerenvironment. Screening  You may have the following tests or measurements:  Height, weight, and BMI.  Blood pressure.  Lipid and cholesterol levels. These may be checked every 5 years, or more frequently if you are over 77 years old.  Skin check.  Lung cancer screening. You may have this screening every year starting at age 77 if you have a 30-pack-year history of smoking and currently smoke or have quit within the past 15 years.  Fecal occult blood test (FOBT) of the stool. You may have this test every year starting at age 77.  Flexible sigmoidoscopy or colonoscopy. You may have a sigmoidoscopy every 5 years or a colonoscopy every 10 years starting at age 77.  Prostate cancer screening. Recommendations will vary depending on your family history and other risks.  Hepatitis C blood test.  Hepatitis B blood test.  Sexually transmitted disease (STD) testing.  Diabetes screening. This is done by checking your blood sugar (glucose) after you have not eaten for a while (fasting). You may have this done every 1-3 years.  Abdominal aortic aneurysm (AAA) screening. You may need this if you are a current or former smoker.  Osteoporosis. You may be screened starting at age 77 if you are at high risk. Talk with your health care provider about your test results, treatment options, and if necessary, the need for more tests. Vaccines  Your health care provider may recommend certain vaccines, such as:  Influenza vaccine. This is recommended every year.  Tetanus, diphtheria, and acellular pertussis (Tdap, Td) vaccine. You may need a Td booster every 10 years.  Zoster vaccine. You may need this  after age 16.  Pneumococcal 13-valent conjugate (PCV13) vaccine. One dose is recommended after age 40.  Pneumococcal polysaccharide (PPSV23) vaccine. One dose is recommended after age 22. Talk to your health care provider about which screenings and  vaccines you need and how often you need them. This information is not intended to replace advice given to you by your health care provider. Make sure you discuss any questions you have with your health care provider. Document Released: 11/04/2015 Document Revised: 06/27/2016 Document Reviewed: 08/09/2015 Elsevier Interactive Patient Education  2017 [place] Prevention in the Home Falls can cause injuries. They can happen to people of all ages. There are many things you can do to make your home safe and to help prevent falls. What can I do on the outside of my home?  Regularly fix the edges of walkways and driveways and fix any cracks.  Remove anything that might make you trip as you walk through a door, such as a raised step or threshold.  Trim any bushes or trees on the path to your home.  Use bright outdoor lighting.  Clear any walking paths of anything that might make someone trip, such as rocks or tools.  Regularly check to see if handrails are loose or broken. Make sure that both sides of any steps have handrails.  Any raised decks and porches should have guardrails on the edges.  Have any leaves, snow, or ice cleared regularly.  Use sand or salt on walking paths during winter.  Clean up any spills in your garage right away. This includes oil or grease spills. What can I do in the bathroom?  Use night lights.  Install grab bars by the toilet and in the tub and shower. Do not use towel bars as grab bars.  Use non-skid mats or decals in the tub or shower.  If you need to sit down in the shower, use a plastic, non-slip stool.  Keep the floor dry. Clean up any water that spills on the floor as soon as it happens.  Remove soap buildup in the tub or shower regularly.  Attach bath mats securely with double-sided non-slip rug tape.  Do not have throw rugs and other things on the floor that can make you trip. What can I do in the bedroom?  Use night  lights.  Make sure that you have a light by your bed that is easy to reach.  Do not use any sheets or blankets that are too big for your bed. They should not hang down onto the floor.  Have a firm chair that has side arms. You can use this for support while you get dressed.  Do not have throw rugs and other things on the floor that can make you trip. What can I do in the kitchen?  Clean up any spills right away.  Avoid walking on wet floors.  Keep items that you use a lot in easy-to-reach places.  If you need to reach something above you, use a strong step stool that has a grab bar.  Keep electrical cords out of the way.  Do not use floor polish or wax that makes floors slippery. If you must use wax, use non-skid floor wax.  Do not have throw rugs and other things on the floor that can make you trip. What can I do with my stairs?  Do not leave any items on the stairs.  Make sure that there are handrails on both sides of the  stairs and use them. Fix handrails that are broken or loose. Make sure that handrails are as long as the stairways.  Check any carpeting to make sure that it is firmly attached to the stairs. Fix any carpet that is loose or worn.  Avoid having throw rugs at the top or bottom of the stairs. If you do have throw rugs, attach them to the floor with carpet tape.  Make sure that you have a light switch at the top of the stairs and the bottom of the stairs. If you do not have them, ask someone to add them for you. What else can I do to help prevent falls?  Wear shoes that:  Do not have high heels.  Have rubber bottoms.  Are comfortable and fit you well.  Are closed at the toe. Do not wear sandals.  If you use a stepladder:  Make sure that it is fully opened. Do not climb a closed stepladder.  Make sure that both sides of the stepladder are locked into place.  Ask someone to hold it for you, if possible.  Clearly mark and make sure that you can  see:  Any grab bars or handrails.  First and last steps.  Where the edge of each step is.  Use tools that help you move around (mobility aids) if they are needed. These include:  Canes.  Walkers.  Scooters.  Crutches.  Turn on the lights when you go into a dark area. Replace any light bulbs as soon as they burn out.  Set up your furniture so you have a clear path. Avoid moving your furniture around.  If any of your floors are uneven, fix them.  If there are any pets around you, be aware of where they are.  Review your medicines with your doctor. Some medicines can make you feel dizzy. This can increase your chance of falling. Ask your doctor what other things that you can do to help prevent falls. This information is not intended to replace advice given to you by your health care provider. Make sure you discuss any questions you have with your health care provider. Document Released: 08/04/2009 Document Revised: 03/15/2016 Document Reviewed: 11/12/2014 Elsevier Interactive Patient Education  2017 [employer].

## 2017-09-03 IMAGING — DX DG CHEST 2V
2 series · 2 of 2 positions shown · non-contrast
Comparison: 01/01/2015

CLINICAL DATA: Cough

EXAM:
CHEST  2 VIEW

[chest lat]
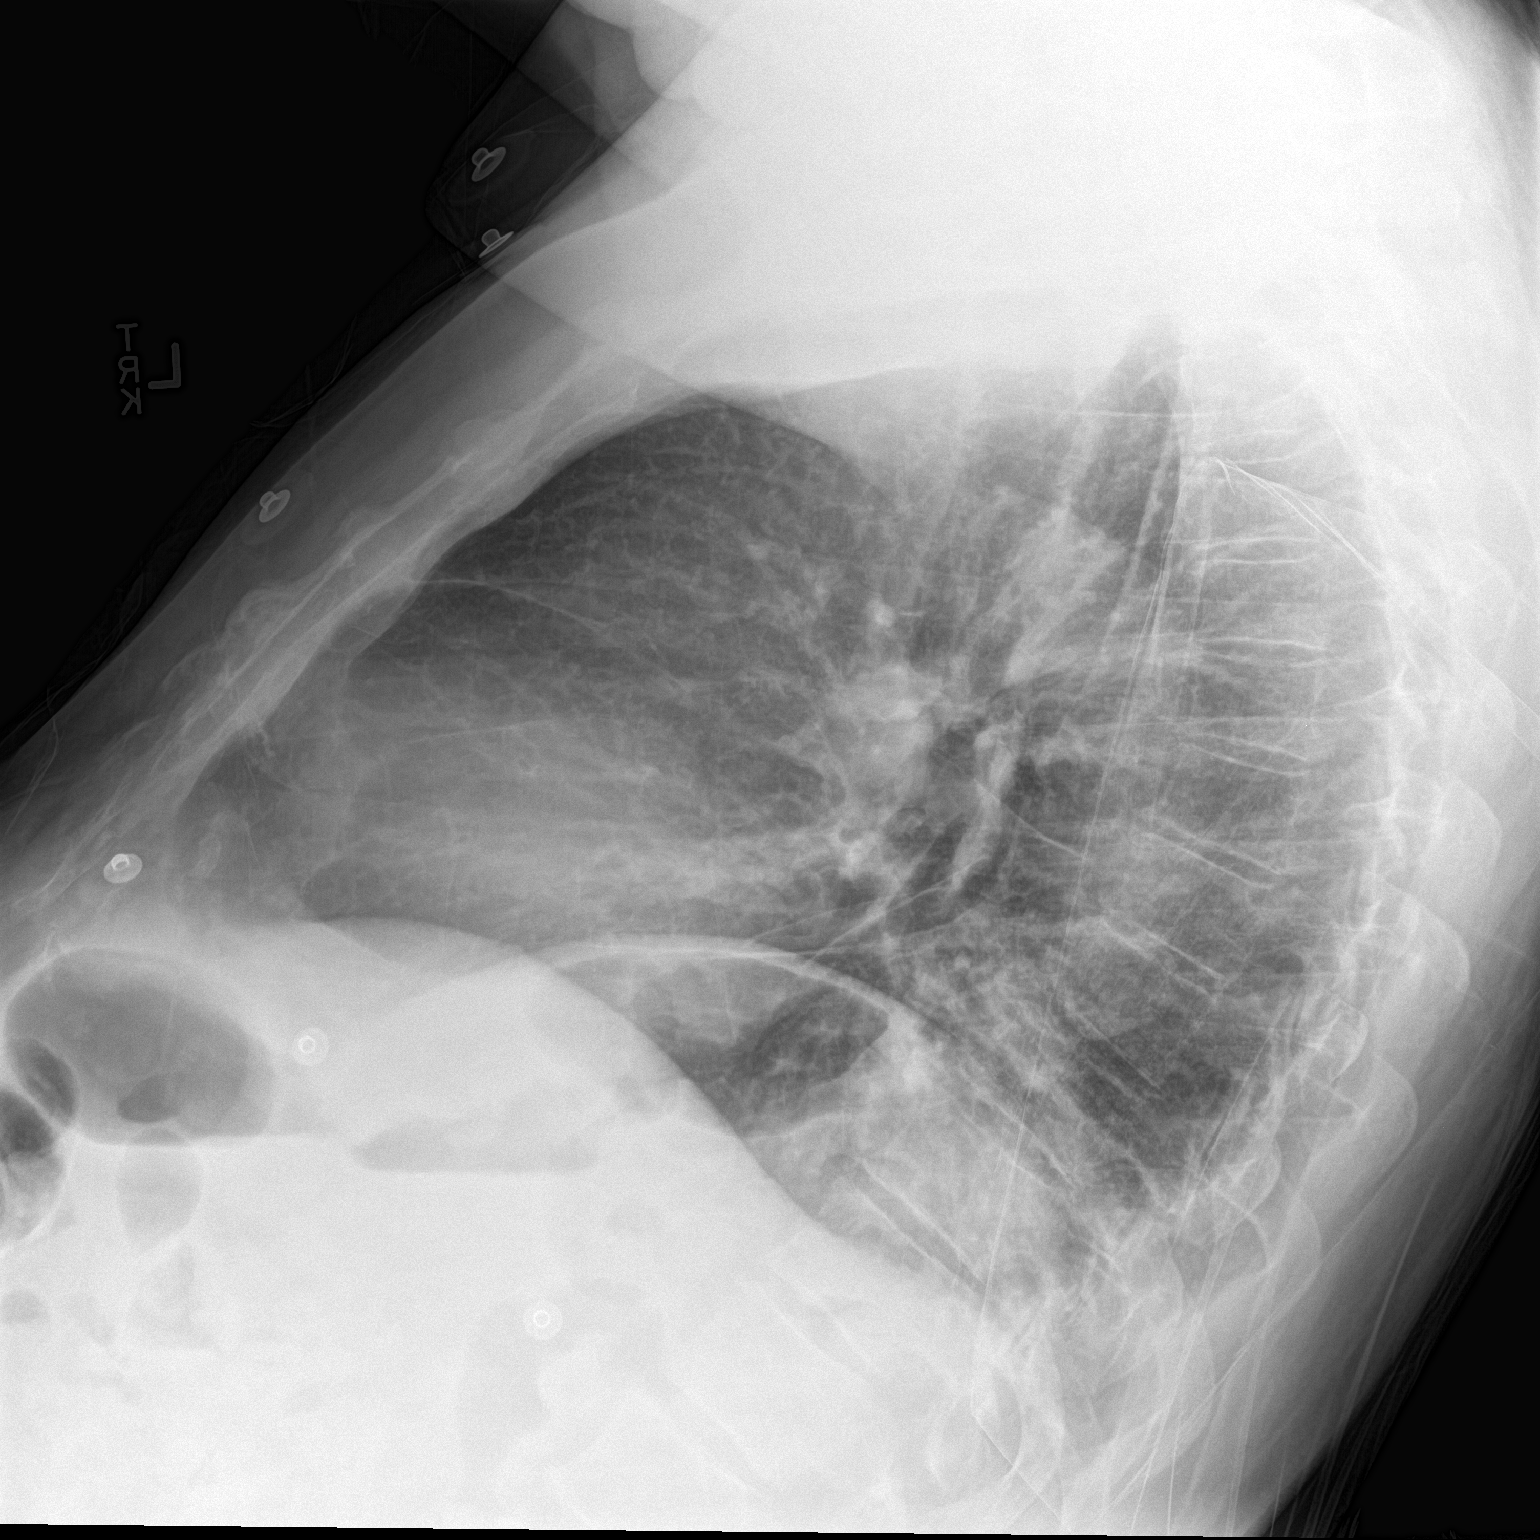

[chest ap]
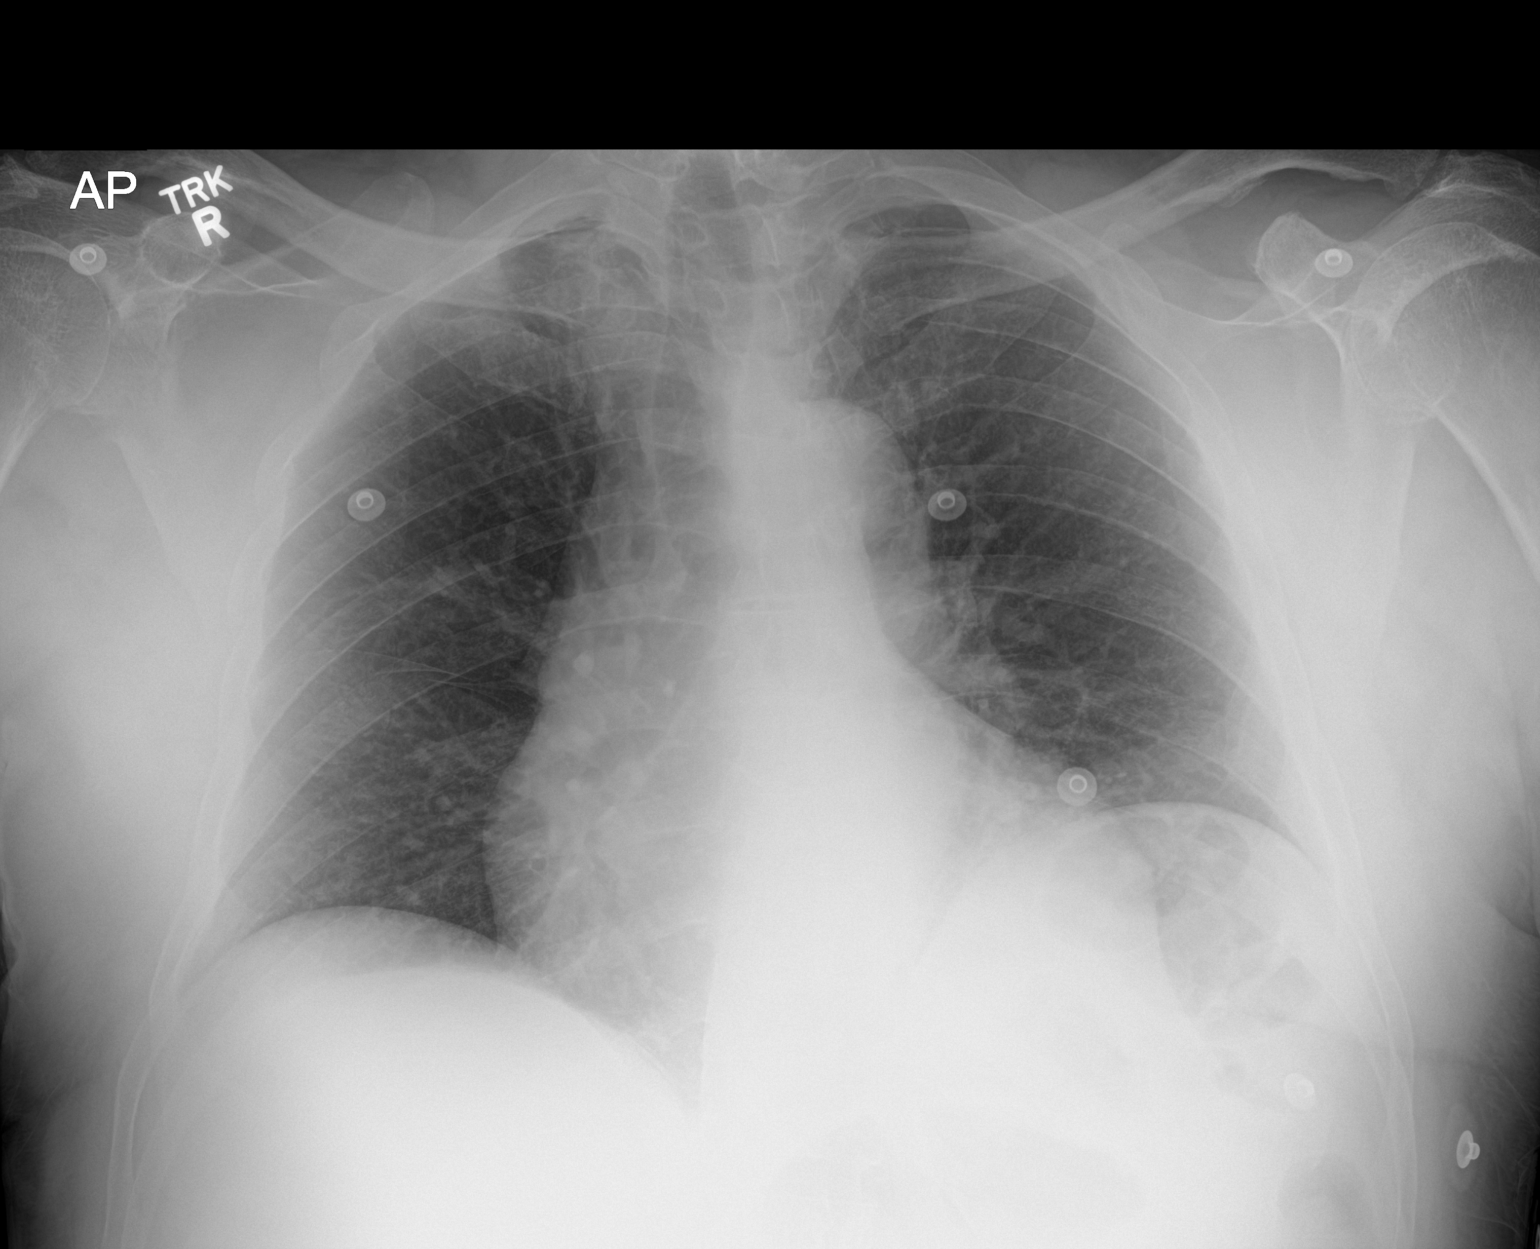

[2 of 2 positions shown; findings below may reference images not displayed]

FINDINGS: Elevated left hemidiaphragm with mild left lower lobe atelectasis,
similar to the prior study. Right lung is clear. Negative for heart
failure. Negative for pleural effusion.
IMPRESSION: Left lower lobe atelectasis.  No superimposed acute abnormality.

## 2017-11-11 ENCOUNTER — Other Ambulatory Visit: Payer: Self-pay

## 2017-11-11 DIAGNOSIS — Z01812 Encounter for preprocedural laboratory examination: Secondary | ICD-10-CM

## 2017-11-11 DIAGNOSIS — N183 Chronic kidney disease, stage 3 unspecified: Secondary | ICD-10-CM

## 2017-12-09 NOTE — Discharge Instructions (Signed)

## 2017-12-10 ENCOUNTER — Encounter (HOSPITAL_COMMUNITY)
Admission: RE | Admit: 2017-12-10 | Discharge: 2017-12-10 | Disposition: A | Discharge status: Home / Self Care | Payer: Medicare Other | Source: Ambulatory Visit | Attending: Nephrology | Admitting: Nephrology

## 2017-12-10 VITALS — BP 128/60 | HR 72 | Temp 98.5°F | Resp 18

## 2017-12-10 DIAGNOSIS — N183 Chronic kidney disease, stage 3 unspecified: Secondary | ICD-10-CM

## 2017-12-10 DIAGNOSIS — D631 Anemia in chronic kidney disease: Secondary | ICD-10-CM | POA: Diagnosis present

## 2017-12-10 DIAGNOSIS — N184 Chronic kidney disease, stage 4 (severe): Secondary | ICD-10-CM | POA: Insufficient documentation

## 2017-12-10 MED ORDER — DARBEPOETIN ALFA 60 MCG/0.3ML IJ SOSY
60.0000 ug | PREFILLED_SYRINGE | INTRAMUSCULAR | Status: DC
  Administered 2017-12-10: 60 ug via SUBCUTANEOUS

## 2017-12-10 MED ORDER — DARBEPOETIN ALFA 60 MCG/0.3ML IJ SOSY
PREFILLED_SYRINGE | INTRAMUSCULAR | Status: AC
  Filled 2017-12-10: qty 0.3

## 2017-12-11 LAB — POCT HEMOGLOBIN-HEMACUE: Hemoglobin: 9.5 g/dL — ABNORMAL LOW (ref 13.0–17.0)

## 2017-12-18 ENCOUNTER — Encounter: Payer: Self-pay | Admitting: Vascular Surgery

## 2017-12-18 ENCOUNTER — Ambulatory Visit (INDEPENDENT_AMBULATORY_CARE_PROVIDER_SITE_OTHER): Payer: Medicare Other | Admitting: Vascular Surgery

## 2017-12-18 ENCOUNTER — Ambulatory Visit (INDEPENDENT_AMBULATORY_CARE_PROVIDER_SITE_OTHER)
Admission: RE | Admit: 2017-12-18 | Discharge: 2017-12-18 | Disposition: A | Discharge status: Home / Self Care | Payer: Medicare Other | Source: Ambulatory Visit | Attending: Vascular Surgery | Admitting: Vascular Surgery

## 2017-12-18 ENCOUNTER — Other Ambulatory Visit: Payer: Self-pay

## 2017-12-18 ENCOUNTER — Ambulatory Visit (HOSPITAL_COMMUNITY)
Admission: RE | Admit: 2017-12-18 | Discharge: 2017-12-18 | Disposition: A | Discharge status: Home / Self Care | Payer: Medicare Other | Source: Ambulatory Visit | Attending: Vascular Surgery | Admitting: Vascular Surgery

## 2017-12-18 VITALS — BP 140/78 | HR 62 | Temp 97.2°F | Resp 16 | Wt 179.0 lb

## 2017-12-18 DIAGNOSIS — N183 Chronic kidney disease, stage 3 unspecified: Secondary | ICD-10-CM

## 2017-12-18 DIAGNOSIS — Z01812 Encounter for preprocedural laboratory examination: Secondary | ICD-10-CM

## 2017-12-18 DIAGNOSIS — N184 Chronic kidney disease, stage 4 (severe): Secondary | ICD-10-CM | POA: Diagnosis not present

## 2017-12-18 DIAGNOSIS — Z01818 Encounter for other preprocedural examination: Secondary | ICD-10-CM | POA: Insufficient documentation

## 2017-12-18 NOTE — Progress Notes (Signed)
Patient name: Marc Wheeler MRN: [id number] DOB: 11/28/1940 Sex: male   REASON FOR CONSULT:    To evaluate for hemodialysis access.  Patient is referred by Dr. Arrie Aranoladonato.  HPI:   Marc LieuJimmy Casciano is a pleasant 77 y.o. 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  He is not on dialysis.  He is right-handed.  He resides in a skilled nursing facility and is nonambulatory as he has bilateral below the knee amputations.  He denies any recent uremic symptoms.  Specifically, he denies nausea, vomiting, fatigue, anorexia, or palpitations.  I have reviewed the records that were sent from the referring office.  The patient does have a history of angina in the past.  He also has a history of combined systolic and diastolic congestive heart failure.  He has had a right below the knee amputation.  He has type 2 diabetes hyperlipidemia essential hypertension and multiple other medical issues.    Past Medical History:  Diagnosis Date  . Chronic kidney disease   . Depression   . Diabetes mellitus without complication (HCC)   . Diabetic retinopathy (HCC)   . Edema   . Hyperlipidemia   . Hyperlipidemia LDL goal <100 07/16/2014  . Hypertension     History reviewed. No pertinent family history.  SOCIAL HISTORY: Social History   Socioeconomic History  . Marital status: Widowed    Spouse name: Not on file  . Number of children: Not on file  . Years of education: Not on file  . Highest education level: Not on file  Social Needs  . Financial resource strain: Not on file  . Food insecurity - worry: Not on file  . Food insecurity - inability: Not on file  . Transportation needs - medical: Not on file  . Transportation needs - non-medical: Not on file  Occupational History  . Not on file  Tobacco Use  . Smoking status: Former Smoker    Packs/day: 5.00    Years: 45.00    Pack years: 225.00    Types: Cigars  . Smokeless tobacco: Never Used  Substance  and Sexual Activity  . Alcohol use: No  . Drug use: No  . Sexual activity: No  Other Topics Concern  . Not on file  Social History Narrative  . Not on file    No Known Allergies  Current Outpatient Medications  Medication Sig Dispense Refill  . acetaminophen (TYLENOL) 325 MG tablet Take 325 mg by mouth every 4 (four) hours as needed for pain.     Marland Kitchen. Alum Hydroxide-Mag Trisilicate (GAVISCON) 80-14.2 MG CHEW Chew 2 tablets by mouth every 4 (four) hours as needed (for indigestion).    Marland Kitchen. amLODipine (NORVASC) 10 MG tablet Take 10 mg by mouth daily. Reported on 12/29/2015    . aspirin 81 MG tablet Take 81 mg by mouth daily.    Marland Kitchen. atorvastatin (LIPITOR) 80 MG tablet Take 80 mg by mouth daily.    Marland Kitchen. b complex-vitamin c-folic acid (NEPHRO-VITE) 0.8 MG TABS Take 0.8 mg by mouth at bedtime.    . calcitRIOL (ROCALTROL) 0.25 MCG capsule Take 0.25 mcg by mouth 3 (three) times a week. On Monday; Wednesday; Friday    . cholecalciferol (VITAMIN D) 1000 units tablet Take 1,000 Units by mouth daily.    . clobetasol cream (TEMOVATE) 0.05 % Apply 1 application topically every 4 (four) hours as needed.    . cloNIDine (CATAPRES - DOSED IN MG/24 HR) 0.2 mg/24hr patch Place  1 patch onto the skin once a week. Wednesday    . docusate sodium (COLACE) 100 MG capsule Take 100 mg by mouth 2 (two) times daily.    . fish oil-omega-3 fatty acids 1000 MG capsule Take 1 g by mouth daily.    . furosemide (LASIX) 40 MG tablet Take 40 mg by mouth daily.    Marland Kitchen guaiFENesin (ROBITUSSIN) 100 MG/5ML SOLN Take 10 mLs by mouth every 6 (six) hours as needed for cough or to loosen phlegm.    . hydrALAZINE (APRESOLINE) 50 MG tablet Take 1 tablet (50 mg total) by mouth 4 (four) times daily.    . Insulin Glargine (TOUJEO SOLOSTAR) 300 UNIT/ML SOPN Inject 65 Units into the skin at bedtime.    . insulin lispro (HUMALOG KWIKPEN) 100 UNIT/ML KiwkPen Inject into the skin. Per sliding scale 0-149= 0 units, 150+ = 10 units subcutaneously before  meals for diabetes    . linagliptin (TRADJENTA) 5 MG TABS tablet Take 5 mg by mouth daily.    Marland Kitchen loratadine (CLARITIN) 10 MG tablet Take 10 mg by mouth daily as needed for allergies.    . metoprolol (LOPRESSOR) 100 MG tablet Take 100 mg by mouth 2 (two) times daily.    Bertram Gala Glycol-Propyl Glycol (SYSTANE) 0.4-0.3 % SOLN Apply 1 drop to eye daily as needed. Both eyes     No current facility-administered medications for this visit.     REVIEW OF SYSTEMS:  [X]  denotes positive finding, [ ]  denotes negative finding Cardiac  Comments:  Chest pain or chest pressure:    Shortness of breath upon exertion:    Short of breath when lying flat:    Irregular heart rhythm:        Vascular    Pain in calf, thigh, or hip brought on by ambulation:    Pain in feet at night that wakes you up from your sleep:     Blood clot in your veins:    Leg swelling:         Pulmonary    Oxygen at home:    Productive cough:     Wheezing:         Neurologic    Sudden weakness in arms or legs:     Sudden numbness in arms or legs:     Sudden onset of difficulty speaking or slurred speech:    Temporary loss of vision in one eye:     Problems with dizziness:         Gastrointestinal    Blood in stool:     Vomited blood:         Genitourinary    Burning when urinating:     Blood in urine:        Psychiatric    Major depression:         Hematologic    Bleeding problems:    Problems with blood clotting too easily:        Skin    Rashes or ulcers:        Constitutional    Fever or chills:     PHYSICAL EXAM:   Vitals:   12/18/17 0921  BP: 140/78  Pulse: 62  Resp: 16  Temp: (!) 97.2 F (36.2 C)  TempSrc: Oral  SpO2: 100%  Weight: 179 lb (81.2 kg)    GENERAL: The patient is a well-nourished male, in no acute distress. The vital signs are documented above. CARDIAC: There is a regular rate and rhythm.  VASCULAR: I do not detect carotid bruits. He has palpable radial pulses  bilaterally. PULMONARY: There is good air exchange bilaterally without wheezing or rales. ABDOMEN: Soft and non-tender with normal pitched bowel sounds.  MUSCULOSKELETAL: He has bilateral below the knee amputations. NEUROLOGIC: No focal weakness or paresthesias are detected. SKIN: There are no ulcers or rashes noted. PSYCHIATRIC: The patient has a normal affect.  DATA:    BILATERAL UPPER EXTREMITY VEIN MAP: I have independently interpreted his vein map that was done today.  On the right side, the forearm and upper arm cephalic vein did not appear adequate.  The basilic vein on the right is marginal in size.  On the left side the forearm and upper arm cephalic vein do not appear adequate.  The basilic vein on the left is marginal in size.  Neither basilic vein is visualized at the shoulder or upper arm and the basilic vein on the right is thickened.  UPPER EXTREMITY ARTERIAL DUPLEX: I have inability interpreted his upper extremity arterial duplex scan.  On the right side there is a triphasic radial and ulnar signal.  The brachial artery is 0.45 cm in diameter.  On the left side there is a triphasic radial and ulnar signal.  The brachial artery is 0.5 cm in diameter.  LABS: GFR on 11/07/2017 was 26.  MEDICAL ISSUES:   STAGE IV CHRONIC KIDNEY DISEASE: The patient does not appear to be a candidate for an AV fistula.  He would likely require placement of an AV graft.  I have discussed the indications for the procedure and potential complications with him however currently he would not like to schedule surgery.  He would like to discuss this more with his family.  Given that he has stage IV chronic kidney disease with a GFR of 26 I think it would be reasonable to place a graft once he is closer to needing dialysis.  If he elects to proceed however certainly we be ready to schedule surgery.  I would probably place a graft in his left arm given that he is right-handed.  Waverly [facility] [phone]

## 2017-12-18 NOTE — H&P (View-Only) (Signed)
Patient name: Marc Wheeler MRN: [id number] DOB: 11/28/1940 Sex: male   REASON FOR CONSULT:    To evaluate for hemodialysis access.  Patient is referred by Dr. Arrie Aranoladonato.  HPI:   Marc LieuJimmy Casciano is a pleasant 77 y.o. 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  He is not on dialysis.  He is right-handed.  He resides in a skilled nursing facility and is nonambulatory as he has bilateral below the knee amputations.  He denies any recent uremic symptoms.  Specifically, he denies nausea, vomiting, fatigue, anorexia, or palpitations.  I have reviewed the records that were sent from the referring office.  The patient does have a history of angina in the past.  He also has a history of combined systolic and diastolic congestive heart failure.  He has had a right below the knee amputation.  He has type 2 diabetes hyperlipidemia essential hypertension and multiple other medical issues.    Past Medical History:  Diagnosis Date  . Chronic kidney disease   . Depression   . Diabetes mellitus without complication (HCC)   . Diabetic retinopathy (HCC)   . Edema   . Hyperlipidemia   . Hyperlipidemia LDL goal <100 07/16/2014  . Hypertension     History reviewed. No pertinent family history.  SOCIAL HISTORY: Social History   Socioeconomic History  . Marital status: Widowed    Spouse name: Not on file  . Number of children: Not on file  . Years of education: Not on file  . Highest education level: Not on file  Social Needs  . Financial resource strain: Not on file  . Food insecurity - worry: Not on file  . Food insecurity - inability: Not on file  . Transportation needs - medical: Not on file  . Transportation needs - non-medical: Not on file  Occupational History  . Not on file  Tobacco Use  . Smoking status: Former Smoker    Packs/day: 5.00    Years: 45.00    Pack years: 225.00    Types: Cigars  . Smokeless tobacco: Never Used  Substance  and Sexual Activity  . Alcohol use: No  . Drug use: No  . Sexual activity: No  Other Topics Concern  . Not on file  Social History Narrative  . Not on file    No Known Allergies  Current Outpatient Medications  Medication Sig Dispense Refill  . acetaminophen (TYLENOL) 325 MG tablet Take 325 mg by mouth every 4 (four) hours as needed for pain.     Marland Kitchen. Alum Hydroxide-Mag Trisilicate (GAVISCON) 80-14.2 MG CHEW Chew 2 tablets by mouth every 4 (four) hours as needed (for indigestion).    Marland Kitchen. amLODipine (NORVASC) 10 MG tablet Take 10 mg by mouth daily. Reported on 12/29/2015    . aspirin 81 MG tablet Take 81 mg by mouth daily.    Marland Kitchen. atorvastatin (LIPITOR) 80 MG tablet Take 80 mg by mouth daily.    Marland Kitchen. b complex-vitamin c-folic acid (NEPHRO-VITE) 0.8 MG TABS Take 0.8 mg by mouth at bedtime.    . calcitRIOL (ROCALTROL) 0.25 MCG capsule Take 0.25 mcg by mouth 3 (three) times a week. On Monday; Wednesday; Friday    . cholecalciferol (VITAMIN D) 1000 units tablet Take 1,000 Units by mouth daily.    . clobetasol cream (TEMOVATE) 0.05 % Apply 1 application topically every 4 (four) hours as needed.    . cloNIDine (CATAPRES - DOSED IN MG/24 HR) 0.2 mg/24hr patch Place  1 patch onto the skin once a week. Wednesday    . docusate sodium (COLACE) 100 MG capsule Take 100 mg by mouth 2 (two) times daily.    . fish oil-omega-3 fatty acids 1000 MG capsule Take 1 g by mouth daily.    . furosemide (LASIX) 40 MG tablet Take 40 mg by mouth daily.    Marland Kitchen guaiFENesin (ROBITUSSIN) 100 MG/5ML SOLN Take 10 mLs by mouth every 6 (six) hours as needed for cough or to loosen phlegm.    . hydrALAZINE (APRESOLINE) 50 MG tablet Take 1 tablet (50 mg total) by mouth 4 (four) times daily.    . Insulin Glargine (TOUJEO SOLOSTAR) 300 UNIT/ML SOPN Inject 65 Units into the skin at bedtime.    . insulin lispro (HUMALOG KWIKPEN) 100 UNIT/ML KiwkPen Inject into the skin. Per sliding scale 0-149= 0 units, 150+ = 10 units subcutaneously before  meals for diabetes    . linagliptin (TRADJENTA) 5 MG TABS tablet Take 5 mg by mouth daily.    Marland Kitchen loratadine (CLARITIN) 10 MG tablet Take 10 mg by mouth daily as needed for allergies.    . metoprolol (LOPRESSOR) 100 MG tablet Take 100 mg by mouth 2 (two) times daily.    Bertram Gala Glycol-Propyl Glycol (SYSTANE) 0.4-0.3 % SOLN Apply 1 drop to eye daily as needed. Both eyes     No current facility-administered medications for this visit.     REVIEW OF SYSTEMS:  [X]  denotes positive finding, [ ]  denotes negative finding Cardiac  Comments:  Chest pain or chest pressure:    Shortness of breath upon exertion:    Short of breath when lying flat:    Irregular heart rhythm:        Vascular    Pain in calf, thigh, or hip brought on by ambulation:    Pain in feet at night that wakes you up from your sleep:     Blood clot in your veins:    Leg swelling:         Pulmonary    Oxygen at home:    Productive cough:     Wheezing:         Neurologic    Sudden weakness in arms or legs:     Sudden numbness in arms or legs:     Sudden onset of difficulty speaking or slurred speech:    Temporary loss of vision in one eye:     Problems with dizziness:         Gastrointestinal    Blood in stool:     Vomited blood:         Genitourinary    Burning when urinating:     Blood in urine:        Psychiatric    Major depression:         Hematologic    Bleeding problems:    Problems with blood clotting too easily:        Skin    Rashes or ulcers:        Constitutional    Fever or chills:     PHYSICAL EXAM:   Vitals:   12/18/17 0921  BP: 140/78  Pulse: 62  Resp: 16  Temp: (!) 97.2 F (36.2 C)  TempSrc: Oral  SpO2: 100%  Weight: 179 lb (81.2 kg)    GENERAL: The patient is a well-nourished male, in no acute distress. The vital signs are documented above. CARDIAC: There is a regular rate and rhythm.  VASCULAR: I do not detect carotid bruits. He has palpable radial pulses  bilaterally. PULMONARY: There is good air exchange bilaterally without wheezing or rales. ABDOMEN: Soft and non-tender with normal pitched bowel sounds.  MUSCULOSKELETAL: He has bilateral below the knee amputations. NEUROLOGIC: No focal weakness or paresthesias are detected. SKIN: There are no ulcers or rashes noted. PSYCHIATRIC: The patient has a normal affect.  DATA:    BILATERAL UPPER EXTREMITY VEIN MAP: I have independently interpreted his vein map that was done today.  On the right side, the forearm and upper arm cephalic vein did not appear adequate.  The basilic vein on the right is marginal in size.  On the left side the forearm and upper arm cephalic vein do not appear adequate.  The basilic vein on the left is marginal in size.  Neither basilic vein is visualized at the shoulder or upper arm and the basilic vein on the right is thickened.  UPPER EXTREMITY ARTERIAL DUPLEX: I have inability interpreted his upper extremity arterial duplex scan.  On the right side there is a triphasic radial and ulnar signal.  The brachial artery is 0.45 cm in diameter.  On the left side there is a triphasic radial and ulnar signal.  The brachial artery is 0.5 cm in diameter.  LABS: GFR on 11/07/2017 was 26.  MEDICAL ISSUES:   STAGE IV CHRONIC KIDNEY DISEASE: The patient does not appear to be a candidate for an AV fistula.  He would likely require placement of an AV graft.  I have discussed the indications for the procedure and potential complications with him however currently he would not like to schedule surgery.  He would like to discuss this more with his family.  Given that he has stage IV chronic kidney disease with a GFR of 26 I think it would be reasonable to place a graft once he is closer to needing dialysis.  If he elects to proceed however certainly we be ready to schedule surgery.  I would probably place a graft in his left arm given that he is right-handed.  Waverly Ferrari [facility] [phone]

## 2017-12-23 ENCOUNTER — Encounter: Payer: Self-pay | Admitting: *Deleted

## 2017-12-23 ENCOUNTER — Other Ambulatory Visit: Payer: Self-pay | Admitting: *Deleted

## 2017-12-30 NOTE — Pre-Procedure Instructions (Signed)
Marc Wheeler  12/30/2017     No Pharmacies Listed   Your procedure is scheduled on March 11  [facility] at 0700 A.M.  Call this number if you have problems the morning of surgery:  [phone]   Remember:  Do not eat food or drink liquids after midnight.  Continue all medications as directed by your physician except follow these medication instructions before surgery below    Take these medicines the morning of surgery with A SIP OF WATER  acetaminophen (TYLENOL)  amLODipine (NORVASC) Eye drops if needed loratadine (CLARITIN) metoprolol (LOPRESSOR) ]  7 days prior to surgery STOP taking any Aspirin(unless otherwise instructed by your surgeon), Aleve, Naproxen, Ibuprofen, Motrin, Advil, Goody's, BC's, all herbal medications, fish oil, and all vitamins   WHAT DO I DO ABOUT MY DIABETES MEDICATION?   Marland Kitchen. Do not take oral diabetes medicines (pills) the morning of surgery. linagliptin (TRADJENTA)  . THE NIGHT BEFORE SURGERY, take ____30_______ units of __Insulin Glargine (TOUJEO SOLOSTAR_________insulin.       . THE MORNING OF SURGERY, take _____________ units of __________insulin.  . The day of surgery, do not take other diabetes injectables, including Byetta (exenatide), Bydureon (exenatide ER), Victoza (liraglutide), or Trulicity (dulaglutide).  . If your CBG is greater than 220 mg/dL, you may take  of your sliding scale (correction) dose of insulin. insulin lispro (HUMALOG KWIKPEN) 1/2 dose per sliding scale    How to Manage Your Diabetes Before and After Surgery  Why is it important to control my blood sugar before and after surgery? . Improving blood sugar levels before and after surgery helps healing and can limit problems. . A way of improving blood sugar control is eating a healthy diet by: o  Eating less sugar and carbohydrates o  Increasing activity/exercise o  Talking with your doctor about reaching your blood sugar  goals . High blood sugars (greater than 180 mg/dL) can raise your risk of infections and slow your recovery, so you will need to focus on controlling your diabetes during the weeks before surgery. . Make sure that the doctor who takes care of your diabetes knows about your planned surgery including the date and location.  How do I manage my blood sugar before surgery? . Check your blood sugar at least 4 times a day, starting 2 days before surgery, to make sure that the level is not too high or low. o Check your blood sugar the morning of your surgery when you wake up and every 2 hours until you get to the Short Stay unit. . If your blood sugar is less than 70 mg/dL, you will need to treat for low blood sugar: o Do not take insulin. o Treat a low blood sugar (less than 70 mg/dL) with  cup of clear juice (cranberry or apple), 4 glucose tablets, OR glucose gel. o Recheck blood sugar in 15 minutes after treatment (to make sure it is greater than 70 mg/dL). If your blood sugar is not greater than 70 mg/dL on recheck, call [phone] for further instructions. . Report your blood sugar to the short stay nurse when you get to Short Stay.  . If you are admitted to the hospital after surgery: o Your blood sugar will be checked by the staff and you will probably be given insulin after surgery (instead of oral diabetes medicines) to make sure you have good blood sugar levels. o The goal for blood sugar control after surgery is  80-180 mg/dL.       Do not wear jewelry, make-up or nail polish.  Do not wear lotions, powders, or perfumes, or deodorant.  Do not shave 48 hours prior to surgery.  Men may shave face and neck.  Do not bring valuables to the hospital.  [facility] is not responsible for any belongings or valuables.  Contacts, dentures or bridgework may not be worn into surgery.  Leave your suitcase in the car.  After surgery it may be brought to your room.  For patients admitted to the  hospital, discharge time will be determined by your treatment team.  Patients discharged the day of surgery will not be allowed to drive home.    Special instructions:  PLEASE PROVIDE THE LAST DOSE OF ALL MEDICATIONS THAT ARE GIVEN TO PATIENT.  ALSO PLEASE INCLUDE TIME FOR MEDICATIONS THAT WERE GIVEN THE MORNING OF SURGERY  Please read over the following fact sheets that you were given.

## 2017-12-31 ENCOUNTER — Other Ambulatory Visit: Payer: Self-pay

## 2017-12-31 ENCOUNTER — Telehealth: Payer: Self-pay | Admitting: Vascular Surgery

## 2017-12-31 ENCOUNTER — Ambulatory Visit (HOSPITAL_COMMUNITY): Payer: Medicare Other | Admitting: Anesthesiology

## 2017-12-31 ENCOUNTER — Ambulatory Visit (HOSPITAL_COMMUNITY)
Admission: RE | Admit: 2017-12-31 | Discharge: 2017-12-31 | Disposition: A | Discharge status: Skilled Nursing Facility | Payer: Medicare Other | Source: Ambulatory Visit | Attending: Vascular Surgery | Admitting: Vascular Surgery

## 2017-12-31 ENCOUNTER — Encounter (HOSPITAL_COMMUNITY)
Admission: RE | Disposition: A | Discharge status: Skilled Nursing Facility | Payer: Self-pay | Source: Ambulatory Visit | Attending: Vascular Surgery

## 2017-12-31 ENCOUNTER — Encounter (HOSPITAL_COMMUNITY): Payer: Self-pay | Admitting: *Deleted

## 2017-12-31 DIAGNOSIS — Z87891 Personal history of nicotine dependence: Secondary | ICD-10-CM | POA: Diagnosis not present

## 2017-12-31 DIAGNOSIS — E785 Hyperlipidemia, unspecified: Secondary | ICD-10-CM | POA: Diagnosis not present

## 2017-12-31 DIAGNOSIS — F329 Major depressive disorder, single episode, unspecified: Secondary | ICD-10-CM | POA: Insufficient documentation

## 2017-12-31 DIAGNOSIS — E11319 Type 2 diabetes mellitus with unspecified diabetic retinopathy without macular edema: Secondary | ICD-10-CM | POA: Insufficient documentation

## 2017-12-31 DIAGNOSIS — N184 Chronic kidney disease, stage 4 (severe): Secondary | ICD-10-CM | POA: Insufficient documentation

## 2017-12-31 DIAGNOSIS — I130 Hypertensive heart and chronic kidney disease with heart failure and stage 1 through stage 4 chronic kidney disease, or unspecified chronic kidney disease: Secondary | ICD-10-CM | POA: Insufficient documentation

## 2017-12-31 DIAGNOSIS — E1151 Type 2 diabetes mellitus with diabetic peripheral angiopathy without gangrene: Secondary | ICD-10-CM | POA: Diagnosis not present

## 2017-12-31 DIAGNOSIS — Z7982 Long term (current) use of aspirin: Secondary | ICD-10-CM | POA: Insufficient documentation

## 2017-12-31 DIAGNOSIS — M1990 Unspecified osteoarthritis, unspecified site: Secondary | ICD-10-CM | POA: Diagnosis not present

## 2017-12-31 DIAGNOSIS — Z6841 Body Mass Index (BMI) 40.0 and over, adult: Secondary | ICD-10-CM | POA: Insufficient documentation

## 2017-12-31 DIAGNOSIS — E1122 Type 2 diabetes mellitus with diabetic chronic kidney disease: Secondary | ICD-10-CM | POA: Diagnosis present

## 2017-12-31 DIAGNOSIS — Z89512 Acquired absence of left leg below knee: Secondary | ICD-10-CM | POA: Insufficient documentation

## 2017-12-31 DIAGNOSIS — Z794 Long term (current) use of insulin: Secondary | ICD-10-CM | POA: Insufficient documentation

## 2017-12-31 DIAGNOSIS — Z89511 Acquired absence of right leg below knee: Secondary | ICD-10-CM | POA: Diagnosis not present

## 2017-12-31 DIAGNOSIS — I5042 Chronic combined systolic (congestive) and diastolic (congestive) heart failure: Secondary | ICD-10-CM | POA: Diagnosis not present

## 2017-12-31 DIAGNOSIS — D631 Anemia in chronic kidney disease: Secondary | ICD-10-CM | POA: Insufficient documentation

## 2017-12-31 HISTORY — DX: Hyperlipidemia, unspecified: E78.5

## 2017-12-31 HISTORY — DX: Anemia, unspecified: D64.9

## 2017-12-31 HISTORY — PX: AV FISTULA PLACEMENT: SHX1204

## 2017-12-31 HISTORY — DX: Vascular headache, not elsewhere classified: G44.1

## 2017-12-31 HISTORY — DX: Angina pectoris, unspecified: I20.9

## 2017-12-31 HISTORY — DX: Muscle weakness (generalized): M62.81

## 2017-12-31 HISTORY — DX: Atherosclerotic heart disease of native coronary artery without angina pectoris: I25.10

## 2017-12-31 HISTORY — DX: Polyneuropathy, unspecified: G62.9

## 2017-12-31 HISTORY — DX: Heart failure, unspecified: I50.9

## 2017-12-31 HISTORY — DX: Secondary hyperaldosteronism: E26.1

## 2017-12-31 LAB — POCT I-STAT 4, (NA,K, GLUC, HGB,HCT)
GLUCOSE: 87 mg/dL (ref 65–99)
HEMATOCRIT: 32 % — AB (ref 39.0–52.0)
Hemoglobin: 10.9 g/dL — ABNORMAL LOW (ref 13.0–17.0)
POTASSIUM: 5.1 mmol/L (ref 3.5–5.1)
Sodium: 145 mmol/L (ref 135–145)

## 2017-12-31 LAB — GLUCOSE, CAPILLARY
Glucose-Capillary: 88 mg/dL (ref 65–99)
Glucose-Capillary: 80 mg/dL (ref 65–99)

## 2017-12-31 SURGERY — INSERTION OF ARTERIOVENOUS (AV) GORE-TEX GRAFT ARM
Anesthesia: General | Site: Arm Lower | Laterality: Left

## 2017-12-31 MED ORDER — PHENYLEPHRINE HCL 10 MG/ML IJ SOLN
INTRAMUSCULAR | Status: DC | PRN
  Administered 2017-12-31: 50 ug via INTRAVENOUS

## 2017-12-31 MED ORDER — HEPARIN SODIUM (PORCINE) 1000 UNIT/ML IJ SOLN
INTRAMUSCULAR | Status: DC | PRN
  Administered 2017-12-31: 7000 [IU] via INTRAVENOUS

## 2017-12-31 MED ORDER — FENTANYL CITRATE (PF) 250 MCG/5ML IJ SOLN
INTRAMUSCULAR | Status: DC | PRN
  Administered 2017-12-31 (×2): 25 ug via INTRAVENOUS

## 2017-12-31 MED ORDER — FENTANYL CITRATE (PF) 250 MCG/5ML IJ SOLN
INTRAMUSCULAR | Status: AC
  Filled 2017-12-31: qty 5

## 2017-12-31 MED ORDER — SODIUM CHLORIDE 0.9 % IV SOLN
INTRAVENOUS | Status: DC | PRN
  Administered 2017-12-31: 10:00:00 via INTRAVENOUS

## 2017-12-31 MED ORDER — LIDOCAINE-EPINEPHRINE (PF) 1 %-1:200000 IJ SOLN
INTRAMUSCULAR | Status: DC | PRN
  Administered 2017-12-31: 28 mL

## 2017-12-31 MED ORDER — PROPOFOL 10 MG/ML IV BOLUS
INTRAVENOUS | Status: DC | PRN
  Administered 2017-12-31: 120 mg via INTRAVENOUS

## 2017-12-31 MED ORDER — CEFAZOLIN SODIUM-DEXTROSE 2-4 GM/100ML-% IV SOLN
2.0000 g | INTRAVENOUS | Status: AC
  Administered 2017-12-31: 2 g via INTRAVENOUS
  Filled 2017-12-31: qty 100

## 2017-12-31 MED ORDER — CEFAZOLIN SODIUM-DEXTROSE 2-4 GM/100ML-% IV SOLN
INTRAVENOUS | Status: AC
  Filled 2017-12-31: qty 100

## 2017-12-31 MED ORDER — OXYCODONE HCL 5 MG PO TABS: 5.0000 mg | ORAL_TABLET | Freq: Once | ORAL | Status: DC | PRN

## 2017-12-31 MED ORDER — SODIUM CHLORIDE 0.9 % IV SOLN
INTRAVENOUS | Status: DC | PRN
  Administered 2017-12-31: 10:00:00

## 2017-12-31 MED ORDER — 0.9 % SODIUM CHLORIDE (POUR BTL) OPTIME
TOPICAL | Status: DC | PRN
  Administered 2017-12-31: 1000 mL

## 2017-12-31 MED ORDER — METOPROLOL TARTRATE 100 MG PO TABS
100.0000 mg | ORAL_TABLET | ORAL | Status: AC
  Administered 2017-12-31: 100 mg via ORAL
  Filled 2017-12-31: qty 1

## 2017-12-31 MED ORDER — LIDOCAINE HCL (CARDIAC) 20 MG/ML IV SOLN
INTRAVENOUS | Status: DC | PRN
  Administered 2017-12-31: 60 mg via INTRATRACHEAL

## 2017-12-31 MED ORDER — SODIUM CHLORIDE 0.9 % IV SOLN
INTRAVENOUS | Status: DC
  Administered 2017-12-31: 09:00:00 via INTRAVENOUS

## 2017-12-31 MED ORDER — OXYCODONE HCL 5 MG/5ML PO SOLN: 5.0000 mg | Freq: Once | ORAL | Status: DC | PRN

## 2017-12-31 MED ORDER — PROPOFOL 10 MG/ML IV BOLUS
INTRAVENOUS | Status: AC
  Filled 2017-12-31: qty 40

## 2017-12-31 MED ORDER — ONDANSETRON HCL 4 MG/2ML IJ SOLN: 4.0000 mg | Freq: Once | INTRAMUSCULAR | Status: DC | PRN

## 2017-12-31 MED ORDER — PROTAMINE SULFATE 10 MG/ML IV SOLN
INTRAVENOUS | Status: DC | PRN
  Administered 2017-12-31: 40 mg via INTRAVENOUS

## 2017-12-31 MED ORDER — EPHEDRINE SULFATE 50 MG/ML IJ SOLN
INTRAMUSCULAR | Status: DC | PRN
  Administered 2017-12-31: 5 mg via INTRAVENOUS

## 2017-12-31 MED ORDER — CHLORHEXIDINE GLUCONATE 4 % EX LIQD: 60.0000 mL | Freq: Once | CUTANEOUS | Status: DC

## 2017-12-31 MED ORDER — FENTANYL CITRATE (PF) 100 MCG/2ML IJ SOLN: 25.0000 ug | INTRAMUSCULAR | Status: DC | PRN

## 2017-12-31 MED ORDER — ONDANSETRON HCL 4 MG/2ML IJ SOLN
INTRAMUSCULAR | Status: DC | PRN
  Administered 2017-12-31: 4 mg via INTRAVENOUS

## 2017-12-31 MED ORDER — OXYCODONE-ACETAMINOPHEN 5-325 MG PO TABS: 1.0000 | ORAL_TABLET | Freq: Four times a day (QID) | ORAL | 0 refills | Status: DC | PRN

## 2017-12-31 MED ORDER — PHENYLEPHRINE HCL 10 MG/ML IJ SOLN
INTRAVENOUS | Status: DC | PRN
  Administered 2017-12-31: 20 ug/min via INTRAVENOUS

## 2017-12-31 SURGICAL SUPPLY — 42 items
ARMBAND PINK RESTRICT EXTREMIT (MISCELLANEOUS) ×6 IMPLANT
CANISTER SUCT 3000ML PPV (MISCELLANEOUS) ×3 IMPLANT
CANNULA VESSEL 3MM 2 BLNT TIP (CANNULA) ×3 IMPLANT
CLIP TI WIDE RED SMALL 6 (CLIP) ×3 IMPLANT
CLIP VESOCCLUDE MED 6/CT (CLIP) ×3 IMPLANT
CLIP VESOCCLUDE SM WIDE 6/CT (CLIP) ×3 IMPLANT
DECANTER SPIKE VIAL GLASS SM (MISCELLANEOUS) ×6 IMPLANT
DERMABOND ADHESIVE PROPEN (GAUZE/BANDAGES/DRESSINGS) ×2
DERMABOND ADVANCED (GAUZE/BANDAGES/DRESSINGS) ×2
DERMABOND ADVANCED .7 DNX12 (GAUZE/BANDAGES/DRESSINGS) ×1 IMPLANT
DERMABOND ADVANCED .7 DNX6 (GAUZE/BANDAGES/DRESSINGS) ×1 IMPLANT
ELECT REM PT RETURN 9FT ADLT (ELECTROSURGICAL) ×3
ELECTRODE REM PT RTRN 9FT ADLT (ELECTROSURGICAL) ×1 IMPLANT
GLOVE BIO SURGEON STRL SZ 6.5 (GLOVE) ×2 IMPLANT
GLOVE BIO SURGEON STRL SZ7 (GLOVE) ×3 IMPLANT
GLOVE BIO SURGEON STRL SZ7.5 (GLOVE) ×3 IMPLANT
GLOVE BIO SURGEONS STRL SZ 6.5 (GLOVE) ×1
GLOVE BIOGEL PI IND STRL 6.5 (GLOVE) ×1 IMPLANT
GLOVE BIOGEL PI IND STRL 7.0 (GLOVE) ×1 IMPLANT
GLOVE BIOGEL PI IND STRL 7.5 (GLOVE) ×1 IMPLANT
GLOVE BIOGEL PI IND STRL 8 (GLOVE) ×1 IMPLANT
GLOVE BIOGEL PI INDICATOR 6.5 (GLOVE) ×2
GLOVE BIOGEL PI INDICATOR 7.0 (GLOVE) ×2
GLOVE BIOGEL PI INDICATOR 7.5 (GLOVE) ×2
GLOVE BIOGEL PI INDICATOR 8 (GLOVE) ×2
GOWN STRL REUS W/ TWL LRG LVL3 (GOWN DISPOSABLE) ×3 IMPLANT
GOWN STRL REUS W/TWL LRG LVL3 (GOWN DISPOSABLE) ×6
GRAFT GORETEX STRT 4-7X45 (Vascular Products) ×3 IMPLANT
KIT BASIN OR (CUSTOM PROCEDURE TRAY) ×3 IMPLANT
KIT ROOM TURNOVER OR (KITS) ×3 IMPLANT
NS IRRIG 1000ML POUR BTL (IV SOLUTION) ×3 IMPLANT
PACK CV ACCESS (CUSTOM PROCEDURE TRAY) ×3 IMPLANT
PAD ARMBOARD 7.5X6 YLW CONV (MISCELLANEOUS) ×6 IMPLANT
SPONGE SURGIFOAM ABS GEL 100 (HEMOSTASIS) IMPLANT
SUT PROLENE 6 0 BV (SUTURE) ×9 IMPLANT
SUT VIC AB 3-0 SH 27 (SUTURE) ×4
SUT VIC AB 3-0 SH 27X BRD (SUTURE) ×2 IMPLANT
SUT VICRYL 4-0 PS2 18IN ABS (SUTURE) ×6 IMPLANT
SYR TOOMEY 50ML (SYRINGE) IMPLANT
TOWEL GREEN STERILE (TOWEL DISPOSABLE) ×3 IMPLANT
UNDERPAD 30X30 (UNDERPADS AND DIAPERS) ×3 IMPLANT
WATER STERILE IRR 1000ML POUR (IV SOLUTION) ×3 IMPLANT

## 2017-12-31 NOTE — Op Note (Signed)
    NAME: Marc Wheeler   MRN: [id number] DOB: 08/08/1941    DATE OF OPERATION: 12/31/2017  PREOP DIAGNOSIS:    Stage IV chronic kidney disease  POSTOP DIAGNOSIS:    Same  PROCEDURE:    Left forearm AV graft  SURGEON: Di Kindlehristopher S. Edilia Boickson, MD, FACS  ASSIST: Lianne CureMaureen Collins PA  ANESTHESIA: General  EBL: Minimal  INDICATIONS:    Marc Wheeler is a 77 y.o. male who is not yet on dialysis.  He presents for new hemodialysis access.  FINDINGS:   4.5 mm brachial vein.  Palpable radial pulse at the completion of the procedure   TECHNIQUE:   The patient was taken to the operating room and received a general anesthetic.  The left arm was prepped and draped in usual sterile fashion.  The patient did not have an adequate vein for a fistula.  A longitudinal incision was made just above the antecubital level.  Here the brachial artery and brachial vein were dissected free and controlled with Vesseloops.  Using 1 distal counterincision, a 4-7 mm PTFE graft was tunneled in a loop fashion in the forearm with the arterial aspect of the graft along the lateral aspect of the forearm.  The patient was heparinized.  The brachial artery was clamped proximally and distally and a longitudinal arteriotomy was made.  A segment of the 4 mm end of the graft was excised, the graft slightly spatulated, and sewn end to side to the brachial artery using continuous 6-0 Prolene suture.  The graft was then pulled the appropriate length for anastomosis to the brachial vein.  The vein was ligated distally and spatulated proximally.  The graft was cut the appropriate length spatulated and sewn end to end to the vein using continuous 6-0 Prolene suture.  At the completion was an excellent thrill in the graft.  There was a palpable radial pulse.  The heparin was partially reversed with protamine.  Hemostasis was obtained in the wounds.  The wounds were each closed with a deep layer of 3-0 Vicryl and the skin closed  with 4-0 Vicryl.  Dermabond was applied.  The patient tolerated the procedure well was transferred to the recovery room in stable condition.  All needle and sponge counts were correct.  Waverly Ferrarihristopher Macgregor Aeschliman, MD, FACS [facility]  DATE OF DICTATION:   12/31/2017

## 2017-12-31 NOTE — Transfer of Care (Signed)
Immediate Anesthesia Transfer of Care Note  Patient: Marc Wheeler  Procedure(s) Performed: INSERTION OF ARTERIOVENOUS (AV) GORE-TEX GRAFT ARM (Left Arm Lower)  Patient Location: PACU  Anesthesia Type:General  Level of Consciousness: awake, alert , oriented and patient cooperative  Airway & Oxygen Therapy: Patient Spontanous Breathing and Patient connected to face mask oxygen  Post-op Assessment: Report given to RN and Post -op Vital signs reviewed and stable  Post vital signs: Reviewed and stable  Last Vitals:  Vitals:   12/31/17 0743 12/31/17 0837  BP: (!) 163/77 (!) 157/73  Pulse: 66 66  Resp: 18   Temp: 36.9 C   SpO2: 100%     Last Pain:  Vitals:   12/31/17 0743  TempSrc: Oral      Patients Stated Pain Goal: 3 (12/31/17 0758)  Complications: No apparent anesthesia complications

## 2017-12-31 NOTE — Telephone Encounter (Signed)
Unable to leave pt vm re 01/29/18 appt, mailed letter.

## 2017-12-31 NOTE — Anesthesia Procedure Notes (Signed)
Procedure Name: LMA Insertion Date/Time: 12/31/2017 9:46 AM Performed by: Wilder GladeWinn, Sueann Brownley G, CRNA Pre-anesthesia Checklist: Patient identified, Emergency Drugs available, Suction available, Patient being monitored and Timeout performed Patient Re-evaluated:Patient Re-evaluated prior to induction Oxygen Delivery Method: Circle system utilized Preoxygenation: Pre-oxygenation with 100% oxygen Induction Type: IV induction LMA: LMA with gastric port inserted LMA Size: 4.0 Number of attempts: 1 Placement Confirmation: positive ETCO2 and breath sounds checked- equal and bilateral ETT to lip (cm): yes. Tube secured with: Tape Dental Injury: Teeth and Oropharynx as per pre-operative assessment

## 2017-12-31 NOTE — Anesthesia Preprocedure Evaluation (Addendum)
Anesthesia Evaluation  Patient identified by MRN, date of birth, ID band Patient awake    Reviewed: Allergy & Precautions, NPO status , Patient's Chart, lab work & pertinent test results, reviewed documented beta blocker date and time   Airway Mallampati: II  TM Distance: >3 FB Neck ROM: Full    Dental  (+) Edentulous Upper, Edentulous Lower   Pulmonary former smoker,    Pulmonary exam normal breath sounds clear to auscultation (-) decreased breath sounds      Cardiovascular hypertension, Pt. on medications and Pt. on home beta blockers + CAD and + Peripheral Vascular Disease  (-) Past MI Normal cardiovascular exam Rhythm:Regular Rate:Normal  1st deg AVB   Neuro/Psych Depression Diabetic retinopathy    GI/Hepatic negative GI ROS, Neg liver ROS,   Endo/Other  diabetes, Insulin DependentMorbid obesity  Renal/GU ESRFRenal disease  negative genitourinary   Musculoskeletal  (+) Arthritis ,   Abdominal (+) + obese,   Peds  Hematology  (+) anemia ,   Anesthesia Other Findings K 5.1  Reproductive/Obstetrics                            Anesthesia Physical Anesthesia Plan  ASA: IV  Anesthesia Plan: General   Post-op Pain Management:    Induction: Intravenous  PONV Risk Score and Plan: 2 and Treatment may vary due to age or medical condition and Ondansetron  Airway Management Planned: LMA  Additional Equipment: None  Intra-op Plan:   Post-operative Plan: Extubation in OR  Informed Consent: I have reviewed the patients History and Physical, chart, labs and discussed the procedure including the risks, benefits and alternatives for the proposed anesthesia with the patient or authorized representative who has indicated his/her understanding and acceptance.   Dental advisory given  Plan Discussed with: CRNA  Anesthesia Plan Comments:         Anesthesia Quick Evaluation

## 2017-12-31 NOTE — Discharge Instructions (Signed)
° °  [facility] [place] ° °Discharge Instructions ° °AV Fistula or Graft Surgery for Dialysis Access ° °Please refer to the following instructions for your post-procedure care. Your surgeon or physician assistant will discuss any changes with you. ° °Activity ° °You may drive the day following your surgery, if you are comfortable and no longer taking prescription pain medication. Resume full activity as the soreness in your incision resolves. ° °Bathing/Showering ° °You may shower after you go home. Keep your incision dry for 48 hours. Do not soak in a bathtub, hot tub, or swim until the incision heals completely. You may not shower if you have a hemodialysis catheter. ° °Incision Care ° °Clean your incision with mild soap and water after 48 hours. Pat the area dry with a clean towel. You do not need a bandage unless otherwise instructed. Do not apply any ointments or creams to your incision. You may have skin glue on your incision. Do not peel it off. It will come off on its own in about one week. Your arm may swell a bit after surgery. To reduce swelling use pillows to elevate your arm so it is above your heart. Your doctor will tell you if you need to lightly wrap your arm with an ACE bandage. ° °Diet ° °Resume your normal diet. There are not special food restrictions following this procedure. In order to heal from your surgery, it is CRITICAL to get adequate nutrition. Your body requires vitamins, minerals, and protein. Vegetables are the best source of vitamins and minerals. Vegetables also provide the perfect balance of protein. Processed food has little nutritional value, so try to avoid this. ° °Medications ° °Resume taking all of your medications. If your incision is causing pain, you may take over-the counter pain relievers such as acetaminophen (Tylenol). If you were prescribed a stronger pain medication, please be aware these medications can cause nausea and constipation. Prevent  nausea by taking the medication with a snack or meal. Avoid constipation by drinking plenty of fluids and eating foods with high amount of fiber, such as fruits, vegetables, and grains. Do not take Tylenol if you are taking prescription pain medications. ° ° ° ° °Follow up °Your surgeon may want to see you in the office following your access surgery. If so, this will be arranged at the time of your surgery. ° °Please call us immediately for any of the following conditions: ° °Increased pain, redness, drainage (pus) from your incision site °Fever of 101 degrees or higher °Severe or worsening pain at your incision site °Hand pain or numbness. ° °Reduce your risk of vascular disease: ° °Stop smoking. If you would like help, call [facility] at 1-800-QUIT-NOW (1-800-784-8669) or [facility] at 336-586-4000 ° °Manage your cholesterol °Maintain a desired weight °Control your diabetes °Keep your blood pressure down ° °Dialysis ° °It will take several weeks to several months for your new dialysis access to be ready for use. Your surgeon will determine when it is OK to use it. Your nephrologist will continue to direct your dialysis. You can continue to use your Permcath until your new access is ready for use. ° °If you have any questions, please call the office at [phone]. ° °

## 2017-12-31 NOTE — Interval H&P Note (Signed)
History and Physical Interval Note:  12/31/2017 9:18 AM  Marc Wheeler  has presented today for surgery, with the diagnosis of chronic kidney disease  The various methods of treatment have been discussed with the patient and family. After consideration of risks, benefits and other options for treatment, the patient has consented to  Procedure(s): INSERTION OF ARTERIOVENOUS (AV) GORE-TEX GRAFT ARM (Left) as a surgical intervention .  The patient's history has been reviewed, patient examined, no change in status, stable for surgery.  I have reviewed the patient's chart and labs.  Questions were answered to the patient's satisfaction.     Marc Wheeler

## 2017-12-31 NOTE — Telephone Encounter (Signed)
-----   Message from Sharee PimpleMarilyn K McChesney, RN sent at 12/31/2017 12:04 PM EDT ----- Regarding: 3-4 weeks   ----- Message ----- From: Lars Mageollins, Emma M, PA-C Sent: 12/31/2017  11:20 AM To: [facility] Charge Pool  F/U in the office on PA Wednesday in 3-4 weeks check on fore arm av graft CKD stage IV not on HD yet s/p placement left graft by Dr. Edilia Boickson

## 2017-12-31 NOTE — Anesthesia Postprocedure Evaluation (Signed)
Anesthesia Post Note  Patient: Marc Wheeler  Procedure(s) Performed: INSERTION OF ARTERIOVENOUS (AV) GORE-TEX GRAFT ARM (Left Arm Lower)     Patient location during evaluation: PACU Anesthesia Type: General Level of consciousness: awake and alert Pain management: pain level controlled Vital Signs Assessment: post-procedure vital signs reviewed and stable Respiratory status: spontaneous breathing, nonlabored ventilation and respiratory function stable Cardiovascular status: blood pressure returned to baseline and stable Postop Assessment: no apparent nausea or vomiting Anesthetic complications: no    Last Vitals:  Vitals:   12/31/17 1205 12/31/17 1223  BP: 126/63 127/64  Pulse: 66 66  Resp: 18 18  Temp: 36.7 C   SpO2: 98% 100%    Last Pain:  Vitals:   12/31/17 1223  TempSrc:   PainSc: 0-No pain                 Beryle Lathehomas E Brock

## 2018-01-01 ENCOUNTER — Encounter (HOSPITAL_COMMUNITY): Payer: Self-pay | Admitting: Vascular Surgery

## 2018-01-03 ENCOUNTER — Encounter (HOSPITAL_COMMUNITY): Payer: Self-pay | Admitting: Emergency Medicine

## 2018-01-03 ENCOUNTER — Inpatient Hospital Stay (HOSPITAL_COMMUNITY)
Admission: EM | Admit: 2018-01-03 | Discharge: 2018-01-06 | DRG: 920 | Disposition: A | Discharge status: Skilled Nursing Facility | Payer: Medicare Other | Attending: Internal Medicine | Admitting: Internal Medicine

## 2018-01-03 ENCOUNTER — Other Ambulatory Visit: Payer: Self-pay

## 2018-01-03 DIAGNOSIS — I2510 Atherosclerotic heart disease of native coronary artery without angina pectoris: Secondary | ICD-10-CM | POA: Diagnosis present

## 2018-01-03 DIAGNOSIS — Z87891 Personal history of nicotine dependence: Secondary | ICD-10-CM

## 2018-01-03 DIAGNOSIS — T827XXA Infection and inflammatory reaction due to other cardiac and vascular devices, implants and grafts, initial encounter: Secondary | ICD-10-CM

## 2018-01-03 DIAGNOSIS — Z89511 Acquired absence of right leg below knee: Secondary | ICD-10-CM

## 2018-01-03 DIAGNOSIS — M79602 Pain in left arm: Secondary | ICD-10-CM | POA: Diagnosis not present

## 2018-01-03 DIAGNOSIS — E1122 Type 2 diabetes mellitus with diabetic chronic kidney disease: Secondary | ICD-10-CM | POA: Diagnosis present

## 2018-01-03 DIAGNOSIS — E261 Secondary hyperaldosteronism: Secondary | ICD-10-CM | POA: Diagnosis present

## 2018-01-03 DIAGNOSIS — I10 Essential (primary) hypertension: Secondary | ICD-10-CM | POA: Diagnosis present

## 2018-01-03 DIAGNOSIS — T8189XA Other complications of procedures, not elsewhere classified, initial encounter: Secondary | ICD-10-CM | POA: Diagnosis not present

## 2018-01-03 DIAGNOSIS — Z809 Family history of malignant neoplasm, unspecified: Secondary | ICD-10-CM

## 2018-01-03 DIAGNOSIS — E1121 Type 2 diabetes mellitus with diabetic nephropathy: Secondary | ICD-10-CM | POA: Diagnosis present

## 2018-01-03 DIAGNOSIS — N179 Acute kidney failure, unspecified: Secondary | ICD-10-CM | POA: Diagnosis present

## 2018-01-03 DIAGNOSIS — Z833 Family history of diabetes mellitus: Secondary | ICD-10-CM

## 2018-01-03 DIAGNOSIS — T819XXA Unspecified complication of procedure, initial encounter: Secondary | ICD-10-CM | POA: Diagnosis present

## 2018-01-03 DIAGNOSIS — N184 Chronic kidney disease, stage 4 (severe): Secondary | ICD-10-CM | POA: Diagnosis present

## 2018-01-03 DIAGNOSIS — Z89512 Acquired absence of left leg below knee: Secondary | ICD-10-CM

## 2018-01-03 DIAGNOSIS — Z992 Dependence on renal dialysis: Secondary | ICD-10-CM

## 2018-01-03 DIAGNOSIS — Z7982 Long term (current) use of aspirin: Secondary | ICD-10-CM

## 2018-01-03 DIAGNOSIS — E785 Hyperlipidemia, unspecified: Secondary | ICD-10-CM | POA: Diagnosis present

## 2018-01-03 DIAGNOSIS — I130 Hypertensive heart and chronic kidney disease with heart failure and stage 1 through stage 4 chronic kidney disease, or unspecified chronic kidney disease: Secondary | ICD-10-CM | POA: Diagnosis present

## 2018-01-03 DIAGNOSIS — E11319 Type 2 diabetes mellitus with unspecified diabetic retinopathy without macular edema: Secondary | ICD-10-CM | POA: Diagnosis present

## 2018-01-03 DIAGNOSIS — I5032 Chronic diastolic (congestive) heart failure: Secondary | ICD-10-CM | POA: Diagnosis present

## 2018-01-03 DIAGNOSIS — Z794 Long term (current) use of insulin: Secondary | ICD-10-CM

## 2018-01-03 MED ORDER — VANCOMYCIN HCL IN DEXTROSE 1-5 GM/200ML-% IV SOLN
1000.0000 mg | Freq: Once | INTRAVENOUS | Status: AC
  Administered 2018-01-04: 1000 mg via INTRAVENOUS
  Filled 2018-01-03: qty 200

## 2018-01-03 NOTE — ED Triage Notes (Signed)
Patient is CAOx4, had a dialysis graft placed on Tuesday, swelling to area started today.  Swelling is about 2x the normal size to left forearm, from wrist to above elbow.

## 2018-01-03 NOTE — ED Provider Notes (Signed)
[facility] EMERGENCY DEPARTMENT Provider Note   CSN: [id number] Arrival date & time: 01/03/18  2249     History   Chief Complaint Chief Complaint  Patient presents with  . Vascular Access Problem    HPI Marc Wheeler is a 77 y.o. male.  Patient with extensive past medical history including diabetes, hypertension, congestive heart failure, and end-stage renal disease approaching dialysis.  3 days ago, he underwent a dialysis graft placement in the left forearm by Dr. Edilia Bo.  Since that time he has had increased pain, swelling, and redness to the left forearm.  He denies any fevers or chills.  He denies any significant drainage.  He denies any specific injury or trauma.  Pain is worse with palpation and movement.  There are no alleviating factors.   The history is provided by the patient.    Past Medical History:  Diagnosis Date  . Anemia   . Angina pectoris (HCC)   . CHF (congestive heart failure) (HCC)   . Chronic kidney disease   . Coronary artery disease   . Depression   . Diabetes mellitus without complication (HCC)   . Diabetic retinopathy (HCC)   . Edema   . Hyperlipemia   . Hyperlipidemia   . Hyperlipidemia LDL goal <100 07/16/2014  . Hypertension   . Muscle weakness   . Neuropathy   . Secondary hyperaldosteronism (HCC)   . Vascular headache     Patient Active Problem List   Diagnosis Date Noted  . Dermatitis 12/25/2016  . Allergic rhinitis 11/20/2016  . CKD (chronic kidney disease), stage III (HCC) 08/16/2016  . Type II diabetes mellitus with nephropathy (HCC) 07/16/2016  . Dyslipidemia associated with type 2 diabetes mellitus (HCC) 02/10/2016  . 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 S/P bilateral BKA (below knee amputation) (HCC) 11/14/2014  . CKD stage 3 due to type 2 diabetes mellitus (HCC) 07/16/2014  . Hypertensive renal disease 03/05/2014  . Diabetic retinopathy (HCC) 01/21/2013  . CAD (coronary  artery disease) 01/21/2013  . Constipation 01/21/2013  . 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    Past Surgical History:  Procedure Laterality Date  . APPENDECTOMY    . AV FISTULA PLACEMENT Left 12/31/2017   Procedure: INSERTION OF ARTERIOVENOUS (AV) GORE-TEX GRAFT ARM;  Surgeon: Chuck Hint, MD;  Location: [facility] OR;  Service: Vascular;  Laterality: Left;  . LEG AMPUTATION BELOW KNEE Bilateral   . right retinal detachment repair  1 22 14        Home Medications    Prior to Admission medications   Medication Sig Start Date End Date Taking? Authorizing Provider  acetaminophen (TYLENOL) 325 MG tablet Take 650 mg by mouth every 4 (four) hours as needed (pain).     [provider]  Alum Hydroxide-Mag Trisilicate (GAVISCON) 80-14.2 MG CHEW Chew 2 tablets by mouth every 4 (four) hours as needed (for indigestion).    [provider]  amLODipine (NORVASC) 10 MG tablet Take 10 mg by mouth daily. Reported on 12/29/2015    [provider]  aspirin 81 MG tablet Take 81 mg by mouth daily.    [provider]  atorvastatin (LIPITOR) 80 MG tablet Take 80 mg by mouth daily at 6 PM.     [provider]  b complex-vitamin c-folic acid (NEPHRO-VITE) 0.8 MG TABS Take 0.8 mg by mouth at bedtime.    [provider]  calcitRIOL (ROCALTROL) 0.25 MCG capsule Take 0.25 mcg by mouth 3 (three) times a  week. On Monday; Wednesday; Friday    [provider]  Carboxymethylcellulose Sodium (REFRESH LIQUIGEL OP) Place 1 drop into both eyes 3 (three) times daily.    [provider]  cholecalciferol (VITAMIN D) 1000 units tablet Take 1,000 Units by mouth daily.    [provider]  clobetasol cream (TEMOVATE) 0.05 % Apply 1 application topically every 4 (four) hours as needed (rash). Apply to right BKA stump    [provider]  cloNIDine (CATAPRES - DOSED IN MG/24 HR) 0.2 mg/24hr patch Place 1 patch onto the skin once a week. Friday     [provider]  docusate sodium (COLACE) 100 MG capsule Take 100 mg by mouth daily.     [provider]  fish oil-omega-3 fatty acids 1000 MG capsule Take 1 g by mouth daily.    [provider]  furosemide (LASIX) 40 MG tablet Take 40 mg by mouth daily.    [provider]  glucagon (GLUCAGON EMERGENCY) 1 MG injection Inject 1 mg into the vein daily as needed (low blood glucose).    [provider]  guaiFENesin (ROBITUSSIN) 100 MG/5ML SOLN Take 10 mLs by mouth every 6 (six) hours as needed for cough or to loosen phlegm.    [provider]  hydrALAZINE (APRESOLINE) 25 MG tablet Take 25 mg by mouth 4 (four) times daily.    [provider]  Insulin Glargine (TOUJEO SOLOSTAR) 300 UNIT/ML SOPN Inject 60 Units into the skin at bedtime.     [provider]  insulin lispro (HUMALOG KWIKPEN) 100 UNIT/ML KiwkPen Inject 0-16 Units into the skin 3 (three) times daily before meals. Per sliding scale: <60 call MD, 0-150 = 0 units, 151-200 = 6 units, 201-250 = 8 units, 251-300 = 10 units, 301-350 = 12 units, 351-400 = 14 units, >400 = 16 units and call MD.    [provider]  linagliptin (TRADJENTA) 5 MG TABS tablet Take 5 mg by mouth daily.    [provider]  loratadine (CLARITIN) 10 MG tablet Take 10 mg by mouth daily as needed for allergies.    [provider]  metoprolol (LOPRESSOR) 100 MG tablet Take 100 mg by mouth 2 (two) times daily.    [provider]  oxyCODONE-acetaminophen (PERCOCET/ROXICET) 5-325 MG tablet Take 1 tablet by mouth every 6 (six) hours as needed. 12/31/17   Lars Mage, PA-C  polyethylene glycol (MIRALAX / GLYCOLAX) packet Take 17 g by mouth daily.    [provider]    Family History No family history on file.  Social History Social History   Tobacco Use  . Smoking status: Former Smoker    Packs/day: 5.00    Years: 45.00    Pack years: 225.00    Types: Cigars   . Smokeless tobacco: Never Used  Substance Use Topics  . Alcohol use: No  . Drug use: No     Allergies   Patient has no known allergies.   Review of Systems Review of Systems  All other systems reviewed and are negative.    Physical Exam Updated Vital Signs BP 130/64 (BP Location: Right Arm)   Pulse 93   Temp 99.2 F (37.3 C) (Oral)   Resp 16   SpO2 98%   Physical Exam  Constitutional: He is oriented to person, place, and time. He appears well-developed and well-nourished. No distress.  HENT:  Head: Normocephalic and atraumatic.  Mouth/Throat: Oropharynx is clear and moist.  Neck: Normal range  of motion. Neck supple.  Cardiovascular: Normal rate and regular rhythm. Exam reveals no friction rub.  No murmur heard. Pulmonary/Chest: Effort normal and breath sounds normal. No respiratory distress. He has no wheezes. He has no rales.  Abdominal: Soft. Bowel sounds are normal. He exhibits no distension. There is no tenderness.  Musculoskeletal: Normal range of motion. He exhibits no edema.  The left forearm reveals an incision near the medial aspect of the left elbow.  There is erythema, warmth, and tenderness extending into the forearm and upper bicep area.  Ulnar and radial pulses are easily palpable.  He has good range of motion and motor and sensation are intact to all fingers.  Neurological: He is alert and oriented to person, place, and time. Coordination normal.  Skin: Skin is warm and dry. He is not diaphoretic.  Nursing note and vitals reviewed.      ED Treatments / Results  Labs (all labs ordered are listed, but only abnormal results are displayed) Labs Reviewed  BASIC METABOLIC PANEL  CBC WITH DIFFERENTIAL/PLATELET    EKG  EKG Interpretation None       Radiology No results found.  Procedures Procedures (including critical care time)  Medications Ordered in ED Medications  vancomycin (VANCOCIN) IVPB 1000 mg/200 mL premix (not administered)      Initial Impression / Assessment and Plan / ED Course  I have reviewed the triage vital signs and the nursing notes.  Pertinent labs & imaging results that were available during my care of the patient were reviewed by me and considered in my medical decision making (see chart for details).  Patient with what appears to be an infected dialysis graft in the left forearm.  He was given vancomycin.  I have spoken with Dr. Myra GianottiBrabham from vascular surgery who is recommending admission to medicine.  I have also spoken with Dr. Katrinka BlazingSmith from the hospitalist service who agrees to admit.  Final Clinical Impressions(s) / ED Diagnoses   Final diagnoses:  None    ED Discharge Orders    None       Geoffery Lyonselo, Kyrie Fludd, MD 01/04/18 [phone]

## 2018-01-04 ENCOUNTER — Encounter (HOSPITAL_COMMUNITY): Payer: Self-pay | Admitting: Internal Medicine

## 2018-01-04 ENCOUNTER — Inpatient Hospital Stay (HOSPITAL_COMMUNITY): Payer: Medicare Other

## 2018-01-04 DIAGNOSIS — Z7982 Long term (current) use of aspirin: Secondary | ICD-10-CM | POA: Diagnosis not present

## 2018-01-04 DIAGNOSIS — I5032 Chronic diastolic (congestive) heart failure: Secondary | ICD-10-CM | POA: Diagnosis present

## 2018-01-04 DIAGNOSIS — M7989 Other specified soft tissue disorders: Secondary | ICD-10-CM | POA: Diagnosis not present

## 2018-01-04 DIAGNOSIS — Z89512 Acquired absence of left leg below knee: Secondary | ICD-10-CM | POA: Diagnosis not present

## 2018-01-04 DIAGNOSIS — T827XXA Infection and inflammatory reaction due to other cardiac and vascular devices, implants and grafts, initial encounter: Secondary | ICD-10-CM

## 2018-01-04 DIAGNOSIS — Z89511 Acquired absence of right leg below knee: Secondary | ICD-10-CM

## 2018-01-04 DIAGNOSIS — Z992 Dependence on renal dialysis: Secondary | ICD-10-CM | POA: Diagnosis not present

## 2018-01-04 DIAGNOSIS — E11319 Type 2 diabetes mellitus with unspecified diabetic retinopathy without macular edema: Secondary | ICD-10-CM | POA: Diagnosis present

## 2018-01-04 DIAGNOSIS — E261 Secondary hyperaldosteronism: Secondary | ICD-10-CM | POA: Diagnosis present

## 2018-01-04 DIAGNOSIS — E1121 Type 2 diabetes mellitus with diabetic nephropathy: Secondary | ICD-10-CM

## 2018-01-04 DIAGNOSIS — E785 Hyperlipidemia, unspecified: Secondary | ICD-10-CM | POA: Diagnosis present

## 2018-01-04 DIAGNOSIS — N184 Chronic kidney disease, stage 4 (severe): Secondary | ICD-10-CM | POA: Diagnosis not present

## 2018-01-04 DIAGNOSIS — N179 Acute kidney failure, unspecified: Secondary | ICD-10-CM | POA: Diagnosis present

## 2018-01-04 DIAGNOSIS — I10 Essential (primary) hypertension: Secondary | ICD-10-CM | POA: Diagnosis not present

## 2018-01-04 DIAGNOSIS — T819XXA Unspecified complication of procedure, initial encounter: Secondary | ICD-10-CM | POA: Diagnosis present

## 2018-01-04 DIAGNOSIS — M79609 Pain in unspecified limb: Secondary | ICD-10-CM | POA: Diagnosis not present

## 2018-01-04 DIAGNOSIS — Z794 Long term (current) use of insulin: Secondary | ICD-10-CM | POA: Diagnosis not present

## 2018-01-04 DIAGNOSIS — T8189XA Other complications of procedures, not elsewhere classified, initial encounter: Secondary | ICD-10-CM | POA: Diagnosis present

## 2018-01-04 DIAGNOSIS — E1122 Type 2 diabetes mellitus with diabetic chronic kidney disease: Secondary | ICD-10-CM | POA: Diagnosis present

## 2018-01-04 DIAGNOSIS — Z809 Family history of malignant neoplasm, unspecified: Secondary | ICD-10-CM | POA: Diagnosis not present

## 2018-01-04 DIAGNOSIS — I130 Hypertensive heart and chronic kidney disease with heart failure and stage 1 through stage 4 chronic kidney disease, or unspecified chronic kidney disease: Secondary | ICD-10-CM | POA: Diagnosis present

## 2018-01-04 DIAGNOSIS — M79602 Pain in left arm: Secondary | ICD-10-CM | POA: Diagnosis present

## 2018-01-04 DIAGNOSIS — Z87891 Personal history of nicotine dependence: Secondary | ICD-10-CM | POA: Diagnosis not present

## 2018-01-04 DIAGNOSIS — Z833 Family history of diabetes mellitus: Secondary | ICD-10-CM | POA: Diagnosis not present

## 2018-01-04 DIAGNOSIS — I2510 Atherosclerotic heart disease of native coronary artery without angina pectoris: Secondary | ICD-10-CM | POA: Diagnosis present

## 2018-01-04 LAB — CBC WITH DIFFERENTIAL/PLATELET
BASOS ABS: 0 10*3/uL (ref 0.0–0.1)
Basophils Relative: 0 %
EOS ABS: 0 10*3/uL (ref 0.0–0.7)
Eosinophils Relative: 0 %
HCT: 33.1 % — ABNORMAL LOW (ref 39.0–52.0)
Hemoglobin: 10.7 g/dL — ABNORMAL LOW (ref 13.0–17.0)
LYMPHS ABS: 1.1 10*3/uL (ref 0.7–4.0)
Lymphocytes Relative: 9 %
MCH: 26.8 pg (ref 26.0–34.0)
MCHC: 32.3 g/dL (ref 30.0–36.0)
MCV: 83 fL (ref 78.0–100.0)
Monocytes Absolute: 1.1 10*3/uL — ABNORMAL HIGH (ref 0.1–1.0)
Monocytes Relative: 9 %
NEUTROS ABS: 10.4 10*3/uL — AB (ref 1.7–7.7)
Neutrophils Relative %: 82 %
Platelets: 152 10*3/uL (ref 150–400)
RBC: 3.99 MIL/uL — ABNORMAL LOW (ref 4.22–5.81)
RDW: 17.2 % — AB (ref 11.5–15.5)
WBC: 12.6 10*3/uL — ABNORMAL HIGH (ref 4.0–10.5)

## 2018-01-04 LAB — BASIC METABOLIC PANEL
Anion gap: 11 (ref 5–15)
Anion gap: 10 (ref 5–15)
BUN: 32 mg/dL — AB (ref 6–20)
BUN: 32 mg/dL — AB (ref 6–20)
CALCIUM: 8.9 mg/dL (ref 8.9–10.3)
CALCIUM: 9.1 mg/dL (ref 8.9–10.3)
CHLORIDE: 112 mmol/L — AB (ref 101–111)
CO2: 18 mmol/L — ABNORMAL LOW (ref 22–32)
CO2: 16 mmol/L — AB (ref 22–32)
CREATININE: 2.24 mg/dL — AB (ref 0.61–1.24)
CREATININE: 2.54 mg/dL — AB (ref 0.61–1.24)
Chloride: 111 mmol/L (ref 101–111)
GFR calc Af Amer: 26 mL/min — ABNORMAL LOW (ref >60)
GFR calc non Af Amer: 27 mL/min — ABNORMAL LOW (ref >60)
GFR, EST AFRICAN AMERICAN: 31 mL/min — AB (ref >60)
GFR, EST NON AFRICAN AMERICAN: 23 mL/min — AB (ref >60)
GLUCOSE: 113 mg/dL — AB (ref 65–99)
Glucose, Bld: 173 mg/dL — ABNORMAL HIGH (ref 65–99)
Potassium: 4.1 mmol/L (ref 3.5–5.1)
Potassium: 4.5 mmol/L (ref 3.5–5.1)
Sodium: 140 mmol/L (ref 135–145)
Sodium: 138 mmol/L (ref 135–145)

## 2018-01-04 LAB — CBC
HEMATOCRIT: 33.3 % — AB (ref 39.0–52.0)
HEMOGLOBIN: 10.6 g/dL — AB (ref 13.0–17.0)
MCH: 26.8 pg (ref 26.0–34.0)
MCHC: 31.8 g/dL (ref 30.0–36.0)
MCV: 84.1 fL (ref 78.0–100.0)
Platelets: 178 10*3/uL (ref 150–400)
RBC: 3.96 MIL/uL — ABNORMAL LOW (ref 4.22–5.81)
RDW: 17.5 % — AB (ref 11.5–15.5)
WBC: 10.9 10*3/uL — ABNORMAL HIGH (ref 4.0–10.5)

## 2018-01-04 LAB — GLUCOSE, CAPILLARY
GLUCOSE-CAPILLARY: 164 mg/dL — AB (ref 65–99)
GLUCOSE-CAPILLARY: 154 mg/dL — AB (ref 65–99)
Glucose-Capillary: 186 mg/dL — ABNORMAL HIGH (ref 65–99)

## 2018-01-04 LAB — MRSA PCR SCREENING: MRSA by PCR: NEGATIVE

## 2018-01-04 LAB — D-DIMER, QUANTITATIVE (NOT AT ARMC): D DIMER QUANT: 9.9 ug{FEU}/mL — AB (ref 0.00–0.50)

## 2018-01-04 MED ORDER — INSULIN ASPART 100 UNIT/ML ~~LOC~~ SOLN
0.0000 [IU] | Freq: Three times a day (TID) | SUBCUTANEOUS | Status: DC
Start: 2018-01-04 — End: 2018-01-06
  Administered 2018-01-04: 6 [IU] via SUBCUTANEOUS

## 2018-01-04 MED ORDER — ONDANSETRON HCL 4 MG PO TABS: 4.0000 mg | ORAL_TABLET | Freq: Four times a day (QID) | ORAL | Status: DC | PRN

## 2018-01-04 MED ORDER — ACETAMINOPHEN 650 MG RE SUPP: 650.0000 mg | Freq: Four times a day (QID) | RECTAL | Status: DC | PRN

## 2018-01-04 MED ORDER — VANCOMYCIN HCL IN DEXTROSE 750-5 MG/150ML-% IV SOLN
750.0000 mg | INTRAVENOUS | Status: DC
  Administered 2018-01-04 – 2018-01-06 (×2): 750 mg via INTRAVENOUS
  Filled 2018-01-04 (×3): qty 150

## 2018-01-04 MED ORDER — METOPROLOL TARTRATE 50 MG PO TABS
100.0000 mg | ORAL_TABLET | Freq: Two times a day (BID) | ORAL | Status: DC
  Administered 2018-01-04 – 2018-01-06 (×4): 100 mg via ORAL
  Filled 2018-01-04 (×5): qty 2

## 2018-01-04 MED ORDER — POLYVINYL ALCOHOL 1.4 % OP SOLN
1.0000 [drp] | Freq: Three times a day (TID) | OPHTHALMIC | Status: DC
  Administered 2018-01-04 – 2018-01-05 (×4): 1 [drp] via OPHTHALMIC
  Filled 2018-01-04: qty 15

## 2018-01-04 MED ORDER — CLOBETASOL PROPIONATE 0.05 % EX CREA
1.0000 | TOPICAL_CREAM | CUTANEOUS | Status: DC | PRN
Start: 2018-01-04 — End: 2018-01-06
  Filled 2018-01-04: qty 15

## 2018-01-04 MED ORDER — IPRATROPIUM-ALBUTEROL 0.5-2.5 (3) MG/3ML IN SOLN: 3.0000 mL | Freq: Four times a day (QID) | RESPIRATORY_TRACT | Status: DC | PRN

## 2018-01-04 MED ORDER — ATORVASTATIN CALCIUM 80 MG PO TABS
80.0000 mg | ORAL_TABLET | Freq: Every day | ORAL | Status: DC
  Administered 2018-01-05: 80 mg via ORAL
  Filled 2018-01-04: qty 1

## 2018-01-04 MED ORDER — DOCUSATE SODIUM 100 MG PO CAPS
100.0000 mg | ORAL_CAPSULE | Freq: Every day | ORAL | Status: DC
  Administered 2018-01-04 – 2018-01-06 (×3): 100 mg via ORAL
  Filled 2018-01-04 (×3): qty 1

## 2018-01-04 MED ORDER — HYDRALAZINE HCL 20 MG/ML IJ SOLN: 10.0000 mg | INTRAMUSCULAR | Status: DC | PRN

## 2018-01-04 MED ORDER — INSULIN GLARGINE 300 UNIT/ML ~~LOC~~ SOPN: 60.0000 [IU] | PEN_INJECTOR | Freq: Every day | SUBCUTANEOUS | Status: DC

## 2018-01-04 MED ORDER — ONDANSETRON HCL 4 MG/2ML IJ SOLN: 4.0000 mg | Freq: Four times a day (QID) | INTRAMUSCULAR | Status: DC | PRN

## 2018-01-04 MED ORDER — ACETAMINOPHEN 325 MG PO TABS: 650.0000 mg | ORAL_TABLET | Freq: Four times a day (QID) | ORAL | Status: DC | PRN

## 2018-01-04 MED ORDER — ACETAMINOPHEN 325 MG PO TABS: 650.0000 mg | ORAL_TABLET | ORAL | Status: DC | PRN

## 2018-01-04 MED ORDER — OXYCODONE-ACETAMINOPHEN 5-325 MG PO TABS: 1.0000 | ORAL_TABLET | Freq: Four times a day (QID) | ORAL | Status: DC | PRN

## 2018-01-04 MED ORDER — ALUM & MAG HYDROXIDE-SIMETH 200-200-20 MG/5ML PO SUSP: 30.0000 mL | ORAL | Status: DC | PRN

## 2018-01-04 MED ORDER — CALCITRIOL 0.25 MCG PO CAPS
0.2500 ug | ORAL_CAPSULE | ORAL | Status: DC
  Filled 2018-01-04: qty 1

## 2018-01-04 MED ORDER — AMLODIPINE BESYLATE 10 MG PO TABS
10.0000 mg | ORAL_TABLET | Freq: Every day | ORAL | Status: DC
  Administered 2018-01-04 – 2018-01-06 (×3): 10 mg via ORAL
  Filled 2018-01-04 (×3): qty 1

## 2018-01-04 MED ORDER — LORATADINE 10 MG PO TABS: 10.0000 mg | ORAL_TABLET | Freq: Every day | ORAL | Status: DC | PRN

## 2018-01-04 MED ORDER — GUAIFENESIN 100 MG/5ML PO SOLN
10.0000 mL | Freq: Four times a day (QID) | ORAL | Status: DC | PRN
  Filled 2018-01-04: qty 10

## 2018-01-04 MED ORDER — ASPIRIN EC 81 MG PO TBEC
81.0000 mg | DELAYED_RELEASE_TABLET | Freq: Every day | ORAL | Status: DC
  Administered 2018-01-04 – 2018-01-06 (×3): 81 mg via ORAL
  Filled 2018-01-04 (×3): qty 1

## 2018-01-04 MED ORDER — HEPARIN SODIUM (PORCINE) 5000 UNIT/ML IJ SOLN
5000.0000 [IU] | Freq: Three times a day (TID) | INTRAMUSCULAR | Status: DC
  Administered 2018-01-04 – 2018-01-06 (×4): 5000 [IU] via SUBCUTANEOUS
  Filled 2018-01-04 (×5): qty 1

## 2018-01-04 MED ORDER — INSULIN GLARGINE 100 UNIT/ML ~~LOC~~ SOLN
30.0000 [IU] | Freq: Every day | SUBCUTANEOUS | Status: DC
  Administered 2018-01-04: 30 [IU] via SUBCUTANEOUS
  Filled 2018-01-04 (×3): qty 0.3

## 2018-01-04 MED ORDER — POLYETHYLENE GLYCOL 3350 17 G PO PACK
17.0000 g | PACK | Freq: Every day | ORAL | Status: DC
  Administered 2018-01-04: 17 g via ORAL
  Filled 2018-01-04 (×2): qty 1

## 2018-01-04 MED ORDER — HYDRALAZINE HCL 25 MG PO TABS
25.0000 mg | ORAL_TABLET | Freq: Three times a day (TID) | ORAL | Status: DC
  Administered 2018-01-04 – 2018-01-06 (×5): 25 mg via ORAL
  Filled 2018-01-04 (×7): qty 1

## 2018-01-04 MED ORDER — CLONIDINE HCL 0.2 MG/24HR TD PTWK: 0.2000 mg | MEDICATED_PATCH | TRANSDERMAL | Status: DC

## 2018-01-04 NOTE — Progress Notes (Signed)
Seen and Evaluated on rounds.  Clinical database including lab work reviewed and noted.  Marc Wheeler is a 77 y.o. male with medical history significant for but not limited to CKD stage IV status post recent left upper extremity AV graft placement on 12/31/2017 by Dr. Evangeline Dakinicks of vascular surgeryon presented with ipsilateral arm swelling and redness concerning for soft tissue infection.  Dr. Myra GianottiBrabham had vascular surgery, consulted and will see the patient.   Patient  started on empiric antibiotics of vancomycin.

## 2018-01-04 NOTE — Progress Notes (Signed)
Pharmacy Antibiotic Note  Marc Wheeler is a 77 y.o. male admitted on 01/03/2018 with cellulitis.  Pharmacy has been consulted for Vancomycin dosing. Recent surgery for HD graft on 3/12. WBC 12.6. Noted renal dysfunction.   Plan: Vancomycin 750 mg IV q24h Trend WBC, temp, renal function  F/U infectious work-up Drug levels as indicated  Temp (24hrs), Avg:99.2 F (37.3 C), Min:99.2 F (37.3 C), Max:99.2 F (37.3 C)  Recent Labs  Lab 01/03/18 2339  WBC 12.6*  CREATININE 2.54*    CrCl cannot be calculated (Unknown ideal weight.).    No Known Allergies   Abran DukeLedford, Leslieanne Cobarrubias 01/04/2018 3:17 AM

## 2018-01-04 NOTE — Progress Notes (Signed)
VASCULAR LAB PRELIMINARY  PRELIMINARY  PRELIMINARY  PRELIMINARY  Left upper extremity venous duplex completed.    Preliminary report:  There is no DVT or SVT noted in the left upper extremity.  Incidentally, there is no perigraft fluid noted.    Madysin Crisp, RVT 01/04/2018, 6:02 PM

## 2018-01-04 NOTE — H&P (Addendum)
History and Physical    Marc Wheeler RUE:[id number] DOB: 08/15/1941 DOA: 01/03/2018  Referring MD/NP/PA: Dr. Judd Lien PCP: System, Pcp Not In  Patient coming from: From nursing home  Chief Complaint: Left arm swelling  I have personally briefly reviewed patient's old medical records in [facility]   HPI: Marc Wheeler is a 77 y.o. male with medical history significant of HTN, HLD, diastolic CHF, CKD stage IV, anemia, DM type II, s/p bilateral BKA; who presents left arm swelling, redness, and pain.  Patient seems to be a poor historian.  He is status post left upper extremity arteriovenous Gore-Tex graft placement in the left arm on 3/12 by Dr. Jen Mow of vascular surgery.  He has not noted any significant drainage, but reports that the arm is painful to be touched or moved.  Denies having any significant fever, nausea, vomiting, diarrhea, shortness of breath, or chest pain.  ED Course: Patient presents with relatively normal vital signs.  Labs revealed WBC 12.6, BUN 32, creatinine 2.54, and all other labs appear near patient's baseline.  Dr. Myra Gianotti had vascular surgery, consulted and will see the patient.  Patient was started on empiric antibiotics of vancomycin.  Review of Systems  Constitutional: Negative for chills and fever.  HENT: Negative for nosebleeds and sore throat.   Eyes: Negative for photophobia and pain.  Respiratory: Negative for cough and shortness of breath.   Cardiovascular: Negative for chest pain and leg swelling.  Gastrointestinal: Negative for abdominal pain and diarrhea.  Genitourinary: Negative for dysuria and frequency.  Musculoskeletal: Negative for back pain and neck pain.       Positive for arm swelling and pain  Skin: Positive for rash.       Positive for skin color change  Neurological: Negative for sensory change and speech change.  Psychiatric/Behavioral: Negative for hallucinations and substance abuse.    Past Medical History:  Diagnosis  Date  . Anemia   . Angina pectoris (HCC)   . CHF (congestive heart failure) (HCC)   . Chronic kidney disease   . Coronary artery disease   . Depression   . Diabetes mellitus without complication (HCC)   . Diabetic retinopathy (HCC)   . Edema   . Hyperlipemia   . Hyperlipidemia   . Hyperlipidemia LDL goal <100 07/16/2014  . Hypertension   . Muscle weakness   . Neuropathy   . Secondary hyperaldosteronism (HCC)   . Vascular headache     Past Surgical History:  Procedure Laterality Date  . APPENDECTOMY    . AV FISTULA PLACEMENT Left 12/31/2017   Procedure: INSERTION OF ARTERIOVENOUS (AV) GORE-TEX GRAFT ARM;  Surgeon: Chuck Hint, MD;  Location: [facility] OR;  Service: Vascular;  Laterality: Left;  . LEG AMPUTATION BELOW KNEE Bilateral   . right retinal detachment repair  1 22 14      reports that he has quit smoking. His smoking use included cigars. He has a 225.00 pack-year smoking history. he has never used smokeless tobacco. He reports that he does not drink alcohol or use drugs.  No Known Allergies  Family History  Problem Relation Age of Onset  . Cancer Mother   . Diabetes Mother   . Cancer Sister     Prior to Admission medications   Medication Sig Start Date End Date Taking? Authorizing Provider  acetaminophen (TYLENOL) 325 MG tablet Take 650 mg by mouth every 4 (four) hours as needed (pain).     [provider]  Alum Hydroxide-Mag Trisilicate (  GAVISCON) 80-14.2 MG CHEW Chew 2 tablets by mouth every 4 (four) hours as needed (for indigestion).    [provider]  amLODipine (NORVASC) 10 MG tablet Take 10 mg by mouth daily. Reported on 12/29/2015    [provider]  aspirin 81 MG tablet Take 81 mg by mouth daily.    [provider]  atorvastatin (LIPITOR) 80 MG tablet Take 80 mg by mouth daily at 6 PM.     [provider]  b complex-vitamin c-folic acid (NEPHRO-VITE) 0.8 MG TABS Take 0.8 mg by mouth at bedtime.    [provider]  calcitRIOL (ROCALTROL) 0.25 MCG capsule Take 0.25 mcg by mouth 3 (three) times a week. On Monday; Wednesday; Friday    [provider]  Carboxymethylcellulose Sodium (REFRESH LIQUIGEL OP) Place 1 drop into both eyes 3 (three) times daily.    [provider]  cholecalciferol (VITAMIN D) 1000 units tablet Take 1,000 Units by mouth daily.    [provider]  clobetasol cream (TEMOVATE) 0.05 % Apply 1 application topically every 4 (four) hours as needed (rash). Apply to right BKA stump    [provider]  cloNIDine (CATAPRES - DOSED IN MG/24 HR) 0.2 mg/24hr patch Place 1 patch onto the skin once a week. Friday    [provider]  docusate sodium (COLACE) 100 MG capsule Take 100 mg by mouth daily.     [provider]  fish oil-omega-3 fatty acids 1000 MG capsule Take 1 g by mouth daily.    [provider]  furosemide (LASIX) 40 MG tablet Take 40 mg by mouth daily.    [provider]  glucagon (GLUCAGON EMERGENCY) 1 MG injection Inject 1 mg into the vein daily as needed (low blood glucose).    [provider]  guaiFENesin (ROBITUSSIN) 100 MG/5ML SOLN Take 10 mLs by mouth every 6 (six) hours as needed for cough or to loosen phlegm.    [provider]  hydrALAZINE (APRESOLINE) 25 MG tablet Take 25 mg by mouth 4 (four) times daily.    [provider]  Insulin Glargine (TOUJEO SOLOSTAR) 300 UNIT/ML SOPN Inject 60 Units into the skin at bedtime.     [provider]  insulin lispro (HUMALOG KWIKPEN) 100 UNIT/ML KiwkPen Inject 0-16 Units into the skin 3 (three) times daily before meals. Per sliding scale: <60 call MD, 0-150 = 0 units, 151-200 = 6 units, 201-250 = 8 units, 251-300 = 10 units, 301-350 = 12 units, 351-400 = 14 units, >400 = 16 units and call MD.    [provider]  linagliptin (TRADJENTA) 5 MG TABS tablet Take 5 mg by mouth daily.    [provider]  loratadine  (CLARITIN) 10 MG tablet Take 10 mg by mouth daily as needed for allergies.    [provider]  metoprolol (LOPRESSOR) 100 MG tablet Take 100 mg by mouth 2 (two) times daily.    [provider]  oxyCODONE-acetaminophen (PERCOCET/ROXICET) 5-325 MG tablet Take 1 tablet by mouth every 6 (six) hours as needed. 12/31/17   Lars Mage, PA-C  polyethylene glycol (MIRALAX / GLYCOLAX) packet Take 17 g by mouth daily.    [provider]    Physical Exam:  Constitutional: Elderly male who appears to be in no acute distress at this time Vitals:   01/03/18 2255  BP: 130/64  Pulse: 93  Resp: 16  Temp: 99.2 F (37.3 C)  TempSrc: Oral  SpO2: 98%  Eyes: PERRL, lids and conjunctivae normal ENMT: Mucous membranes are dry. posterior pharynx clear of any exudate or lesions.  Neck: normal, supple, no masses, no thyromegaly Respiratory: clear to auscultation bilaterally, no wheezing, no crackles. Normal respiratory effort. No accessory muscle use.  Cardiovascular: Regular rate and rhythm, no murmurs / rubs / gallops.  Left upper extremity swelling and that appears twice the size of right arm.  No lower extremity edema noted.  2+ pedal pulses. No carotid bruits.   Abdomen: no tenderness, no masses palpated. No hepatosplenomegaly. Bowel sounds positive.  Musculoskeletal: no clubbing / cyanosis.  Bilateral BKA Skin: Rash noted of the bilateral lower extremity stumps.  Erythema and bruising noted of the left forearm with no signs of drainage near incision site as seen below.  Neurologic: CN 2-12 grossly intact. Sensation intact, DTR normal. Strength 5/5 in all 4.  Psychiatric: Normal judgment and insight. Alert and oriented x 3. Normal mood.     Labs on Admission: I have personally reviewed following labs and imaging studies  CBC: Recent Labs  Lab 12/31/17 0826 01/03/18 2339  WBC  --  12.6*  NEUTROABS  --  10.4*  HGB 10.9* 10.7*  HCT 32.0* 33.1*  MCV  --  83.0  PLT  --   152   Basic Metabolic Panel: Recent Labs  Lab 12/31/17 0826 01/03/18 2339  NA 145 140  K 5.1 4.1  CL  --  111  CO2  --  18*  GLUCOSE 87 113*  BUN  --  32*  CREATININE  --  2.54*  CALCIUM  --  9.1   GFR: CrCl cannot be calculated (Unknown ideal weight.). Liver Function Tests: No results for input(s): AST, ALT, ALKPHOS, BILITOT, PROT, ALBUMIN in the last 168 hours. No results for input(s): LIPASE, AMYLASE in the last 168 hours. No results for input(s): AMMONIA in the last 168 hours. Coagulation Profile: No results for input(s): INR, PROTIME in the last 168 hours. Cardiac Enzymes: No results for input(s): CKTOTAL, CKMB, CKMBINDEX, TROPONINI in the last 168 hours. BNP (last 3 results) No results for input(s): PROBNP in the last 8760 hours. HbA1C: No results for input(s): HGBA1C in the last 72 hours. CBG: Recent Labs  Lab 12/31/17 0749 12/31/17 1123  GLUCAP 88 80   Lipid Profile: No results for input(s): CHOL, HDL, LDLCALC, TRIG, CHOLHDL, LDLDIRECT in the last 72 hours. Thyroid Function Tests: No results for input(s): TSH, T4TOTAL, FREET4, T3FREE, THYROIDAB in the last 72 hours. Anemia Panel: No results for input(s): VITAMINB12, FOLATE, FERRITIN, TIBC, IRON, RETICCTPCT in the last 72 hours. Urine analysis:    Component Value Date/Time   COLORURINE YELLOW 11/29/2014 1524   APPEARANCEUR CLEAR 11/29/2014 1524   LABSPEC 1.012 11/29/2014 1524   PHURINE 5.5 11/29/2014 1524   GLUCOSEU NEG 11/29/2014 1524   HGBUR NEG 11/29/2014 1524   BILIRUBINUR NEG 11/29/2014 1524   KETONESUR NEG 11/29/2014 1524   PROTEINUR NEG 11/29/2014 1524   UROBILINOGEN 0.2 11/29/2014 1524   NITRITE NEG 11/29/2014 1524   LEUKOCYTESUR NEG 11/29/2014 1524   Sepsis Labs: No results found for this or any previous visit (from the past 240 hour(s)).   Radiological Exams on Admission: No results found.    Assessment/Plan Post operative complication of left AV fistula graft, question  cellulitis/graft infection: Acute.  Patient presents with progressively worsening left upper extremity swelling, erythema, and pain 3 days postop.  Vascular surgery consulted.  Patient started on empiric antibiotics of vancomycin. - Admit to a telemetry  bed - Follow-up blood culture - Continue empiric antibiotics of vancomycin - Check d-dimer stat  - Vascular Doppler ultrasound - Appreciate vascular consultative services, will follow-up for further recommendations  Chronic kidney disease stage IV: Still reports being able to make urine.  Creatinine appears to be near baseline of which appears around 2.4.  No significant electrolyte derangements noted. - Continue to monitor  Diabetes mellitus type 2 : Patient with complications of diabetic retinopathy. - Hypoglycemic protocol - Continue reduced dose of home insulin regimen  Essential hypertension - Continue clonidine, metoprolol, hydralazine, amlodipine - Restart furosemide when medically appropriate  Hyperlipidemia - Continue Lipitor  S/p bilateral BKA  DVT prophylaxis: Heparin Code Status:  full Family Communication: No family present at bedside Disposition Plan: Likely discharge to nursing facility once medically stable Consults called: Vascular surgery admission status: inpatient  Clydie Braun MD [facility] Pager [phone]   If 7PM-7AM, please contact night-coverage [email or web address] Password [email or web address]  01/04/2018, 2:46 AM   \

## 2018-01-04 NOTE — ED Notes (Signed)
Attempted to gain IV access x2, without success.  

## 2018-01-05 DIAGNOSIS — T827XXA Infection and inflammatory reaction due to other cardiac and vascular devices, implants and grafts, initial encounter: Secondary | ICD-10-CM

## 2018-01-05 LAB — BASIC METABOLIC PANEL
Anion gap: 10 (ref 5–15)
BUN: 33 mg/dL — ABNORMAL HIGH (ref 6–20)
CHLORIDE: 114 mmol/L — AB (ref 101–111)
CO2: 16 mmol/L — AB (ref 22–32)
Calcium: 8.9 mg/dL (ref 8.9–10.3)
Creatinine, Ser: 2.02 mg/dL — ABNORMAL HIGH (ref 0.61–1.24)
GFR calc Af Amer: 35 mL/min — ABNORMAL LOW (ref >60)
GFR calc non Af Amer: 30 mL/min — ABNORMAL LOW (ref >60)
GLUCOSE: 111 mg/dL — AB (ref 65–99)
POTASSIUM: 5.1 mmol/L (ref 3.5–5.1)
SODIUM: 140 mmol/L (ref 135–145)

## 2018-01-05 LAB — CBC
HEMATOCRIT: 31.2 % — AB (ref 39.0–52.0)
HEMOGLOBIN: 9.9 g/dL — AB (ref 13.0–17.0)
MCH: 26.3 pg (ref 26.0–34.0)
MCHC: 31.7 g/dL (ref 30.0–36.0)
MCV: 82.8 fL (ref 78.0–100.0)
Platelets: 181 10*3/uL (ref 150–400)
RBC: 3.77 MIL/uL — ABNORMAL LOW (ref 4.22–5.81)
RDW: 17.1 % — ABNORMAL HIGH (ref 11.5–15.5)
WBC: 15.1 10*3/uL — ABNORMAL HIGH (ref 4.0–10.5)

## 2018-01-05 LAB — GLUCOSE, CAPILLARY
GLUCOSE-CAPILLARY: 126 mg/dL — AB (ref 65–99)
GLUCOSE-CAPILLARY: 138 mg/dL — AB (ref 65–99)
Glucose-Capillary: 122 mg/dL — ABNORMAL HIGH (ref 65–99)
Glucose-Capillary: 123 mg/dL — ABNORMAL HIGH (ref 65–99)
Glucose-Capillary: 138 mg/dL — ABNORMAL HIGH (ref 65–99)

## 2018-01-05 NOTE — Progress Notes (Signed)
PROGRESS NOTE    Marc GALLAGA  Wheeler:[id number] DOB: 02/08/1941 DOA: 01/03/2018 PCP: System, Pcp Not In Outpatient Specialists:    Brief Narrative:   Marc Donahoe Morrisonis a 77 y.o.malewith medical history significant for but not limited to CKD stage IV status post recent left upper extremity AV graft placement on 12/31/2017 by Dr. Evangeline Dakin of vascular surgeryon presented with ipsilateral arm swelling and redness concerning for soft tissue infection.    Assessment & Plan:   Principal Problem:   Post-operative complication Active Problems:   Essential hypertension   S/P bilateral BKA (below knee amputation) (HCC)   Type II diabetes mellitus with nephropathy (HCC)   CKD (chronic kidney disease), stage IV (HCC)   #1 postop complication for left AV fistula graft: Graft infection/cellulitis, rule out reaction to Gore-Tex Ultrasound left upper extremity negative for DVT, no fluid noted Continue empiric antibiotics Follow blood cultures Vascular surgery consulting   #2 type 2 diabetes mellitus: Glycemic control with insulin  #3 CKD 4: Not a hemodialysis Follow clinically       DVT prophylaxis: Heparin Code Status: Full code Family Communication:  Disposition Plan: To be determined-likely to nursing home   Consultants:  Vascular surgery  Procedures:  Left upper extremity ultrasound Antimicrobials:  Vancomycin Subjective: Patient is feeling better today-states pain is better and swelling is limited by as well.  Denies any fever chills  Objective: Vitals:   01/05/18 0000 01/05/18 0300 01/05/18 0400 01/05/18 0700  BP: (!) 146/107  124/65   Pulse: 81  78   Resp: (!) 22  16   Temp: 99.1 F (37.3 C) (!) 97.4 F (36.3 C)  (!) 97.2 F (36.2 C)  TempSrc: Oral Oral    SpO2: 100%  98%   Weight:        Intake/Output Summary (Last 24 hours) at 01/05/2018 1036 Last data filed at 01/05/2018 0533 Gross per 24 hour  Intake 510 ml  Output 2 ml  Net 508 ml   Filed  Weights   01/04/18 0552  Weight: 76.9 kg (169 lb 8.5 oz)    Examination:  General exam: Appears calm and comfortable  Respiratory system: Clear to auscultation. Respiratory effort normal. Cardiovascular system: S1 & S2 heard, RRR. No JVD, murmurs, rubs, gallops or clicks. No pedal edema. Gastrointestinal system: Abdomen is nondistended, soft and nontender. No organomegaly or masses felt. Normal bowel sounds heard. Central nervous system: Alert and oriented. No focal neurological deficits. Extremities: Left upper extremity swelling and redness involving the area of both bleeding about the elbow, minimal drainage at incision site, bilateral BKA Skin: No rashes, lesions or ulcers Psychiatry: Judgement and insight appear normal. Mood & affect appropriate.     Data Reviewed: I have personally reviewed following labs and imaging studies  CBC: Recent Labs  Lab 12/31/17 0826 01/03/18 2339 01/04/18 0908  WBC  --  12.6* 10.9*  NEUTROABS  --  10.4*  --   HGB 10.9* 10.7* 10.6*  HCT 32.0* 33.1* 33.3*  MCV  --  83.0 84.1  PLT  --  152 178   Basic Metabolic Panel: Recent Labs  Lab 12/31/17 0826 01/03/18 2339 01/04/18 0908 01/05/18 0403  NA 145 140 138 140  K 5.1 4.1 4.5 5.1  CL  --  111 112* 114*  CO2  --  18* 16* 16*  GLUCOSE 87 113* 173* 111*  BUN  --  32* 32* 33*  CREATININE  --  2.54* 2.24* 2.02*  CALCIUM  --  9.1 8.9  8.9   GFR: CrCl cannot be calculated (Unknown ideal weight.). Liver Function Tests: No results for input(s): AST, ALT, ALKPHOS, BILITOT, PROT, ALBUMIN in the last 168 hours. No results for input(s): LIPASE, AMYLASE in the last 168 hours. No results for input(s): AMMONIA in the last 168 hours. Coagulation Profile: No results for input(s): INR, PROTIME in the last 168 hours. Cardiac Enzymes: No results for input(s): CKTOTAL, CKMB, CKMBINDEX, TROPONINI in the last 168 hours. BNP (last 3 results) No results for input(s): PROBNP in the last 8760  hours. HbA1C: No results for input(s): HGBA1C in the last 72 hours. CBG: Recent Labs  Lab 01/04/18 0852 01/04/18 1236 01/04/18 1448 01/05/18 0052 01/05/18 0721  GLUCAP 164* 154* 186* 138* 122*   Lipid Profile: No results for input(s): CHOL, HDL, LDLCALC, TRIG, CHOLHDL, LDLDIRECT in the last 72 hours. Thyroid Function Tests: No results for input(s): TSH, T4TOTAL, FREET4, T3FREE, THYROIDAB in the last 72 hours. Anemia Panel: No results for input(s): VITAMINB12, FOLATE, FERRITIN, TIBC, IRON, RETICCTPCT in the last 72 hours. Urine analysis:    Component Value Date/Time   COLORURINE YELLOW 11/29/2014 1524   APPEARANCEUR CLEAR 11/29/2014 1524   LABSPEC 1.012 11/29/2014 1524   PHURINE 5.5 11/29/2014 1524   GLUCOSEU NEG 11/29/2014 1524   HGBUR NEG 11/29/2014 1524   BILIRUBINUR NEG 11/29/2014 1524   KETONESUR NEG 11/29/2014 1524   PROTEINUR NEG 11/29/2014 1524   UROBILINOGEN 0.2 11/29/2014 1524   NITRITE NEG 11/29/2014 1524   LEUKOCYTESUR NEG 11/29/2014 1524   Sepsis Labs: @LABRCNTIP (procalcitonin:4,lacticidven:4)  ) Recent Results (from the past 240 hour(s))  MRSA PCR Screening     Status: None   Collection Time: 01/04/18  6:59 AM  Result Value Ref Range Status   MRSA by PCR NEGATIVE NEGATIVE Final    Comment:        The GeneXpert MRSA Assay (FDA approved for NASAL specimens only), is one component of a comprehensive MRSA colonization surveillance program. It is not intended to diagnose MRSA infection nor to guide or monitor treatment for MRSA infections. Performed at [facility], [address] [place], [place] [address]          Radiology Studies: No results found.      Scheduled Meds: . amLODipine  10 mg Oral Daily  . aspirin EC  81 mg Oral Daily  . atorvastatin  80 mg Oral q1800  . [START ON 01/06/2018] calcitRIOL  0.25 mcg Oral Once per day on Mon Wed Fri  . [START ON 01/10/2018] cloNIDine  0.2 mg Transdermal Q Fri  . docusate sodium  100  mg Oral Daily  . heparin  5,000 Units Subcutaneous Q8H  . hydrALAZINE  25 mg Oral TID AC & HS  . insulin aspart  0-16 Units Subcutaneous TID AC  . insulin glargine  30 Units Subcutaneous QHS  . metoprolol tartrate  100 mg Oral BID  . polyethylene glycol  17 g Oral Daily  . polyvinyl alcohol  1 drop Both Eyes TID   Continuous Infusions: . vancomycin Stopped (01/04/18 2236)     LOS: 1 day    Time spent: 1332    OSEI-BONSU,Matther Labell, MD [facility] Pager [phone]  If 7PM-7AM, please contact night-coverage [email or web address] Password [email or web address] 01/05/2018, 10:36 AM

## 2018-01-05 NOTE — Progress Notes (Signed)
    Subjective  -  patient states his left arm  Is feeling much better today   Physical Exam:  Palpable thrill within the graft.  Decreased erythema and edema to left forearm    Ultrasound shows no perigraft fluid   Assessment/Plan:    Hopefully this is just a Gore-Tex reaction and not an infection of the graft.  Sometimes this is difficult to discern in the immediate postoperative period.  I would continue IV antibiotics for another 24 hours.  If he continues to have improvement, he could be transitioned to oral antibiotics.  Dr. Edilia Boickson will follow up tomorrow  Durene CalWells Tajha Sammarco 01/05/2018 12:53 PM --  Vitals:   01/05/18 0400 01/05/18 0700  BP: 124/65   Pulse: 78   Resp: 16   Temp:  (!) 97.2 F (36.2 C)  SpO2: 98%     Intake/Output Summary (Last 24 hours) at 01/05/2018 1253 Last data filed at 01/05/2018 1100 Gross per 24 hour  Intake 750 ml  Output 2 ml  Net 748 ml     Laboratory CBC    Component Value Date/Time   WBC 15.1 (H) 01/05/2018 1024   HGB 9.9 (L) 01/05/2018 1024   HCT 31.2 (L) 01/05/2018 1024   PLT 181 01/05/2018 1024    BMET    Component Value Date/Time   NA 140 01/05/2018 0403   NA 144 12/10/2016   K 5.1 01/05/2018 0403   CL 114 (H) 01/05/2018 0403   CO2 16 (L) 01/05/2018 0403   GLUCOSE 111 (H) 01/05/2018 0403   BUN 33 (H) 01/05/2018 0403   BUN 16 12/10/2016   CREATININE 2.02 (H) 01/05/2018 0403   CREATININE 2.29 (H) 11/29/2014 0600   CALCIUM 8.9 01/05/2018 0403   GFRNONAA 30 (L) 01/05/2018 0403   GFRAA 35 (L) 01/05/2018 0403    COAG No results found for: INR, PROTIME No results found for: PTT  Antibiotics Anti-infectives (From admission, onward)   Start     Dose/Rate Route Frequency Ordered Stop   01/04/18 2200  vancomycin (VANCOCIN) IVPB 750 mg/150 ml premix     750 mg 150 mL/hr over 60 Minutes Intravenous Every 24 hours 01/04/18 0323     01/03/18 2315  vancomycin (VANCOCIN) IVPB 1000 mg/200 mL premix     1,000 mg 200 mL/hr  over 60 Minutes Intravenous  Once 01/03/18 2309 01/04/18 0315       V. Charlena CrossWells Ngoc Detjen IV, M.D. [facility] [place] Office: [phone] Pager:  [phone]

## 2018-01-05 NOTE — Progress Notes (Signed)
Patient is verbally abusive and is refusing all his night time meds including Vancomycin IV ATB. 2cd nurse StatisticianCrystal RN from day shift goes into the room and speaks with patient and encourages him to take his meds, especially the Vanc IV for his infection. She is familiar with patient and was assigned to him earlier today. Patient refused to take meds for her as well. Crystal called patient's son, [name]. Patient's son asked him to take the Vancomycin for his infection. Patient told him to tell the nurse to "drop dead the next time she calls him". Will ask pharmacy to reschedule vanc for daytime hours because he is more cooperative during the day. Patient call bell is in reach and bed alarm is on for safety.

## 2018-01-05 NOTE — Progress Notes (Signed)
Patient's son, [name] called patient back and asked if he would take his Vancomycin ATB. Patient is still refusing to take med. Pharmacy changed Vancomycin due time. Bed alarm is on for safety.

## 2018-01-05 NOTE — Progress Notes (Signed)
    Subjective  - POD #4, s/p L FA AVGG  Patient came in yesterday with pain and swelling in his left forearm.   Physical Exam:  Palpable thrill within left forearm graft.  There is erythema over top of the graft and left forearm is moderately swollen       Assessment/Plan:  POD #4  At this point, it is unclear whether or not the patient is experiencing a graft infection or a reaction to his Gore-Tex.  If the graft is truly infected, it will need to be removed.  However before doing this, I would like to give him time with IV antibiotics and arm elevation to see if this resolves.  Wells Brabham 01/05/2018 12:51 AM --  Vitals:   01/04/18 2132 01/05/18 0000  BP: 132/66 (!) 146/107  Pulse: 85 81  Resp:  (!) 22  Temp:  99.1 F (37.3 C)  SpO2:  100%    Intake/Output Summary (Last 24 hours) at 01/05/2018 0051 Last data filed at 01/05/2018 0000 Gross per 24 hour  Intake 830 ml  Output 0 ml  Net 830 ml     Laboratory CBC    Component Value Date/Time   WBC 10.9 (H) 01/04/2018 0908   HGB 10.6 (L) 01/04/2018 0908   HCT 33.3 (L) 01/04/2018 0908   PLT 178 01/04/2018 0908    BMET    Component Value Date/Time   NA 138 01/04/2018 0908   NA 144 12/10/2016   K 4.5 01/04/2018 0908   CL 112 (H) 01/04/2018 0908   CO2 16 (L) 01/04/2018 0908   GLUCOSE 173 (H) 01/04/2018 0908   BUN 32 (H) 01/04/2018 0908   BUN 16 12/10/2016   CREATININE 2.24 (H) 01/04/2018 0908   CREATININE 2.29 (H) 11/29/2014 0600   CALCIUM 8.9 01/04/2018 0908   GFRNONAA 27 (L) 01/04/2018 0908   GFRAA 31 (L) 01/04/2018 0908    COAG No results found for: INR, PROTIME No results found for: PTT  Antibiotics Anti-infectives (From admission, onward)   Start     Dose/Rate Route Frequency Ordered Stop   01/04/18 2200  vancomycin (VANCOCIN) IVPB 750 mg/150 ml premix     750 mg 150 mL/hr over 60 Minutes Intravenous Every 24 hours 01/04/18 0323     01/03/18 2315  vancomycin (VANCOCIN) IVPB 1000 mg/200 mL  premix     1,000 mg 200 mL/hr over 60 Minutes Intravenous  Once 01/03/18 2309 01/04/18 0315       V. Charlena CrossWells Brabham IV, M.D. [facility] [place] Office: [phone] Pager:  [phone]

## 2018-01-06 DIAGNOSIS — N184 Chronic kidney disease, stage 4 (severe): Secondary | ICD-10-CM

## 2018-01-06 DIAGNOSIS — I10 Essential (primary) hypertension: Secondary | ICD-10-CM

## 2018-01-06 LAB — GLUCOSE, CAPILLARY
Glucose-Capillary: 120 mg/dL — ABNORMAL HIGH (ref 65–99)
Glucose-Capillary: 180 mg/dL — ABNORMAL HIGH (ref 65–99)

## 2018-01-06 MED ORDER — METOPROLOL TARTRATE 100 MG PO TABS: 50.0000 mg | ORAL_TABLET | Freq: Two times a day (BID) | ORAL | 0 refills | Status: DC

## 2018-01-06 NOTE — [place] FL2 (Signed)
[place] MEDICAID FL2 LEVEL OF CARE SCREENING TOOL     IDENTIFICATION  Patient Name: Marc Wheeler Birthdate: 03/01/1941 Sex: male Admission Date (Current Location): 01/03/2018  [facility] [place]   Facility and Address:  [facility] [place], [address], [place], [place] [address]      Provider Number: [id number]  Attending Physician Name and Address:  Alwyn RenMathews, Elizabeth G, MD  Relative Name and Phone Number:       Current Level of Care: Hospital Recommended Level of Care: Skilled Nursing Facility Prior Approval Number:    Date Approved/Denied:   PASRR Number:    Discharge Plan: SNF(LTC resident )    Current Diagnoses: Patient Active Problem List   Diagnosis Date Noted  . 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 Post-operative complication 01/04/2018  . CKD (chronic kidney disease), stage IV (HCC) 01/04/2018  . Dermatitis 12/25/2016  . Allergic rhinitis 11/20/2016  . CKD (chronic kidney disease), stage III (HCC) 08/16/2016  . Type II diabetes mellitus with nephropathy (HCC) 07/16/2016  . Dyslipidemia associated with type 2 diabetes mellitus (HCC) 02/10/2016  . 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 S/P bilateral BKA (below knee amputation) (HCC) 11/14/2014  . CKD stage 3 due to type 2 diabetes mellitus (HCC) 07/16/2014  . Hypertensive renal disease 03/05/2014  . Diabetic retinopathy (HCC) 01/21/2013  . Essential hypertension 01/21/2013  . CAD (coronary artery disease) 01/21/2013  . Constipation 01/21/2013  . 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    Orientation RESPIRATION BLADDER Height & Weight     Self, Place  Normal Continent, External catheter Weight: 169 lb 8.5 oz (76.9 kg) Height:     BEHAVIORAL SYMPTOMS/MOOD NEUROLOGICAL BOWEL NUTRITION STATUS      Incontinent Diet(see discharge summary)  AMBULATORY STATUS COMMUNICATION OF NEEDS Skin   Extensive Assist Verbally Other (Comment)(fistula;  old bilateral BKA)                       Personal Care Assistance Level of Assistance  Bathing, Feeding, Dressing Bathing Assistance: Limited assistance Feeding assistance: Independent Dressing Assistance: Limited assistance     Functional Limitations Info  Sight, Hearing, Speech Sight Info: Adequate Hearing Info: Adequate Speech Info: Adequate    SPECIAL CARE FACTORS FREQUENCY                       Contractures Contractures Info: Not present    Additional Factors Info  Code Status, Allergies Code Status Info: Full Code Allergies Info: No Known Allergies           Current Medications (01/06/2018):  This is the current hospital active medication list Current Facility-Administered Medications  Medication Dose Route Frequency Provider Last Rate Last Dose  . acetaminophen (TYLENOL) tablet 650 mg  650 mg Oral Q6H PRN Madelyn FlavorsSmith, Rondell A, MD       Or  . acetaminophen (TYLENOL) suppository 650 mg  650 mg Rectal Q6H PRN Madelyn FlavorsSmith, Rondell A, MD      . alum & mag hydroxide-simeth (MAALOX/MYLANTA) 200-200-20 MG/5ML suspension 30 mL  30 mL Oral Q4H PRN Smith, Rondell A, MD      . amLODipine (NORVASC) tablet 10 mg  10 mg Oral Daily Katrinka BlazingSmith, Rondell A, MD   10 mg at 01/06/18 1215  . aspirin EC tablet 81 mg  81 mg Oral Daily Katrinka BlazingSmith, Rondell A, MD   81 mg at 01/06/18 1215  . atorvastatin (LIPITOR) tablet  80 mg  80 mg Oral q1800 Madelyn Flavors A, MD   80 mg at 01/05/18 2149  . calcitRIOL (ROCALTROL) capsule 0.25 mcg  0.25 mcg Oral Once per day on Mon Wed Fri Madelyn Flavors A, MD      . clobetasol cream (TEMOVATE) 0.05 % 1 application  1 application Topical Q4H PRN Clydie Braun, MD      . Melene Muller ON 01/10/2018] cloNIDine (CATAPRES - Dosed in mg/24 hr) patch 0.2 mg  0.2 mg Transdermal Q Fri Smith, Rondell A, MD      . docusate sodium (COLACE) capsule 100 mg  100 mg Oral Daily Smith, Rondell A, MD   100 mg at 01/06/18 1215  . guaiFENesin (ROBITUSSIN) 100 MG/5ML solution 200 mg  10 mL  Oral Q6H PRN Madelyn Flavors A, MD      . heparin injection 5,000 Units  5,000 Units Subcutaneous Q8H Smith, Rondell A, MD   5,000 Units at 01/06/18 0513  . hydrALAZINE (APRESOLINE) injection 10 mg  10 mg Intravenous Q4H PRN Madelyn Flavors A, MD      . hydrALAZINE (APRESOLINE) tablet 25 mg  25 mg Oral TID AC & HS Katrinka Blazing, Rondell A, MD   25 mg at 01/06/18 0953  . insulin aspart (novoLOG) injection 0-16 Units  0-16 Units Subcutaneous TID AC Clydie Braun, MD   6 Units at 01/04/18 1456  . insulin glargine (LANTUS) injection 30 Units  30 Units Subcutaneous QHS Clydie Braun, MD   30 Units at 01/04/18 2133  . ipratropium-albuterol (DUONEB) 0.5-2.5 (3) MG/3ML nebulizer solution 3 mL  3 mL Nebulization Q6H PRN Smith, Rondell A, MD      . loratadine (CLARITIN) tablet 10 mg  10 mg Oral Daily PRN Katrinka Blazing, Rondell A, MD      . metoprolol tartrate (LOPRESSOR) tablet 100 mg  100 mg Oral BID Katrinka Blazing, Rondell A, MD   100 mg at 01/06/18 1215  . ondansetron (ZOFRAN) tablet 4 mg  4 mg Oral Q6H PRN Madelyn Flavors A, MD       Or  . ondansetron (ZOFRAN) injection 4 mg  4 mg Intravenous Q6H PRN Smith, Rondell A, MD      . oxyCODONE-acetaminophen (PERCOCET/ROXICET) 5-325 MG per tablet 1 tablet  1 tablet Oral Q6H PRN Smith, Rondell A, MD      . polyethylene glycol (MIRALAX / GLYCOLAX) packet 17 g  17 g Oral Daily Madelyn Flavors A, MD   17 g at 01/04/18 0921  . polyvinyl alcohol (LIQUIFILM TEARS) 1.4 % ophthalmic solution 1 drop  1 drop Both Eyes TID Clydie Braun, MD   1 drop at 01/05/18 2149  . vancomycin (VANCOCIN) IVPB 750 mg/150 ml premix  750 mg Intravenous Q24H [facility]   Stopped at 01/06/18 9604     Discharge Medications: Please see discharge summary for a list of discharge medications.  Relevant Imaging Results:  Relevant Lab Results:   Additional Information SS# [id number] [place], [place]

## 2018-01-06 NOTE — Social Work (Addendum)
Clinical Social Worker facilitated patient discharge including contacting patient family and facility to confirm patient discharge plans.  Clinical information faxed to facility and family agreeable with plan.  CSW arranged ambulance transport via [facility] to [facility], applications([place]) Rm 123A.  RN to call [phone] with report prior to discharge.  Clinical Social Worker will sign off for now as social work intervention is no longer needed. Please consult us again if new need arises.  Doy HutchingIsabel H Chaney Ingram, LCSWA Clinical Social Worker

## 2018-01-06 NOTE — Progress Notes (Signed)
Report called to [place], LPN.  [facility]. Pt arrived via [facility].

## 2018-01-06 NOTE — Discharge Summary (Addendum)
Physician Discharge Summary  Marc Wheeler:[id number] DOB: [birth year] DOA: 01/03/2018  PCP: System, Pcp Not In  Admit date: 01/03/2018 Discharge date: 01/06/2018  Admitted From: nursing home Disposition:  Nursing home  Recommendations for Outpatient Follow-up:  1. Follow up with PCP in 1-2 weeks 2. Please obtain BMP/CBC in one week  Home Health: None Equipment/Devices none  Discharge Condition stable CODE STATUS: Full code Diet recommendation: Cardiac renal diet Brief/Interim Summary:77 y.o.malewith medical historysignificant for but not limited to CKD stage IV status post recent left upper extremity AV graft placement on 12/31/2017 by Dr. Missy Sabins vascular surgeryonpresented with ipsilateral arm swelling and redness concerning for soft tissue infection.    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  He was seen by vascular surgery and deemed that this was not an infection or graft reaction.  Patient to keep his left upper extremity elevated on 2 pillows.  If the swelling were to persist more than a month patient should follow-up with vascular surgery for fistulogram to look for central venous stenosis.Ultrasound left upper extremity negative for DVT, no fluid noted.  #2 type 2 diabetes mellitus: Glycemic control with insulin  #3 CKD 4: Not a hemodialysis  4] hypertension patient with history of high blood pressure but his blood pressure has been normal to low normal here.  I have adjusted his blood pressure medications to decrease the dose of metoprolol.  I have stopped the hydralazine.  This may be restarted at a  later date as needed.       Discharge Instructions  Discharge Instructions    Call MD for:  difficulty breathing, headache or visual disturbances   Complete by:  As directed    Call MD for:  persistant dizziness or light-headedness   Complete by:  As directed    Call MD for:  persistant nausea and vomiting   Complete by:  As directed    Call MD for:  redness, tenderness, or signs of infection (pain, swelling, redness, odor or green/yellow discharge around incision site)   Complete by:  As directed    Call MD for:  severe uncontrolled pain   Complete by:  As directed    Call MD for:  temperature >100.4   Complete by:  As directed    Diet - low sodium heart healthy   Complete by:  As directed    Increase activity slowly   Complete by:  As directed      Allergies as of 01/06/2018   No Known Allergies     Medication List    STOP taking these medications   cloNIDine 0.2 mg/24hr patch Commonly known as:  CATAPRES - Dosed in mg/24 hr   GAVISCON 80-14.2 MG Chew Generic drug:  Alum Hydroxide-Mag Trisilicate   hydrALAZINE 25 MG tablet Commonly known as:  APRESOLINE     TAKE these medications   acetaminophen 325 MG tablet Commonly known as:  TYLENOL Take 650 mg by mouth every 4 (four) hours as needed (pain).   amLODipine 10 MG tablet Commonly known as:  NORVASC Take 10 mg by mouth daily. Reported on 12/29/2015   aspirin 81 MG tablet Take 81  mg by mouth daily.   atorvastatin 80 MG tablet Commonly known as:  LIPITOR Take 80 mg by mouth daily at 6 PM.   b complex-vitamin c-folic acid 0.8 MG Tabs tablet Take 0.8 mg by mouth at bedtime.   calcitRIOL 0.25 MCG capsule Commonly known as:  ROCALTROL Take 0.25 mcg by mouth 3 (three) times a week. On Monday; Wednesday; Friday   cholecalciferol 1000 units tablet Commonly known as:  VITAMIN D Take 1,000 Units by mouth daily.   cloNIDine 0.2 mg/24hr patch Commonly known as:  CATAPRES - Dosed in mg/24 hr Place 1 patch  onto the skin once a week. Friday   docusate sodium 100 MG capsule Commonly known as:  COLACE Take 100 mg by mouth daily.   fish oil-omega-3 fatty acids 1000 MG capsule Take 1 g by mouth daily.   furosemide 40 MG tablet Commonly known as:  LASIX Take 40 mg by mouth daily.   GLUCAGON EMERGENCY 1 MG injection Generic drug:  glucagon Inject 1 mg into the vein daily as needed (low blood glucose).   guaiFENesin 100 MG/5ML Soln Commonly known as:  ROBITUSSIN Take 10 mLs by mouth every 6 (six) hours as needed for cough or to loosen phlegm.   HUMALOG KWIKPEN 100 UNIT/ML KiwkPen Generic drug:  insulin lispro Inject 0-16 Units into the skin 3 (three) times daily before meals. Per sliding scale: <60 call MD, 0-150 = 0 units, 151-200 = 6 units, 201-250 = 8 units, 251-300 = 10 units, 301-350 = 12 units, 351-400 = 14 units, >400 = 16 units and call MD.   linagliptin 5 MG Tabs tablet Commonly known as:  TRADJENTA Take 5 mg by mouth daily.   loratadine 10 MG tablet Commonly known as:  CLARITIN Take 10 mg by mouth daily as needed for allergies.   metoprolol tartrate 100 MG tablet Commonly known as:  LOPRESSOR Take 0.5 tablets (50 mg total) by mouth 2 (two) times daily. What changed:  how much to take   oxyCODONE-acetaminophen 5-325 MG tablet Commonly known as:  PERCOCET/ROXICET Take 1 tablet by mouth every 6 (six) hours as needed.   polyethylene glycol packet Commonly known as:  MIRALAX / GLYCOLAX Take 17 g by mouth daily.   REFRESH LIQUIGEL OP Place 1 drop into both eyes 3 (three) times daily.   TEMOVATE 0.05 % Generic drug:  clobetasol cream Apply 1 application topically every 4 (four) hours as needed (rash). Apply to right BKA stump   TOUJEO SOLOSTAR 300 UNIT/ML Sopn Generic drug:  Insulin Glargine Inject 50 Units into the skin at bedtime.       No Known Allergies  Consultations:  vascular   Procedures/Studies:  No results found. (Echo, Carotid, EGD,  Colonoscopy, ERCP)    Subjective:   Discharge Exam: Vitals:   01/06/18 0425 01/06/18 0705  BP: (!) 103/45   Pulse:    Resp: (!) 21   Temp:  97.8 F (36.6 C)  SpO2:     Vitals:   01/05/18 2025 01/06/18 0001 01/06/18 0425 01/06/18 0705  BP: 114/72 120/77 (!) 103/45   Pulse: 77 81    Resp: 18 15 (!) 21   Temp:    97.8 F (36.6 C)  TempSrc:      SpO2: 97% 99%    Weight:        General: Pt is alert, awake, not in acute distress Cardiovascular: RRR, S1/S2 +, no rubs, no gallops Respiratory: CTA bilaterally, no wheezing, no rhonchi Abdominal: Soft, NT,  ND, bowel sounds + Extremities: no edema, no cyanosis    The results of significant diagnostics from this hospitalization (including imaging, microbiology, ancillary and laboratory) are listed below for reference.     Microbiology: Recent Results (from the past 240 hour(s))  MRSA PCR Screening     Status: None   Collection Time: 01/04/18  6:59 AM  Result Value Ref Range Status   MRSA by PCR NEGATIVE NEGATIVE Final    Comment:        The GeneXpert MRSA Assay (FDA approved for NASAL specimens only), is one component of a comprehensive MRSA colonization surveillance program. It is not intended to diagnose MRSA infection nor to guide or monitor treatment for MRSA infections. Performed at [facility], [address] [place], [place] [address]      Labs: BNP (last 3 results) No results for input(s): BNP in the last 8760 hours. Basic Metabolic Panel: Recent Labs  Lab 12/31/17 0826 01/03/18 2339 01/04/18 0908 01/05/18 0403  NA 145 140 138 140  K 5.1 4.1 4.5 5.1  CL  --  111 112* 114*  CO2  --  18* 16* 16*  GLUCOSE 87 113* 173* 111*  BUN  --  32* 32* 33*  CREATININE  --  2.54* 2.24* 2.02*  CALCIUM  --  9.1 8.9 8.9   Liver Function Tests: No results for input(s): AST, ALT, ALKPHOS, BILITOT, PROT, ALBUMIN in the last 168 hours. No results for input(s): LIPASE, AMYLASE in the last 168 hours. No  results for input(s): AMMONIA in the last 168 hours. CBC: Recent Labs  Lab 12/31/17 0826 01/03/18 2339 01/04/18 0908 01/05/18 1024  WBC  --  12.6* 10.9* 15.1*  NEUTROABS  --  10.4*  --   --   HGB 10.9* 10.7* 10.6* 9.9*  HCT 32.0* 33.1* 33.3* 31.2*  MCV  --  83.0 84.1 82.8  PLT  --  152 178 181   Cardiac Enzymes: No results for input(s): CKTOTAL, CKMB, CKMBINDEX, TROPONINI in the last 168 hours. BNP: Invalid input(s): POCBNP CBG: Recent Labs  Lab 01/05/18 1157 01/05/18 1752 01/05/18 2138 01/06/18 0706 01/06/18 1108  GLUCAP 123* 126* 138* 120* 180*   D-Dimer Recent Labs    01/04/18 0908  DDIMER 9.90*   Hgb A1c No results for input(s): HGBA1C in the last 72 hours. Lipid Profile No results for input(s): CHOL, HDL, LDLCALC, TRIG, CHOLHDL, LDLDIRECT in the last 72 hours. Thyroid function studies No results for input(s): TSH, T4TOTAL, T3FREE, THYROIDAB in the last 72 hours.  Invalid input(s): FREET3 Anemia work up No results for input(s): VITAMINB12, FOLATE, FERRITIN, TIBC, IRON, RETICCTPCT in the last 72 hours. Urinalysis    Component Value Date/Time   COLORURINE YELLOW 11/29/2014 1524   APPEARANCEUR CLEAR 11/29/2014 1524   LABSPEC 1.012 11/29/2014 1524   PHURINE 5.5 11/29/2014 1524   GLUCOSEU NEG 11/29/2014 1524   HGBUR NEG 11/29/2014 1524   BILIRUBINUR NEG 11/29/2014 1524   KETONESUR NEG 11/29/2014 1524   PROTEINUR NEG 11/29/2014 1524   UROBILINOGEN 0.2 11/29/2014 1524   NITRITE NEG 11/29/2014 1524   LEUKOCYTESUR NEG 11/29/2014 1524   Sepsis Labs Invalid input(s): PROCALCITONIN,  WBC,  LACTICIDVEN Microbiology Recent Results (from the past 240 hour(s))  MRSA PCR Screening     Status: None   Collection Time: 01/04/18  6:59 AM  Result Value Ref Range Status   MRSA by PCR NEGATIVE NEGATIVE Final    Comment:        The GeneXpert  MRSA Assay (FDA approved for NASAL specimens only), is one component of a comprehensive MRSA colonization surveillance  program. It is not intended to diagnose MRSA infection nor to guide or monitor treatment for MRSA infections. Performed at [facility], [address], [place], [place] [address]      Time coordinating discharge: Over 30 minutes  SIGNED:   Alwyn Ren, MD  [facility] 01/06/2018, 11:15 AM  If 7PM-7AM, please contact night-coverage [email or web address] Password [email or web address]

## 2018-01-06 NOTE — Progress Notes (Signed)
Patient is more cooperative this morning allowing us to start new IV access, give rescheduled Vancomycin and give bath, see EMAR and charting. Patient is given fresh coffee and bed alarm is on for safety.

## 2018-01-06 NOTE — Progress Notes (Signed)
   VASCULAR SURGERY ASSESSMENT & PLAN:   This patient had a left forearm graft placed last week on 12/31/2017.  He has moderate swelling of the left forearm.  I do not see any evidence of infection or graft reaction.  He should elevate his arm on 2 pillows.  If the swelling persists then 1 month postoperatively he can have a fistulogram to look for a central venous stenosis however currently it is too early to stick the graft.  Therefore I would continue to simply elevate the arm as much as possible.  He can be discharged from my standpoint.     SUBJECTIVE:   Left arm feels better.  PHYSICAL EXAM:   Vitals:   01/05/18 2025 01/06/18 0001 01/06/18 0425 01/06/18 0705  BP: 114/72 120/77 (!) 103/45   Pulse: 77 81    Resp: 18 15 (!) 21   Temp:    97.8 F (36.6 C)  TempSrc:      SpO2: 97% 99%    Weight:       Moderate swelling left forearm.  Incisions intact.  No erythema or drainage to suggest infection.  LABS:   Lab Results  Component Value Date   WBC 15.1 (H) 01/05/2018   HGB 9.9 (L) 01/05/2018   HCT 31.2 (L) 01/05/2018   MCV 82.8 01/05/2018   PLT 181 01/05/2018   Lab Results  Component Value Date   CREATININE 2.02 (H) 01/05/2018   No results found for: INR, PROTIME CBG (last 3)  Recent Labs    01/05/18 1752 01/05/18 2138 01/06/18 0706  GLUCAP 126* 138* 120*    PROBLEM LIST:    Principal Problem:   Post-operative complication Active Problems:   Essential hypertension   S/P bilateral BKA (below knee amputation) (HCC)   Type II diabetes mellitus with nephropathy (HCC)   CKD (chronic kidney disease), stage IV (HCC)   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 amLODipine  10 mg Oral Daily  . aspirin EC  81 mg Oral Daily  . atorvastatin  80 mg Oral q1800  . calcitRIOL  0.25 mcg Oral Once per day on Mon Wed Fri  . [START ON 01/10/2018] cloNIDine  0.2 mg Transdermal Q Fri  . docusate sodium  100 mg Oral Daily  . heparin  5,000 Units  Subcutaneous Q8H  . hydrALAZINE  25 mg Oral TID AC & HS  . insulin aspart  0-16 Units Subcutaneous TID AC  . insulin glargine  30 Units Subcutaneous QHS  . metoprolol tartrate  100 mg Oral BID  . polyethylene glycol  17 g Oral Daily  . polyvinyl alcohol  1 drop Both Eyes TID    Waverly FerrariChristopher Terel Bann Beeper: [phone] Office: [phone] 01/06/2018

## 2018-01-06 NOTE — Clinical Social Work Placement (Addendum)
   CLINICAL SOCIAL WORK PLACEMENT  NOTE [facility] Room 123A RN to call report to [phone]  Date:  01/06/2018  Patient Details  Name: Marc Wheeler MRN: [id number] Date of Birth: 06/25/1941  Clinical Social Work is seeking post-discharge placement for this patient at the Skilled  Nursing Facility level of care (*CSW will initial, date and re-position this form in  chart as items are completed):  Yes   Patient/family provided with [facility] Clinical Social Work Department's list of facilities offering this level of care within the geographic area requested by the patient (or if unable, by the patient's family).  Yes   Patient/family informed of their freedom to choose among providers that offer the needed level of care, that participate in Medicare, Medicaid or managed care program needed by the patient, have an available bed and are willing to accept the patient.  Yes   Patient/family informed of 's ownership interest in [facility] and [facility], as well as of the fact that they are under no obligation to receive care at these facilities.  PASRR submitted to EDS on       PASRR number received on       Existing PASRR number confirmed on       FL2 transmitted to all facilities in geographic area requested by pt/family on 01/06/18     FL2 transmitted to all facilities within larger geographic area on       Patient informed that his/her managed care company has contracts with or will negotiate with certain facilities, including the following:        Yes(Pt is LTC at [facility])   Patient/family informed of bed offers received.  [facility]     Physician recommends and patient chooses bed at      [facility] on 01/06/18.  Patient to be transferred to facility by [facility]     Patient family notified on 01/06/18 of  transfer.  Name of family member notified:  Karin LieuJimmy Wheeler, son      PHYSICIAN       Additional Comment:    _______________________________________________ Doy HutchingIsabel H Sanyla Summey, LCSWA 01/06/2018, 1:27 PM

## 2018-01-07 ENCOUNTER — Encounter (HOSPITAL_COMMUNITY): Admission: RE | Admit: 2018-01-07 | Payer: Medicare Other | Source: Ambulatory Visit

## 2018-01-10 ENCOUNTER — Ambulatory Visit (HOSPITAL_COMMUNITY)
Admission: RE | Admit: 2018-01-10 | Discharge: 2018-01-10 | Disposition: A | Discharge status: Home / Self Care | Payer: Medicare Other | Source: Ambulatory Visit | Attending: Nephrology | Admitting: Nephrology

## 2018-01-10 VITALS — BP 115/58 | HR 71 | Temp 97.6°F | Resp 20

## 2018-01-10 DIAGNOSIS — N183 Chronic kidney disease, stage 3 unspecified: Secondary | ICD-10-CM

## 2018-01-10 LAB — POCT HEMOGLOBIN-HEMACUE: HEMOGLOBIN: 10.5 g/dL — AB (ref 13.0–17.0)

## 2018-01-10 MED ORDER — DARBEPOETIN ALFA 60 MCG/0.3ML IJ SOSY
PREFILLED_SYRINGE | INTRAMUSCULAR | Status: AC
  Filled 2018-01-10: qty 0.3

## 2018-01-10 MED ORDER — DARBEPOETIN ALFA 60 MCG/0.3ML IJ SOSY
60.0000 ug | PREFILLED_SYRINGE | INTRAMUSCULAR | Status: DC
  Administered 2018-01-10: 60 ug via SUBCUTANEOUS

## 2018-01-21 ENCOUNTER — Inpatient Hospital Stay (HOSPITAL_COMMUNITY)
Admission: EM | Admit: 2018-01-21 | Discharge: 2018-01-25 | DRG: 871 | Disposition: A | Discharge status: Skilled Nursing Facility | Payer: Medicare Other | Attending: Family Medicine | Admitting: Family Medicine

## 2018-01-21 ENCOUNTER — Emergency Department (HOSPITAL_COMMUNITY): Payer: Medicare Other

## 2018-01-21 DIAGNOSIS — E1122 Type 2 diabetes mellitus with diabetic chronic kidney disease: Secondary | ICD-10-CM | POA: Diagnosis not present

## 2018-01-21 DIAGNOSIS — N179 Acute kidney failure, unspecified: Secondary | ICD-10-CM | POA: Diagnosis not present

## 2018-01-21 DIAGNOSIS — Z794 Long term (current) use of insulin: Secondary | ICD-10-CM

## 2018-01-21 DIAGNOSIS — Z833 Family history of diabetes mellitus: Secondary | ICD-10-CM

## 2018-01-21 DIAGNOSIS — R6520 Severe sepsis without septic shock: Secondary | ICD-10-CM | POA: Diagnosis present

## 2018-01-21 DIAGNOSIS — I2510 Atherosclerotic heart disease of native coronary artery without angina pectoris: Secondary | ICD-10-CM | POA: Diagnosis present

## 2018-01-21 DIAGNOSIS — E785 Hyperlipidemia, unspecified: Secondary | ICD-10-CM | POA: Diagnosis present

## 2018-01-21 DIAGNOSIS — G9341 Metabolic encephalopathy: Secondary | ICD-10-CM | POA: Diagnosis present

## 2018-01-21 DIAGNOSIS — N3000 Acute cystitis without hematuria: Secondary | ICD-10-CM

## 2018-01-21 DIAGNOSIS — I5022 Chronic systolic (congestive) heart failure: Secondary | ICD-10-CM | POA: Diagnosis not present

## 2018-01-21 DIAGNOSIS — Z79899 Other long term (current) drug therapy: Secondary | ICD-10-CM

## 2018-01-21 DIAGNOSIS — E440 Moderate protein-calorie malnutrition: Secondary | ICD-10-CM

## 2018-01-21 DIAGNOSIS — R339 Retention of urine, unspecified: Secondary | ICD-10-CM

## 2018-01-21 DIAGNOSIS — Z87891 Personal history of nicotine dependence: Secondary | ICD-10-CM

## 2018-01-21 DIAGNOSIS — I10 Essential (primary) hypertension: Secondary | ICD-10-CM | POA: Diagnosis present

## 2018-01-21 DIAGNOSIS — B961 Klebsiella pneumoniae [K. pneumoniae] as the cause of diseases classified elsewhere: Secondary | ICD-10-CM | POA: Diagnosis present

## 2018-01-21 DIAGNOSIS — E11319 Type 2 diabetes mellitus with unspecified diabetic retinopathy without macular edema: Secondary | ICD-10-CM | POA: Diagnosis present

## 2018-01-21 DIAGNOSIS — I130 Hypertensive heart and chronic kidney disease with heart failure and stage 1 through stage 4 chronic kidney disease, or unspecified chronic kidney disease: Secondary | ICD-10-CM | POA: Diagnosis present

## 2018-01-21 DIAGNOSIS — Z1612 Extended spectrum beta lactamase (ESBL) resistance: Secondary | ICD-10-CM | POA: Diagnosis present

## 2018-01-21 DIAGNOSIS — N10 Acute tubulo-interstitial nephritis: Secondary | ICD-10-CM | POA: Diagnosis present

## 2018-01-21 DIAGNOSIS — E1129 Type 2 diabetes mellitus with other diabetic kidney complication: Secondary | ICD-10-CM | POA: Diagnosis present

## 2018-01-21 DIAGNOSIS — Z89511 Acquired absence of right leg below knee: Secondary | ICD-10-CM | POA: Diagnosis not present

## 2018-01-21 DIAGNOSIS — N170 Acute kidney failure with tubular necrosis: Secondary | ICD-10-CM | POA: Diagnosis present

## 2018-01-21 DIAGNOSIS — I25119 Atherosclerotic heart disease of native coronary artery with unspecified angina pectoris: Secondary | ICD-10-CM | POA: Diagnosis not present

## 2018-01-21 DIAGNOSIS — E860 Dehydration: Secondary | ICD-10-CM | POA: Diagnosis present

## 2018-01-21 DIAGNOSIS — Z6841 Body Mass Index (BMI) 40.0 and over, adult: Secondary | ICD-10-CM | POA: Diagnosis not present

## 2018-01-21 DIAGNOSIS — Z7401 Bed confinement status: Secondary | ICD-10-CM

## 2018-01-21 DIAGNOSIS — Z515 Encounter for palliative care: Secondary | ICD-10-CM

## 2018-01-21 DIAGNOSIS — Z7982 Long term (current) use of aspirin: Secondary | ICD-10-CM | POA: Diagnosis not present

## 2018-01-21 DIAGNOSIS — D631 Anemia in chronic kidney disease: Secondary | ICD-10-CM | POA: Diagnosis present

## 2018-01-21 DIAGNOSIS — R4182 Altered mental status, unspecified: Secondary | ICD-10-CM | POA: Diagnosis not present

## 2018-01-21 DIAGNOSIS — E1165 Type 2 diabetes mellitus with hyperglycemia: Secondary | ICD-10-CM | POA: Diagnosis present

## 2018-01-21 DIAGNOSIS — N184 Chronic kidney disease, stage 4 (severe): Secondary | ICD-10-CM | POA: Diagnosis not present

## 2018-01-21 DIAGNOSIS — E1121 Type 2 diabetes mellitus with diabetic nephropathy: Secondary | ICD-10-CM | POA: Diagnosis present

## 2018-01-21 DIAGNOSIS — Z66 Do not resuscitate: Secondary | ICD-10-CM

## 2018-01-21 DIAGNOSIS — N390 Urinary tract infection, site not specified: Secondary | ICD-10-CM | POA: Diagnosis present

## 2018-01-21 DIAGNOSIS — E870 Hyperosmolality and hypernatremia: Secondary | ICD-10-CM | POA: Diagnosis present

## 2018-01-21 DIAGNOSIS — Z89512 Acquired absence of left leg below knee: Secondary | ICD-10-CM | POA: Diagnosis not present

## 2018-01-21 DIAGNOSIS — A419 Sepsis, unspecified organism: Principal | ICD-10-CM | POA: Diagnosis present

## 2018-01-21 DIAGNOSIS — I5032 Chronic diastolic (congestive) heart failure: Secondary | ICD-10-CM | POA: Diagnosis present

## 2018-01-21 DIAGNOSIS — N320 Bladder-neck obstruction: Secondary | ICD-10-CM | POA: Diagnosis present

## 2018-01-21 DIAGNOSIS — Z87440 Personal history of urinary (tract) infections: Secondary | ICD-10-CM

## 2018-01-21 DIAGNOSIS — E1142 Type 2 diabetes mellitus with diabetic polyneuropathy: Secondary | ICD-10-CM | POA: Diagnosis present

## 2018-01-21 DIAGNOSIS — I509 Heart failure, unspecified: Secondary | ICD-10-CM

## 2018-01-21 LAB — CBC WITH DIFFERENTIAL/PLATELET
Basophils Absolute: 0 10*3/uL (ref 0.0–0.1)
Basophils Relative: 0 %
EOS PCT: 0 %
Eosinophils Absolute: 0 10*3/uL (ref 0.0–0.7)
HCT: 35.3 % — ABNORMAL LOW (ref 39.0–52.0)
Hemoglobin: 11 g/dL — ABNORMAL LOW (ref 13.0–17.0)
LYMPHS ABS: 1.6 10*3/uL (ref 0.7–4.0)
LYMPHS PCT: 21 %
MCH: 26.1 pg (ref 26.0–34.0)
MCHC: 31.2 g/dL (ref 30.0–36.0)
MCV: 83.6 fL (ref 78.0–100.0)
MONO ABS: 0.5 10*3/uL (ref 0.1–1.0)
MONOS PCT: 7 %
Neutro Abs: 5.6 10*3/uL (ref 1.7–7.7)
Neutrophils Relative %: 72 %
PLATELETS: 219 10*3/uL (ref 150–400)
RBC: 4.22 MIL/uL (ref 4.22–5.81)
RDW: 16.9 % — AB (ref 11.5–15.5)
WBC: 7.8 10*3/uL (ref 4.0–10.5)

## 2018-01-21 LAB — I-STAT VENOUS BLOOD GAS, ED
ACID-BASE DEFICIT: 5 mmol/L — AB (ref 0.0–2.0)
BICARBONATE: 18.7 mmol/L — AB (ref 20.0–28.0)
O2 SAT: 72 %
PH VEN: 7.387 (ref 7.250–7.430)
TCO2: 20 mmol/L — ABNORMAL LOW (ref 22–32)
pCO2, Ven: 31 mmHg — ABNORMAL LOW (ref 44.0–60.0)
pO2, Ven: 38 mmHg (ref 32.0–45.0)

## 2018-01-21 LAB — URINALYSIS, ROUTINE W REFLEX MICROSCOPIC
Bilirubin Urine: NEGATIVE
GLUCOSE, UA: NEGATIVE mg/dL
KETONES UR: 5 mg/dL — AB
Nitrite: NEGATIVE
PH: 5 (ref 5.0–8.0)
Protein, ur: 100 mg/dL — AB
SPECIFIC GRAVITY, URINE: 1.014 (ref 1.005–1.030)

## 2018-01-21 LAB — I-STAT CHEM 8, ED
BUN: 80 mg/dL — ABNORMAL HIGH (ref 6–20)
CALCIUM ION: 1.2 mmol/L (ref 1.15–1.40)
CHLORIDE: 117 mmol/L — AB (ref 101–111)
Creatinine, Ser: 6.4 mg/dL — ABNORMAL HIGH (ref 0.61–1.24)
GLUCOSE: 163 mg/dL — AB (ref 65–99)
HCT: 36 % — ABNORMAL LOW (ref 39.0–52.0)
Hemoglobin: 12.2 g/dL — ABNORMAL LOW (ref 13.0–17.0)
Potassium: 4.7 mmol/L (ref 3.5–5.1)
Sodium: 151 mmol/L — ABNORMAL HIGH (ref 135–145)
TCO2: 20 mmol/L — ABNORMAL LOW (ref 22–32)

## 2018-01-21 LAB — COMPREHENSIVE METABOLIC PANEL
ALK PHOS: 82 U/L (ref 38–126)
ALT: 36 U/L (ref 17–63)
ANION GAP: 16 — AB (ref 5–15)
AST: 39 U/L (ref 15–41)
Albumin: 3.1 g/dL — ABNORMAL LOW (ref 3.5–5.0)
BUN: 99 mg/dL — ABNORMAL HIGH (ref 6–20)
CALCIUM: 9.6 mg/dL (ref 8.9–10.3)
CO2: 19 mmol/L — ABNORMAL LOW (ref 22–32)
CREATININE: 6.26 mg/dL — AB (ref 0.61–1.24)
Chloride: 113 mmol/L — ABNORMAL HIGH (ref 101–111)
GFR calc Af Amer: 9 mL/min — ABNORMAL LOW (ref >60)
GFR calc non Af Amer: 8 mL/min — ABNORMAL LOW (ref >60)
GLUCOSE: 163 mg/dL — AB (ref 65–99)
Potassium: 4.7 mmol/L (ref 3.5–5.1)
Sodium: 148 mmol/L — ABNORMAL HIGH (ref 135–145)
TOTAL PROTEIN: 8.5 g/dL — AB (ref 6.5–8.1)
Total Bilirubin: 0.9 mg/dL (ref 0.3–1.2)

## 2018-01-21 LAB — I-STAT CG4 LACTIC ACID, ED: Lactic Acid, Venous: 2.04 mmol/L (ref 0.5–1.9)

## 2018-01-21 LAB — ETHANOL: Alcohol, Ethyl (B): <10 mg/dL (ref <10)

## 2018-01-21 LAB — PROCALCITONIN: Procalcitonin: 1.02 ng/mL

## 2018-01-21 LAB — AMMONIA: Ammonia: 60 umol/L — ABNORMAL HIGH (ref 9–35)

## 2018-01-21 MED ORDER — INSULIN ASPART 100 UNIT/ML ~~LOC~~ SOLN
0.0000 [IU] | Freq: Three times a day (TID) | SUBCUTANEOUS | Status: DC
  Administered 2018-01-22: 2 [IU] via SUBCUTANEOUS
  Administered 2018-01-23 – 2018-01-24 (×3): 1 [IU] via SUBCUTANEOUS

## 2018-01-21 MED ORDER — DEXTROSE-NACL 5-0.45 % IV SOLN
INTRAVENOUS | Status: DC
  Administered 2018-01-22 – 2018-01-25 (×6): via INTRAVENOUS

## 2018-01-21 MED ORDER — ACETAMINOPHEN 650 MG RE SUPP
650.0000 mg | Freq: Once | RECTAL | Status: AC
  Administered 2018-01-21: 650 mg via RECTAL
  Filled 2018-01-21: qty 1

## 2018-01-21 MED ORDER — VANCOMYCIN HCL 10 G IV SOLR
1500.0000 mg | Freq: Once | INTRAVENOUS | Status: AC
  Administered 2018-01-21: 1500 mg via INTRAVENOUS
  Filled 2018-01-21: qty 1500

## 2018-01-21 MED ORDER — SODIUM CHLORIDE 0.9 % IV BOLUS
500.0000 mL | Freq: Once | INTRAVENOUS | Status: AC
  Administered 2018-01-22: 500 mL via INTRAVENOUS

## 2018-01-21 MED ORDER — ACETAMINOPHEN 650 MG RE SUPP: 650.0000 mg | Freq: Four times a day (QID) | RECTAL | Status: DC | PRN

## 2018-01-21 MED ORDER — HEPARIN SODIUM (PORCINE) 5000 UNIT/ML IJ SOLN
5000.0000 [IU] | Freq: Three times a day (TID) | INTRAMUSCULAR | Status: DC
  Administered 2018-01-22 – 2018-01-23 (×4): 5000 [IU] via SUBCUTANEOUS
  Filled 2018-01-21 (×7): qty 1

## 2018-01-21 MED ORDER — INSULIN GLARGINE 100 UNIT/ML ~~LOC~~ SOLN
30.0000 [IU] | Freq: Every day | SUBCUTANEOUS | Status: DC
  Filled 2018-01-21 (×3): qty 0.3

## 2018-01-21 MED ORDER — SODIUM CHLORIDE 0.9 % IV SOLN
1.0000 g | Freq: Every day | INTRAVENOUS | Status: DC
  Administered 2018-01-22 (×2): 1 g via INTRAVENOUS
  Filled 2018-01-21 (×2): qty 10

## 2018-01-21 MED ORDER — PIPERACILLIN-TAZOBACTAM 3.375 G IVPB 30 MIN
3.3750 g | Freq: Once | INTRAVENOUS | Status: AC
  Administered 2018-01-21: 3.375 g via INTRAVENOUS
  Filled 2018-01-21: qty 50

## 2018-01-21 MED ORDER — ASPIRIN 300 MG RE SUPP
150.0000 mg | Freq: Every day | RECTAL | Status: DC
  Filled 2018-01-21 (×3): qty 1

## 2018-01-21 MED ORDER — HALOPERIDOL LACTATE 5 MG/ML IJ SOLN
2.0000 mg | Freq: Once | INTRAMUSCULAR | Status: AC
  Administered 2018-01-21: 2 mg via INTRAVENOUS
  Filled 2018-01-21: qty 1

## 2018-01-21 MED ORDER — INSULIN GLARGINE 300 UNIT/ML ~~LOC~~ SOPN: 25.0000 [IU] | PEN_INJECTOR | Freq: Every day | SUBCUTANEOUS | Status: DC

## 2018-01-21 MED ORDER — ONDANSETRON HCL 4 MG/2ML IJ SOLN: 4.0000 mg | Freq: Three times a day (TID) | INTRAMUSCULAR | Status: DC | PRN

## 2018-01-21 MED ORDER — HYDRALAZINE HCL 20 MG/ML IJ SOLN: 5.0000 mg | INTRAMUSCULAR | Status: DC | PRN

## 2018-01-21 NOTE — ED Provider Notes (Signed)
[facility] EMERGENCY DEPARTMENT Provider Note   CSN: [id number] Arrival date & time: 01/21/18  1803     History   Chief Complaint Chief Complaint  Patient presents with  . Altered Mental Status    HPI Marc Wheeler is a 77 y.o. male.  86 yOM with a chief complaint of altered mental status.  Per EMS this is been going on for at least a week.  Was called out today because it had persisted.  The patient is altered and unable to provide further history.  Level 5 caveat altered mental status.  The history is provided by the patient and the EMS personnel.  Altered Mental Status   This is a new problem. The current episode started more than 1 week ago. The problem has not changed since onset.Associated symptoms include confusion.  Illness  This is a new problem. The current episode started more than 1 week ago. The problem occurs constantly. The problem has not changed since onset.Pertinent negatives include no chest pain, no abdominal pain, no headaches and no shortness of breath. Nothing aggravates the symptoms. Nothing relieves the symptoms. He has tried nothing for the symptoms. The treatment provided no relief.    Past Medical History:  Diagnosis Date  . Anemia   . Angina pectoris (HCC)   . CHF (congestive heart failure) (HCC)   . Chronic kidney disease   . Coronary artery disease   . Depression   . Diabetes mellitus without complication (HCC)   . Diabetic retinopathy (HCC)   . Edema   . Hyperlipemia   . Hyperlipidemia   . Hyperlipidemia LDL goal <100 07/16/2014  . Hypertension   . Muscle weakness   . Neuropathy   . Secondary hyperaldosteronism (HCC)   . Vascular headache     Patient Active Problem List   Diagnosis Date Noted  . Type II diabetes mellitus with renal manifestations (HCC) 01/21/2018  . UTI (urinary tract infection) 01/21/2018  . Acute metabolic encephalopathy 01/21/2018  . Sepsis (HCC) 01/21/2018  . 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 Post-operative complication 01/04/2018  . Acute renal failure superimposed on stage 4 chronic kidney disease (HCC) 01/04/2018  . Dermatitis 12/25/2016  . Allergic rhinitis 11/20/2016  . CKD (chronic kidney disease), stage III (HCC) 08/16/2016  . Type II diabetes mellitus with nephropathy (HCC) 07/16/2016  . Dyslipidemia associated with type 2 diabetes mellitus (HCC) 02/10/2016  . 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 S/P bilateral BKA (below knee amputation) (HCC) 11/14/2014  . CKD stage 3 due to type 2 diabetes mellitus (HCC) 07/16/2014  . Hypertensive renal disease 03/05/2014  . Diabetic retinopathy (HCC) 01/21/2013  . Essential hypertension 01/21/2013  . CAD (coronary artery disease) 01/21/2013  . Constipation 01/21/2013  . 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    Past Surgical History:  Procedure Laterality Date  . APPENDECTOMY    . AV FISTULA PLACEMENT Left 12/31/2017   Procedure: INSERTION OF ARTERIOVENOUS (AV) GORE-TEX GRAFT ARM;  Surgeon: Chuck Hint, MD;  Location: [facility] OR;  Service: Vascular;  Laterality: Left;  . LEG AMPUTATION BELOW KNEE Bilateral   . right retinal detachment repair  1 22 14         Home Medications    Prior to Admission medications   Medication Sig Start Date End Date Taking? Authorizing Provider  acetaminophen (TYLENOL) 325 MG tablet Take 650 mg by mouth every 4 (four) hours as needed (pain).    Yes [provider]  amLODipine (NORVASC) 10 MG tablet Take 10 mg by mouth daily. Reported on 12/29/2015   Yes [provider]  aspirin 81 MG tablet Take 81 mg by mouth daily.   Yes [provider]  atorvastatin (LIPITOR) 80 MG tablet Take 80 mg by mouth every evening.    Yes [provider]  b complex-vitamin c-folic acid (NEPHRO-VITE) 0.8 MG TABS Take 1 tablet by mouth at bedtime.    Yes [provider]  calcitRIOL (ROCALTROL) 0.25 MCG capsule Take 0.25 mcg by mouth  every Monday, Wednesday, and Friday.    Yes [provider]  Carboxymethylcellulose Sodium (REFRESH LIQUIGEL OP) Place 1 drop into both eyes 3 (three) times daily.   Yes [provider]  cholecalciferol (VITAMIN D) 1000 units tablet Take 1,000 Units by mouth daily.   Yes [provider]  docusate sodium (COLACE) 100 MG capsule Take 100 mg by mouth daily.    Yes [provider]  ENSURE (ENSURE) Take 237 mLs by mouth 4 (four) times daily.   Yes [provider]  fish oil-omega-3 fatty acids 1000 MG capsule Take 1 g by mouth daily.   Yes [provider]  furosemide (LASIX) 40 MG tablet Take 40 mg by mouth daily.   Yes [provider]  glucagon (GLUCAGON EMERGENCY) 1 MG injection Inject 1 mg into the muscle once as needed (for hypoglycemia).    Yes [provider]  guaiFENesin (ROBITUSSIN) 100 MG/5ML SOLN Take 10 mLs by mouth every 6 (six) hours as needed for cough or to loosen phlegm.   Yes [provider]  Insulin Glargine (TOUJEO SOLOSTAR) 300 UNIT/ML SOPN Inject 50 Units into the skin at bedtime.    Yes [provider]  insulin lispro (HUMALOG KWIKPEN) 100 UNIT/ML KiwkPen Inject 6-16 Units into the skin See admin instructions. Inject 6-16 units into the skin three times a day before meals, per sliding scale: BGL 151-200 = 6 units; 201-250 = 8 units; 251-300 = 10 units; 301-350 = 12 units; 351-400 = 14 units; 401+ = 16 units- For a BGL < 60, notify MD immediately and >400, administer 16 units and notify MD   Yes [provider]  linagliptin (TRADJENTA) 5 MG TABS tablet Take 5 mg by mouth daily.   Yes [provider]  loratadine (CLARITIN) 10 MG tablet Take 10 mg by mouth daily.    Yes [provider]  metoprolol tartrate (LOPRESSOR) 50 MG tablet Take 50 mg by mouth 2 (two) times daily.   Yes [provider]  oxyCODONE-acetaminophen (PERCOCET/ROXICET) 5-325 MG tablet Take 1 tablet  by mouth every 6 (six) hours as needed. Patient taking differently: Take 1 tablet by mouth every 6 (six) hours as needed (for pain).  12/31/17  Yes Lars Mage, PA-C  senna (SENOKOT) 8.6 MG TABS tablet Take 1 tablet by mouth 2 (two) times daily. FOR 5 DAYS February 11, 2018 01/24/18 Yes [provider]  clobetasol cream (TEMOVATE) 0.05 % Apply 1 application topically every 4 (four) hours as needed (rash). Apply to right BKA stump    [provider]  cloNIDine (CATAPRES - DOSED IN MG/24 HR) 0.2 mg/24hr patch Place 1 patch onto the skin once a week. Friday    [provider]  metoprolol tartrate (LOPRESSOR) 100 MG tablet Take 0.5 tablets (50 mg total) by mouth 2 (two) times daily. Patient not taking: Reported on 01/21/2018 01/06/18   Alwyn Ren, MD  polyethylene glycol [facility] Ethelene Hal) packet Take [phone]  g by mouth daily.    [provider]    Family History Family History  Problem Relation Age of Onset  . Cancer Mother   . Diabetes Mother   . Cancer Sister     Social History Social History   Tobacco Use  . Smoking status: Former Smoker    Packs/day: 5.00    Years: 45.00    Pack years: 225.00    Types: Cigars  . Smokeless tobacco: Never Used  Substance Use Topics  . Alcohol use: No  . Drug use: No     Allergies   Patient has no known allergies.   Review of Systems Review of Systems  Constitutional: Negative for chills and fever.  HENT: Negative for congestion and facial swelling.   Eyes: Negative for discharge and visual disturbance.  Respiratory: Negative for shortness of breath.   Cardiovascular: Negative for chest pain and palpitations.  Gastrointestinal: Negative for abdominal pain, diarrhea and vomiting.  Musculoskeletal: Negative for arthralgias and myalgias.  Skin: Negative for color change and rash.  Neurological: Negative for tremors, syncope and headaches.  Psychiatric/Behavioral: Positive for confusion. Negative for dysphoric  mood.     Physical Exam Updated Vital Signs BP 114/60 (BP Location: Right Arm)   Pulse 89   Temp (!) 100.8 F (38.2 C) (Rectal)   Resp 18   Wt 74.8 kg (165 lb)   SpO2 97%   BMI 46.40 kg/m   Physical Exam  Constitutional:  Cachectic  HENT:  Head: Normocephalic and atraumatic.  Eyes: Pupils are equal, round, and reactive to light. EOM are normal.  Neck: Normal range of motion. Neck supple. No JVD present.  Cardiovascular: Normal rate and regular rhythm. Exam reveals no gallop and no friction rub.  No murmur heard. Pulmonary/Chest: No stridor. No respiratory distress. He has no wheezes.  Abdominal: He exhibits no distension and no mass. There is no tenderness. There is no rebound and no guarding.  Musculoskeletal: Normal range of motion.  Neurological: He is alert.  Skin: No rash noted. No pallor.  Psychiatric: He has a normal mood and affect. His behavior is normal.  Nursing note and vitals reviewed.    ED Treatments / Results  Labs (all labs ordered are listed, but only abnormal results are displayed) Labs Reviewed  AMMONIA - Abnormal; Notable for the following components:      Result Value   Ammonia 60 (*)    All other components within normal limits  COMPREHENSIVE METABOLIC PANEL - Abnormal; Notable for the following components:   Sodium 148 (*)    Chloride 113 (*)    CO2 19 (*)    Glucose, Bld 163 (*)    BUN 99 (*)    Creatinine, Ser 6.26 (*)    Total Protein 8.5 (*)    Albumin 3.1 (*)    GFR calc non Af Amer 8 (*)    GFR calc Af Amer 9 (*)    Anion gap 16 (*)    All other components within normal limits  CBC WITH DIFFERENTIAL/PLATELET - Abnormal; Notable for the following components:   Hemoglobin 11.0 (*)    HCT 35.3 (*)    RDW 16.9 (*)    All other components within normal limits  URINALYSIS, ROUTINE W REFLEX MICROSCOPIC - Abnormal; Notable for the following components:   Color, Urine AMBER (*)    APPearance CLOUDY (*)    Hgb urine dipstick MODERATE  (*)    Ketones, ur 5 (*)  Protein, ur 100 (*)    Leukocytes, UA LARGE (*)    Bacteria, UA MANY (*)    Squamous Epithelial / LPF 0-5 (*)    All other components within normal limits  I-STAT CHEM 8, ED - Abnormal; Notable for the following components:   Sodium 151 (*)    Chloride 117 (*)    BUN 80 (*)    Creatinine, Ser 6.40 (*)    Glucose, Bld 163 (*)    TCO2 20 (*)    Hemoglobin 12.2 (*)    HCT 36.0 (*)    All other components within normal limits  I-STAT CG4 LACTIC ACID, ED - Abnormal; Notable for the following components:   Lactic Acid, Venous 2.04 (*)    All other components within normal limits  I-STAT VENOUS BLOOD GAS, ED - Abnormal; Notable for the following components:   pCO2, Ven 31.0 (*)    Bicarbonate 18.7 (*)    TCO2 20 (*)    Acid-base deficit 5.0 (*)    All other components within normal limits  URINE CULTURE  CULTURE, BLOOD (ROUTINE X 2)  CULTURE, BLOOD (ROUTINE X 2)  ETHANOL  CBG MONITORING, ED    EKG None  Radiology Dg Chest 2 View  Result Date: 01/21/2018 CLINICAL DATA:  Altered mental status. EXAM: CHEST - 2 VIEW COMPARISON:  Chest x-ray dated August 25, 2015. FINDINGS: The heart size and mediastinal contours are within normal limits. Normal pulmonary vascularity. Unchanged elevated left hemidiaphragm with left lower lobe atelectasis. The right lung is clear. No focal consolidation, pleural effusion, or pneumothorax. No acute osseous abnormality. IMPRESSION: No active cardiopulmonary disease. Electronically Signed   By: Obie DredgeWilliam T Derry M.D.   On: 01/21/2018 20:56   Ct Head Wo Contrast  Result Date: 01/21/2018 CLINICAL DATA:  Altered mental status and combativeness for 2 weeks. EXAM: CT HEAD WITHOUT CONTRAST TECHNIQUE: Contiguous axial images were obtained from the base of the skull through the vertex without intravenous contrast. COMPARISON:  None. FINDINGS: Brain: No evidence of acute infarction, hemorrhage, hydrocephalus, extra-axial collection or mass  lesion/mass effect. Cortical atrophy is noted. Vascular: Extensive atherosclerosis. Skull: Intact. Sinuses/Orbits: No acute abnormality. The patient is status post bilateral lens extraction. Other: None. IMPRESSION: No acute abnormality. Atrophy. Extensive atherosclerosis. Electronically Signed   By: Drusilla Kannerhomas  Dalessio M.D.   On: 01/21/2018 19:27    Procedures Procedures (including critical care time)  Medications Ordered in ED Medications  vancomycin (VANCOCIN) 1,500 mg in sodium chloride 0.9 % 500 mL IVPB (1,500 mg Intravenous New Bag/Given 01/21/18 2150)  piperacillin-tazobactam (ZOSYN) IVPB 3.375 g (3.375 g Intravenous New Bag/Given 01/21/18 2149)  haloperidol lactate (HALDOL) injection 2 mg (2 mg Intravenous Given 01/21/18 2144)  acetaminophen (TYLENOL) suppository 650 mg (650 mg Rectal Given 01/21/18 2147)     Initial Impression / Assessment and Plan / ED Course  I have reviewed the triage vital signs and the nursing notes.  Pertinent labs & imaging results that were available during my care of the patient were reviewed by me and considered in my medical decision making (see chart for details).     77 yo M with a cc of altered mental status.  The patient was found to have a temperature here of 100.8.  Suspect infection is the cause.  Some delay to get urine or chest x-ray will cover broadly with antibiotics.  UA with uti.  CXR viewed by me with no pna.  Discussed with hospitalist for admission.  The patients results and plan  were reviewed and discussed.   Any x-rays performed were independently reviewed by myself.   Differential diagnosis were considered with the presenting HPI.  Medications  vancomycin (VANCOCIN) 1,500 mg in sodium chloride 0.9 % 500 mL IVPB (1,500 mg Intravenous New Bag/Given 01/21/18 2150)  piperacillin-tazobactam (ZOSYN) IVPB 3.375 g (3.375 g Intravenous New Bag/Given 01/21/18 2149)  haloperidol lactate (HALDOL) injection 2 mg (2 mg Intravenous Given 01/21/18 2144)    acetaminophen (TYLENOL) suppository 650 mg (650 mg Rectal Given 01/21/18 2147)    Vitals:   01/21/18 1900 01/21/18 2000 01/21/18 2100 01/21/18 2141  BP: (!) 107/51  (!) 171/91 114/60  Pulse: 84  93 89  Resp: 16  20 18   Temp:      TempSrc:      SpO2: 92%  94% 97%  Weight:  74.8 kg (165 lb)      Final diagnoses:  Acute pyelonephritis    Admission/ observation were discussed with the admitting physician, patient and/or family and they are comfortable with the plan.    Final Clinical Impressions(s) / ED Diagnoses   Final diagnoses:  Acute pyelonephritis    ED Discharge Orders    None       Melene Plan, DO 01/21/18 2239

## 2018-01-21 NOTE — ED Notes (Signed)
Patient transported to CT 

## 2018-01-21 NOTE — ED Notes (Signed)
Restrictions band placed on pt's left wrist. 

## 2018-01-21 NOTE — ED Triage Notes (Signed)
Pt from [facility] altered for two weeks.pt combative with EMS given 2.5mg  versed

## 2018-01-21 NOTE — ED Notes (Signed)
Informed Dr. Adela LankFloyd of pt's rectal temp.

## 2018-01-21 NOTE — ED Notes (Signed)
Pt combative and attemptive to pull out IV. Activity mittens placed on pt. Will continue to monitor.

## 2018-01-21 NOTE — ED Notes (Signed)
Patient transported to X-ray 

## 2018-01-21 NOTE — H&P (Addendum)
History and Physical    LONEY DOMINGO ZOX:[id number] DOB: 04/13/1941 DOA: 01/21/2018  Referring MD/NP/PA:   PCP: Molinda Bailiff, PA   Patient coming from:  The patient is coming from SNF.  At baseline, pt is dependent for most of ADL.     Chief Complaint: AMS  HPI: Marc Wheeler is a 77 y.o. male with medical history significant of s/p of bilateral BKA, hypertension, hyperlipidemia, diabetes mellitus, CAD, CHF, CKD-4, who presents with altered mental status.  Patient has AMS, and is unable to provide any medical history, therefore, most of the history is obtained by discussing the case with ED physician, per EMS report, and with the nursing staff.  Per report, pt is brought in from SNF due to AMS. Pt has been confused in the past several days. When I saw pt in ED, he knows her own name, but not oriented to time and place. No active cough, respiratory distress, nausea, vomiting, diarrhea noted. He moves all extremities. No sure if patient has chest pain, abdominal pain or symptoms of UTI.  ED Course: pt was found to have positive urinalysis with large amount of leukocyte, WBC 7.8, negative acid 2.04, worsening renal function, sodium 148, alcohol level less than 10,  temperature 100.8, no tachycardia, oxygen saturation 97% on room air. Chest x-ray negative. CT head negative for acute intracranial abnormalities. . Patient is admitted to telemetry bed as inpatient.  Review of Systems: could not be reviewed due to altered mental status..  Allergy: No Known Allergies  Past Medical History:  Diagnosis Date  . Anemia   . Angina pectoris (HCC)   . CHF (congestive heart failure) (HCC)   . Chronic kidney disease   . Coronary artery disease   . Depression   . Diabetes mellitus without complication (HCC)   . Diabetic retinopathy (HCC)   . Edema   . Hyperlipemia   . Hyperlipidemia   . Hyperlipidemia LDL goal <100 07/16/2014  . Hypertension   . Muscle weakness   . Neuropathy   . Secondary  hyperaldosteronism (HCC)   . Vascular headache     Past Surgical History:  Procedure Laterality Date  . APPENDECTOMY    . AV FISTULA PLACEMENT Left 12/31/2017   Procedure: INSERTION OF ARTERIOVENOUS (AV) GORE-TEX GRAFT ARM;  Surgeon: Chuck Hint, MD;  Location: [facility];  Service: Vascular;  Laterality: Left;  . LEG AMPUTATION BELOW KNEE Bilateral   . right retinal detachment repair  1 22 14     Social History:  reports that he has quit smoking. His smoking use included cigars. He has a 225.00 pack-year smoking history. He has never used smokeless tobacco. He reports that he does not drink alcohol or use drugs.  Family History:  Family History  Problem Relation Age of Onset  . Cancer Mother   . Diabetes Mother   . Cancer Sister      Prior to Admission medications   Medication Sig Start Date End Date Taking? Authorizing Provider  acetaminophen (TYLENOL) 325 MG tablet Take 650 mg by mouth every 4 (four) hours as needed (pain).    Yes [provider]  amLODipine (NORVASC) 10 MG tablet Take 10 mg by mouth daily. Reported on 12/29/2015   Yes [provider]  aspirin 81 MG tablet Take 81 mg by mouth daily.   Yes [provider]  atorvastatin (LIPITOR) 80 MG tablet Take 80 mg by mouth every evening.    Yes [provider]  b complex-vitamin c-folic  acid (NEPHRO-VITE) 0.8 MG TABS Take 1 tablet by mouth at bedtime.    Yes [provider]  calcitRIOL (ROCALTROL) 0.25 MCG capsule Take 0.25 mcg by mouth every Monday, Wednesday, and Friday.    Yes [provider]  Carboxymethylcellulose Sodium (REFRESH LIQUIGEL OP) Place 1 drop into both eyes 3 (three) times daily.   Yes [provider]  cholecalciferol (VITAMIN D) 1000 units tablet Take 1,000 Units by mouth daily.   Yes [provider]  docusate sodium (COLACE) 100 MG capsule Take 100 mg by mouth daily.    Yes [provider]  ENSURE (ENSURE) Take 237 mLs by  mouth 4 (four) times daily.   Yes [provider]  fish oil-omega-3 fatty acids 1000 MG capsule Take 1 g by mouth daily.   Yes [provider]  furosemide (LASIX) 40 MG tablet Take 40 mg by mouth daily.   Yes [provider]  glucagon (GLUCAGON EMERGENCY) 1 MG injection Inject 1 mg into the muscle once as needed (for hypoglycemia).    Yes [provider]  guaiFENesin (ROBITUSSIN) 100 MG/5ML SOLN Take 10 mLs by mouth every 6 (six) hours as needed for cough or to loosen phlegm.   Yes [provider]  Insulin Glargine (TOUJEO SOLOSTAR) 300 UNIT/ML SOPN Inject 50 Units into the skin at bedtime.    Yes [provider]  insulin lispro (HUMALOG KWIKPEN) 100 UNIT/ML KiwkPen Inject 6-16 Units into the skin See admin instructions. Inject 6-16 units into the skin three times a day before meals, per sliding scale: BGL 151-200 = 6 units; 201-250 = 8 units; 251-300 = 10 units; 301-350 = 12 units; 351-400 = 14 units; 401+ = 16 units- For a BGL < 60, notify MD immediately and >400, administer 16 units and notify MD   Yes [provider]  linagliptin (TRADJENTA) 5 MG TABS tablet Take 5 mg by mouth daily.   Yes [provider]  loratadine (CLARITIN) 10 MG tablet Take 10 mg by mouth daily.    Yes [provider]  metoprolol tartrate (LOPRESSOR) 50 MG tablet Take 50 mg by mouth 2 (two) times daily.   Yes [provider]  oxyCODONE-acetaminophen (PERCOCET/ROXICET) 5-325 MG tablet Take 1 tablet by mouth every 6 (six) hours as needed. Patient taking differently: Take 1 tablet by mouth every 6 (six) hours as needed (for pain).  12/31/17  Yes Lars Mage, PA-C  senna (SENOKOT) 8.6 MG TABS tablet Take 1 tablet by mouth 2 (two) times daily. FOR 5 DAYS 25-Jan-2018 01/24/18 Yes [provider]  clobetasol cream (TEMOVATE) 0.05 % Apply 1 application topically every 4 (four) hours as needed (rash). Apply to right BKA stump    [provider]  cloNIDine (CATAPRES - DOSED IN MG/24 HR) 0.2 mg/24hr patch Place 1 patch onto the skin once a week. Friday    [provider]  metoprolol tartrate (LOPRESSOR) 100 MG tablet Take 0.5 tablets (50 mg total) by mouth 2 (two) times daily. Patient not taking: Reported on 01/21/2018 01/06/18   Alwyn Ren, MD  polyethylene glycol [facility] / Ethelene Hal) packet Take 17 g by mouth daily.    [provider]    Physical Exam: Vitals:   01/21/18 2100 01/21/18 2141 01/21/18 2200 01/22/18 0100  BP: (!) 171/91 114/60 110/71 (!) 105/92  Pulse: 93 89 90 88  Resp: 20 18 (!) 21 (!) 21  Temp:      TempSrc:  SpO2: 94% 97% 94% 100%  Weight:       General: Not in acute distress. Dry mucus and membrane. HEENT:       Eyes: PERRL, EOMI, no scleral icterus.       ENT: No discharge from the ears and nose, no pharynx injection, no tonsillar enlargement.        Neck: No JVD, no bruit, no mass felt. Heme: No neck lymph node enlargement. Cardiac: S1/S2, RRR, No murmurs, No gallops or rubs. Respiratory: No rales, wheezing, rhonchi or rubs. GI: Soft, nondistended, nontender, no organomegaly, BS present. GU: No hematuria Ext: No pitting leg edema bilaterally. S/p of bilaterally BKA. S/p of AVF in left arm. Musculoskeletal: No joint deformities, No joint redness or warmth, no limitation of ROM in spin. Skin: No rashes.  Neuro: confused, know his own name, but not oriented to place and time, cranial nerves II-XII grossly intact, moves all extremities. Psych: Patient is not psychotic.  Labs on Admission: I have personally reviewed following labs and imaging studies  CBC: Recent Labs  Lab 01/21/18 1858 01/21/18 1902  WBC  --  7.8  NEUTROABS  --  5.6  HGB 12.2* 11.0*  HCT 36.0* 35.3*  MCV  --  83.6  PLT  --  219   Basic Metabolic Panel: Recent Labs  Lab 01/21/18 1858 01/21/18 1902  NA 151* 148*  K 4.7 4.7  CL 117* 113*  CO2  --  19*  GLUCOSE 163* 163*  BUN  80* 99*  CREATININE 6.40* 6.26*  CALCIUM  --  9.6   GFR: CrCl cannot be calculated (Unknown ideal weight.). Liver Function Tests: Recent Labs  Lab 01/21/18 1902  AST 39  ALT 36  ALKPHOS 82  BILITOT 0.9  PROT 8.5*  ALBUMIN 3.1*   No results for input(s): LIPASE, AMYLASE in the last 168 hours. Recent Labs  Lab 01/21/18 1913  AMMONIA 60*   Coagulation Profile: No results for input(s): INR, PROTIME in the last 168 hours. Cardiac Enzymes: No results for input(s): CKTOTAL, CKMB, CKMBINDEX, TROPONINI in the last 168 hours. BNP (last 3 results) No results for input(s): PROBNP in the last 8760 hours. HbA1C: No results for input(s): HGBA1C in the last 72 hours. CBG: Recent Labs  Lab 01/22/18 0134  GLUCAP 144*   Lipid Profile: No results for input(s): CHOL, HDL, LDLCALC, TRIG, CHOLHDL, LDLDIRECT in the last 72 hours. Thyroid Function Tests: No results for input(s): TSH, T4TOTAL, FREET4, T3FREE, THYROIDAB in the last 72 hours. Anemia Panel: No results for input(s): VITAMINB12, FOLATE, FERRITIN, TIBC, IRON, RETICCTPCT in the last 72 hours. Urine analysis:    Component Value Date/Time   COLORURINE AMBER (A) 01/21/2018 2139   APPEARANCEUR CLOUDY (A) 01/21/2018 2139   LABSPEC 1.014 01/21/2018 2139   PHURINE 5.0 01/21/2018 2139   GLUCOSEU NEGATIVE 01/21/2018 2139   HGBUR MODERATE (A) 01/21/2018 2139   BILIRUBINUR NEGATIVE 01/21/2018 2139   KETONESUR 5 (A) 01/21/2018 2139   PROTEINUR 100 (A) 01/21/2018 2139   UROBILINOGEN 0.2 11/29/2014 1524   NITRITE NEGATIVE 01/21/2018 2139   LEUKOCYTESUR LARGE (A) 01/21/2018 2139   Sepsis Labs: @LABRCNTIP (procalcitonin:4,lacticidven:4) )No results found for this or any previous visit (from the past 240 hour(s)).   Radiological Exams on Admission: Dg Chest 2 View  Result Date: 01/21/2018 CLINICAL DATA:  Altered mental status. EXAM: CHEST - 2 VIEW COMPARISON:  Chest x-ray dated August 25, 2015. FINDINGS: The heart size and mediastinal  contours are within normal limits. Normal pulmonary vascularity. Unchanged  elevated left hemidiaphragm with left lower lobe atelectasis. The right lung is clear. No focal consolidation, pleural effusion, or pneumothorax. No acute osseous abnormality. IMPRESSION: No active cardiopulmonary disease. Electronically Signed   By: Obie Dredge M.D.   On: 01/21/2018 20:56   Ct Head Wo Contrast  Result Date: 01/21/2018 CLINICAL DATA:  Altered mental status and combativeness for 2 weeks. EXAM: CT HEAD WITHOUT CONTRAST TECHNIQUE: Contiguous axial images were obtained from the base of the skull through the vertex without intravenous contrast. COMPARISON:  None. FINDINGS: Brain: No evidence of acute infarction, hemorrhage, hydrocephalus, extra-axial collection or mass lesion/mass effect. Cortical atrophy is noted. Vascular: Extensive atherosclerosis. Skull: Intact. Sinuses/Orbits: No acute abnormality. The patient is status post bilateral lens extraction. Other: None. IMPRESSION: No acute abnormality. Atrophy. Extensive atherosclerosis. Electronically Signed   By: Drusilla Kanner M.D.   On: 01/21/2018 19:27     EKG:   Not done in ED, will get one.   Assessment/Plan Principal Problem:   UTI (urinary tract infection) Active Problems:   Essential hypertension   CAD (coronary artery disease)   S/P bilateral BKA (below knee amputation) (HCC)   Acute renal failure superimposed on stage 4 chronic kidney disease (HCC)   Type II diabetes mellitus with renal manifestations (HCC)   Acute metabolic encephalopathy   Sepsis (HCC)   Hypernatremia   Chronic CHF (congestive heart failure) (HCC)   Sepsis due to UTI (urinary tract infection): pt meets criteria for sepsis with fever and tachypnea. Lactic acid is elevated at 2.04. Currently hemodynamically stable.  - Admit to telemetry bed as inpt - Ceftriaxone by IV (pt Received one dose of vancomycin and Zosyn in ED) - Follow up results of urine and blood cx and  amend antibiotic regimen if needed per sensitivity results - prn Zofran for nausea - will get Procalcitonin and trend lactic acid levels per sepsis protocol. - IVF: 500 L of NS bolus in ED, followed by D5-1/2 at 100 cc/h  Hypernatremia: mild. Na 148.  - IVF: 500 L of NS bolus in ED, followed by D5-1/2 at 100 cc/h -f/u by BMP  Essential hypertension: -hold oral meds until mental status improves -IV hydralazine when necessary  Hx of CAD (coronary artery disease): no CP -ASA per rectum  Type II diabetes mellitus with renal manifestations: Last A1c 7.2 on 02/09/17, fairely controled. Patient is taking glargine insulin, Humalog, tradjenta at home -will decrease glargine insulin dose from   50 to 30 U daily -SSI  AoCKD-IV: Baseline Cre is 2.0-2.5, pt's Cre is 6.26 and BJN 97 on admission. Likely due to prerenal secondary to dehydration and continuation of diuretics. UTI is also contributed partially. Pt has AVF placed in left arm on 12/31/17. - IVF as above - Check FeUrea - Follow up renal function by BMP  Acute metabolic encephalopathy: likely due to UTI. -frequent neuro checks -hold oral meds until mental status improves  Chronic CHF: no 2-D echo on record, not sure which type of CHF. Patient does not have leg edema or JVD. Patient is clinically dry on admission. CHF is compensated. -Hold Lasix -Check a BMP   DVT ppx: SQ Heparin Code Status: Full code Family Communication: None at bed side.    Disposition Plan:  Anticipate discharge back to SNF Consults called:  none Admission status:   Inpatient/tele      Date of Service 01/22/2018    Lorretta Harp [facility] Pager [phone]  If 7PM-7AM, please contact night-coverage [email or web address] Password [email or web address] 01/22/2018, 2:23 AM

## 2018-01-21 NOTE — ED Notes (Signed)
Niu, MD at bedside. °

## 2018-01-22 ENCOUNTER — Inpatient Hospital Stay (HOSPITAL_COMMUNITY): Payer: Medicare Other

## 2018-01-22 ENCOUNTER — Other Ambulatory Visit: Payer: Self-pay

## 2018-01-22 ENCOUNTER — Encounter (HOSPITAL_COMMUNITY): Payer: Self-pay

## 2018-01-22 DIAGNOSIS — Z794 Long term (current) use of insulin: Secondary | ICD-10-CM

## 2018-01-22 DIAGNOSIS — I25119 Atherosclerotic heart disease of native coronary artery with unspecified angina pectoris: Secondary | ICD-10-CM

## 2018-01-22 DIAGNOSIS — R339 Retention of urine, unspecified: Secondary | ICD-10-CM

## 2018-01-22 DIAGNOSIS — N390 Urinary tract infection, site not specified: Secondary | ICD-10-CM

## 2018-01-22 DIAGNOSIS — N184 Chronic kidney disease, stage 4 (severe): Secondary | ICD-10-CM

## 2018-01-22 DIAGNOSIS — G9341 Metabolic encephalopathy: Secondary | ICD-10-CM

## 2018-01-22 DIAGNOSIS — I10 Essential (primary) hypertension: Secondary | ICD-10-CM

## 2018-01-22 DIAGNOSIS — I509 Heart failure, unspecified: Secondary | ICD-10-CM

## 2018-01-22 DIAGNOSIS — A419 Sepsis, unspecified organism: Principal | ICD-10-CM

## 2018-01-22 DIAGNOSIS — E1122 Type 2 diabetes mellitus with diabetic chronic kidney disease: Secondary | ICD-10-CM

## 2018-01-22 DIAGNOSIS — Z89512 Acquired absence of left leg below knee: Secondary | ICD-10-CM

## 2018-01-22 DIAGNOSIS — E870 Hyperosmolality and hypernatremia: Secondary | ICD-10-CM

## 2018-01-22 DIAGNOSIS — Z89511 Acquired absence of right leg below knee: Secondary | ICD-10-CM

## 2018-01-22 DIAGNOSIS — N179 Acute kidney failure, unspecified: Secondary | ICD-10-CM

## 2018-01-22 DIAGNOSIS — I5032 Chronic diastolic (congestive) heart failure: Secondary | ICD-10-CM

## 2018-01-22 LAB — GLUCOSE, CAPILLARY
GLUCOSE-CAPILLARY: 101 mg/dL — AB (ref 65–99)
GLUCOSE-CAPILLARY: 152 mg/dL — AB (ref 65–99)
Glucose-Capillary: 196 mg/dL — ABNORMAL HIGH (ref 65–99)

## 2018-01-22 LAB — CBG MONITORING, ED: GLUCOSE-CAPILLARY: 144 mg/dL — AB (ref 65–99)

## 2018-01-22 LAB — CBC
HCT: 32.6 % — ABNORMAL LOW (ref 39.0–52.0)
Hemoglobin: 10.2 g/dL — ABNORMAL LOW (ref 13.0–17.0)
MCH: 26.3 pg (ref 26.0–34.0)
MCHC: 31.3 g/dL (ref 30.0–36.0)
MCV: 84 fL (ref 78.0–100.0)
PLATELETS: 205 10*3/uL (ref 150–400)
RBC: 3.88 MIL/uL — ABNORMAL LOW (ref 4.22–5.81)
RDW: 17 % — AB (ref 11.5–15.5)
WBC: 9.5 10*3/uL (ref 4.0–10.5)

## 2018-01-22 LAB — LACTIC ACID, PLASMA
Lactic Acid, Venous: 1.5 mmol/L (ref 0.5–1.9)
Lactic Acid, Venous: 1.5 mmol/L (ref 0.5–1.9)

## 2018-01-22 LAB — BASIC METABOLIC PANEL
Anion gap: 17 — ABNORMAL HIGH (ref 5–15)
BUN: 95 mg/dL — AB (ref 6–20)
CALCIUM: 8.8 mg/dL — AB (ref 8.9–10.3)
CO2: 17 mmol/L — ABNORMAL LOW (ref 22–32)
CREATININE: 6.42 mg/dL — AB (ref 0.61–1.24)
Chloride: 112 mmol/L — ABNORMAL HIGH (ref 101–111)
GFR calc Af Amer: 9 mL/min — ABNORMAL LOW (ref >60)
GFR calc non Af Amer: 7 mL/min — ABNORMAL LOW (ref >60)
Glucose, Bld: 199 mg/dL — ABNORMAL HIGH (ref 65–99)
Potassium: 4.4 mmol/L (ref 3.5–5.1)
SODIUM: 146 mmol/L — AB (ref 135–145)

## 2018-01-22 LAB — CREATININE, URINE, RANDOM: CREATININE, URINE: 235.26 mg/dL

## 2018-01-22 LAB — BRAIN NATRIURETIC PEPTIDE: B Natriuretic Peptide: 91 pg/mL (ref 0.0–100.0)

## 2018-01-22 MED ORDER — HALOPERIDOL LACTATE 5 MG/ML IJ SOLN
1.0000 mg | Freq: Once | INTRAMUSCULAR | Status: AC
  Administered 2018-01-22: 1 mg via INTRAVENOUS

## 2018-01-22 MED ORDER — BISACODYL 10 MG RE SUPP
10.0000 mg | Freq: Every day | RECTAL | Status: AC
  Filled 2018-01-22: qty 1

## 2018-01-22 MED ORDER — INSULIN GLARGINE 100 UNIT/ML ~~LOC~~ SOLN
15.0000 [IU] | Freq: Once | SUBCUTANEOUS | Status: AC
  Administered 2018-01-22: 15 [IU] via SUBCUTANEOUS
  Filled 2018-01-22: qty 0.15

## 2018-01-22 MED ORDER — HALOPERIDOL LACTATE 5 MG/ML IJ SOLN
INTRAMUSCULAR | Status: AC
  Filled 2018-01-22: qty 1

## 2018-01-22 NOTE — Progress Notes (Signed)
Pt given opportunitys to void with no success, cath kit received from central supply, pt in and out cath initially cloudy urine then  At the end of emptying was white mucousy thicker, 633m drained, pt tolerated but did need assist x2  To cath pt

## 2018-01-22 NOTE — Progress Notes (Signed)
Chaplain followed up on a referral from Chaplain Resident to provide an Advance Directive for the patient as requested by his son.  Upon entering the patient's room it was observed by the Chaplain and his assigned Nurse that the patient was confused, and unable to complete the AD. The son was not present at this time, the AD was left in the patient's room, the Nurse was informed for completion with the patient if possible when he is coherent. Chaplain Janell QuietAudrey Rolin Schult, [phone]

## 2018-01-22 NOTE — Progress Notes (Signed)
Initial Nutrition Assessment  DOCUMENTATION CODES:   Non-severe (moderate) malnutrition in context of chronic illness  INTERVENTION:   -RD will follow for diet advancement and supplement as appropriate  NUTRITION DIAGNOSIS:   Moderate Malnutrition related to chronic illness(CHF, CKD) as evidenced by mild fat depletion, moderate fat depletion, mild muscle depletion, moderate muscle depletion.  GOAL:   Patient will meet greater than or equal to 90% of their needs  MONITOR:   Diet advancement, Labs, Weight trends, Skin, I & O's  REASON FOR ASSESSMENT:   Malnutrition Screening Tool    ASSESSMENT:   Marc Wheeler is a 77 y.o. 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   Pt admitted with sepsis due to UTI and acute metabolic encephalopathy.   Pt very lethargic at time of visit. He responded minimally to touch. No family present to obtain further hx. SNF records not available in shadow chart.   Wt hx reviewed. Noted a 15.9% wt loss over the past month, however, wt changes difficult to assess due to hx of CHF and CKD. Suspect PO intake was poor PTA.   Palliative care consult pending to discuss goals of care.   Labs reviewed: Na: 146, CBGS: 101-196 (inpatient orders for glycemic control are 0-9 units insulin aspart TID with meals and 30 units insulin glargine q HS).   NUTRITION - FOCUSED PHYSICAL EXAM:    Most Recent Value  Orbital Region  Moderate depletion  Upper Arm Region  Moderate depletion  Thoracic and Lumbar Region  Unable to assess  Buccal Region  Moderate depletion  Temple Region  Moderate depletion  Clavicle Bone Region  Mild depletion  Clavicle and Acromion Bone Region  Mild depletion  Scapular Bone Region  Mild depletion  Dorsal Hand  Unable to assess  Patellar Region  Mild depletion  Anterior Thigh Region  Moderate  depletion  Posterior Calf Region  Unable to assess  Edema (RD Assessment)  None  Hair  Reviewed  Eyes  Reviewed  Mouth  Reviewed  Skin  Reviewed  Nails  Reviewed       Diet Order:  Diet NPO time specified  EDUCATION NEEDS:   Not appropriate for education at this time  Skin:  Skin Assessment: Reviewed RN Assessment  Last BM:  PTA  Height:   Ht Readings from Last 1 Encounters:  04/02/17 4\' 2"  (1.27 m)   Weight:   Wt Readings from Last 1 Encounters:  01/22/18 142 lb 3.2 oz (64.5 kg)    Ideal Body Weight:   Unable to obtain due to no available prior BKA ht  BMI:  Body mass index is 39.99 kg/m.  Estimated Nutritional Needs:   Kcal:  1400-1600  Protein:  70-85 grams  Fluid:  1.4-1.6 L    Chanz Cahall A. Mayford KnifeWilliams, RD, LDN, CDE Pager: [phone] After hours Pager: [phone]

## 2018-01-22 NOTE — Progress Notes (Signed)
Pt has a schedule Lantus insulin 30 u, pt is NPO. NP on call paged if ok to give the 30 u. NP ordered to give 15 u instead of 30 u tonight. Will continue to monitor pt.

## 2018-01-22 NOTE — Progress Notes (Signed)
Palliative:  I have spoken briefly with Marc Wheeler's son and left a voicemail with his daughter. Children will speak and let me know a time we can hopefully meet and discuss Marc Wheeler's care tomorrow 01/23/18. Thank you for this consult.   No charge  Yong ChannelAlicia Rulon Abdalla, NP Palliative Medicine Team Pager # [phone] (M-F 8a-5p) Team Phone # [phone] (Nights/Weekends)

## 2018-01-22 NOTE — Progress Notes (Signed)
Palliative Medicine RN Note: Patient's outpatient provider from [facility] called PMT medical director requesting we see Mr Marc Wheeler(Marc Wheeler, [name]. Per his report, patient has been saying he wants to stop HD and is only continuing for his family. On 4/2, [facility] and SNF "made the family RP" to allow them to make decisions, but patient declined and came to the ED before that meeting could be held.   I called Dr Jarvis NewcomerGrunz, his attending here, and he agreed that PMT involvement is appropriate. Consult ordered. PMT will see him as soon as there is an available provider.  Margret ChanceMelanie G. Zakkery Dorian, RN, BSN, [facility] Palliative Medicine Team 01/22/2018 10:45 AM Office [phone]

## 2018-01-22 NOTE — Consult Note (Signed)
Reason for Consult: AKI/CKD stage 4 Referring Physician: Jarvis NewcomerGrunz, MD  Marc Wheeler is an 77 y.o. male.  HPI: Marc Wheeler is well known to me from outpatient follow up for his progressive CKD stage 4 due to longstanding, poorly controlled DM and HTN.  He was seen in our office on 01/21/18 and he was noted to be very somnolent and labs revealed AKI/CKD.  His SNF was contacted and directed to send [facility] to [facility] ED for further evaluation.  He was subsequently admitted and noted to have worsening renal function and GNR urosepsis.  He has been started on empiric antibiotics and we were consulted to help further evaluate and manage his AKI/CKD.  We have had multiple discussions regarding his progressive kidney disease and whether or not to proceed with renal replacement therapy.  He is a poor dialysis candidate given his multiple comorbidities and poor functional status, however he stated that he would do it "if I had to".  He had an AVG placed on 12/31/17.  He remains somnolent and delirious so unable to obtain further information from him.  Trend in Creatinine: Creatinine, Ser  Date/Time Value Ref Range Status  01/22/2018 03:25 AM 6.42 (H) 0.61 - 1.24 mg/dL Final  16/10/960404/11/2017 54:0907:02 PM 6.26 (H) 0.61 - 1.24 mg/dL Final  81/19/147804/11/2017 29:5606:58 PM 6.40 (H) 0.61 - 1.24 mg/dL Final  21/30/865703/17/2019 84:6904:03 AM 2.02 (H) 0.61 - 1.24 mg/dL Final  62/95/284103/16/2019 32:4409:08 AM 2.24 (H) 0.61 - 1.24 mg/dL Final  01/02/725303/15/2019 66:4411:39 PM 2.54 (H) 0.61 - 1.24 mg/dL Final  03/47/425902/19/2018 2.4 (A) 0.6 - 1.3 mg/dL Final  56/38/756409/08/2016 2.3 (A) 0.6 - 1.3 mg/dL Final  33/29/518808/28/2017 2.2 (A) 0.6 - 1.3 mg/dL Final  41/66/063011/12/2014 16:0109:50 AM 2.16 (H) 0.61 - 1.24 mg/dL Final  09/32/355703/13/2016 32:2005:40 AM 2.29 (H) 0.50 - 1.35 mg/dL Final  25/42/706203/09/2015 37:6208:50 PM 2.37 (H) 0.50 - 1.35 mg/dL Final  83/15/176103/09/2015 60:7305:00 PM 2.35 (H) 0.50 - 1.35 mg/dL Final    PMH:   Past Medical History:  Diagnosis Date  . Anemia   . Angina pectoris (HCC)   . CHF (congestive heart failure) (HCC)   .  Chronic kidney disease   . Coronary artery disease   . Depression   . Diabetes mellitus without complication (HCC)   . Diabetic retinopathy (HCC)   . Edema   . Hyperlipemia   . Hyperlipidemia   . Hyperlipidemia LDL goal <100 07/16/2014  . Hypertension   . Muscle weakness   . Neuropathy   . Secondary hyperaldosteronism (HCC)   . Vascular headache     PSH:   Past Surgical History:  Procedure Laterality Date  . APPENDECTOMY    . AV FISTULA PLACEMENT Left 12/31/2017   Procedure: INSERTION OF ARTERIOVENOUS (AV) GORE-TEX GRAFT ARM;  Surgeon: Chuck Hintickson, Christopher S, MD;  [facility];  Service: Vascular;  Laterality: Left;  . LEG AMPUTATION BELOW KNEE Bilateral   . right retinal detachment repair  1 22 14     Allergies: No Known Allergies  Medications:   Prior to Admission medications   Medication Sig Start Date End Date Taking? Authorizing Provider  acetaminophen (TYLENOL) 325 MG tablet Take 650 mg by mouth every 4 (four) hours as needed (pain).    Yes [provider]  amLODipine (NORVASC) 10 MG tablet Take 10 mg by mouth daily. Reported on 12/29/2015   Yes [provider]  aspirin 81 MG tablet Take 81 mg by mouth daily.   Yes [provider]  atorvastatin (LIPITOR) 80 MG tablet Take 80 mg by mouth every evening.    Yes [provider]  b complex-vitamin c-folic acid (NEPHRO-VITE) 0.8 MG TABS Take 1 tablet by mouth at bedtime.    Yes [provider]  calcitRIOL (ROCALTROL) 0.25 MCG capsule Take 0.25 mcg by mouth every Monday, Wednesday, and Friday.    Yes [provider]  Carboxymethylcellulose Sodium (REFRESH LIQUIGEL OP) Place 1 drop into both eyes 3 (three) times daily.   Yes [provider]  cholecalciferol (VITAMIN D) 1000 units tablet Take 1,000 Units by mouth daily.   Yes [provider]  docusate sodium (COLACE) 100 MG capsule Take 100 mg by mouth daily.    Yes [provider]  ENSURE (ENSURE)  Take 237 mLs by mouth 4 (four) times daily.   Yes [provider]  fish oil-omega-3 fatty acids 1000 MG capsule Take 1 g by mouth daily.   Yes [provider]  furosemide (LASIX) 40 MG tablet Take 40 mg by mouth daily.   Yes [provider]  glucagon (GLUCAGON EMERGENCY) 1 MG injection Inject 1 mg into the muscle once as needed (for hypoglycemia).    Yes [provider]  guaiFENesin (ROBITUSSIN) 100 MG/5ML SOLN Take 10 mLs by mouth every 6 (six) hours as needed for cough or to loosen phlegm.   Yes [provider]  Insulin Glargine (TOUJEO SOLOSTAR) 300 UNIT/ML SOPN Inject 50 Units into the skin at bedtime.    Yes [provider]  insulin lispro (HUMALOG KWIKPEN) 100 UNIT/ML KiwkPen Inject 6-16 Units into the skin See admin instructions. Inject 6-16 units into the skin three times a day before meals, per sliding scale: BGL 151-200 = 6 units; 201-250 = 8 units; 251-300 = 10 units; 301-350 = 12 units; 351-400 = 14 units; 401+ = 16 units- For a BGL < 60, notify MD immediately and >400, administer 16 units and notify MD   Yes [provider]  linagliptin (TRADJENTA) 5 MG TABS tablet Take 5 mg by mouth daily.   Yes [provider]  loratadine (CLARITIN) 10 MG tablet Take 10 mg by mouth daily.    Yes [provider]  metoprolol tartrate (LOPRESSOR) 50 MG tablet Take 50 mg by mouth 2 (two) times daily.   Yes [provider]  oxyCODONE-acetaminophen (PERCOCET/ROXICET) 5-325 MG tablet Take 1 tablet by mouth every 6 (six) hours as needed. Patient taking differently: Take 1 tablet by mouth every 6 (six) hours as needed (for pain).  12/31/17  Yes Lars Mage, PA-C  senna (SENOKOT) 8.6 MG TABS tablet Take 1 tablet by mouth 2 (two) times daily. FOR 5 DAYS 02/05/2018 01/24/18 Yes [provider]  clobetasol cream (TEMOVATE) 0.05 % Apply 1 application topically every 4 (four) hours as needed (rash). Apply to right BKA stump     [provider]  cloNIDine (CATAPRES - DOSED IN MG/24 HR) 0.2 mg/24hr patch Place 1 patch onto the skin once a week. Friday    [provider]  metoprolol tartrate (LOPRESSOR) 100 MG tablet Take 0.5 tablets (50 mg total) by mouth 2 (two) times daily. Patient not taking: Reported on 01/21/2018 01/06/18   Alwyn Ren, MD  polyethylene glycol [facility]) packet Take 17 g by mouth daily.    [provider]    Inpatient medications: . aspirin  150 mg Rectal Daily  . bisacodyl  10 mg Rectal Daily  . heparin  5,000 Units Subcutaneous Q8H  .  insulin aspart  0-9 Units Subcutaneous TID WC  . insulin glargine  30 Units Subcutaneous QHS    Discontinued Meds:   Medications Discontinued During This Encounter  Medication Reason  . Insulin Glargine SOPN 25 Units     Social History:  reports that he has quit smoking. His smoking use included cigars. He has a 225.00 pack-year smoking history. He has never used smokeless tobacco. He reports that he does not drink alcohol or use drugs.  Family History:   Family History  Problem Relation Age of Onset  . Cancer Mother   . Diabetes Mother   . Cancer Sister     Review of systems not obtained due to patient factors. Weight change:   Intake/Output Summary (Last 24 hours) at 01/22/2018 1618 Last data filed at 01/22/2018 1439 Gross per 24 hour  Intake 2206.67 ml  Output 676 ml  Net 1530.67 ml   BP (!) 112/46 (BP Location: Right Arm)   Pulse 79   Temp 97.9 F (36.6 C) (Oral)   Resp 18   Wt 64.5 kg (142 lb 3.2 oz) Comment: bed  SpO2 99%   BMI 39.99 kg/m  Vitals:   01/22/18 0230 01/22/18 0336 01/22/18 0344 01/22/18 1252  BP: 123/60 120/68  (!) 112/46  Pulse: 87 87  79  Resp: (!) 21 16  18   Temp:  97.7 F (36.5 C)  97.9 F (36.6 C)  TempSrc:  Oral  Oral  SpO2: 98% 99%  99%  Weight:   64.5 kg (142 lb 3.2 oz)      General appearance: delirious and fatigued Head: Normocephalic, without obvious  abnormality, atraumatic Resp: clear to auscultation bilaterally Cardio: no rub GI: soft, non-tender; bowel sounds normal; no masses,  no organomegaly Extremities: s/p bilateral BKA's, no edema, Left forearm AVG +T/B  Labs: Basic Metabolic Panel: Recent Labs  Lab 01/21/18 1858 01/21/18 1902 01/22/18 0325  NA 151* 148* 146*  K 4.7 4.7 4.4  CL 117* 113* 112*  CO2  --  19* 17*  GLUCOSE 163* 163* 199*  BUN 80* 99* 95*  CREATININE 6.40* 6.26* 6.42*  ALBUMIN  --  3.1*  --   CALCIUM  --  9.6 8.8*   Liver Function Tests: Recent Labs  Lab 01/21/18 1902  AST 39  ALT 36  ALKPHOS 82  BILITOT 0.9  PROT 8.5*  ALBUMIN 3.1*   No results for input(s): LIPASE, AMYLASE in the last 168 hours. Recent Labs  Lab 01/21/18 1913  AMMONIA 60*   CBC: Recent Labs  Lab 01/21/18 1858 01/21/18 1902 01/22/18 0325  WBC  --  7.8 9.5  NEUTROABS  --  5.6  --   HGB 12.2* 11.0* 10.2*  HCT 36.0* 35.3* 32.6*  MCV  --  83.6 84.0  PLT  --  219 205   PT/INR: @LABRCNTIP (inr:5) Cardiac Enzymes: )No results for input(s): CKTOTAL, CKMB, CKMBINDEX, TROPONINI in the last 168 hours. CBG: Recent Labs  Lab 01/22/18 0134 01/22/18 1135  GLUCAP 144* 196*    Iron Studies: No results for input(s): IRON, TIBC, TRANSFERRIN, FERRITIN in the last 168 hours.  Xrays/Other Studies: Dg Chest 2 View  Result Date: 01/21/2018 CLINICAL DATA:  Altered mental status. EXAM: CHEST - 2 VIEW COMPARISON:  Chest x-ray dated August 25, 2015. FINDINGS: The heart size and mediastinal contours are within normal limits. Normal pulmonary vascularity. Unchanged elevated left hemidiaphragm with left lower lobe atelectasis. The right lung is clear. No focal consolidation, pleural effusion, or pneumothorax. No acute  osseous abnormality. IMPRESSION: No active cardiopulmonary disease. Electronically Signed   By: Obie Dredge M.D.   On: 01/21/2018 20:56   Ct Head Wo Contrast  Result Date: 01/21/2018 CLINICAL DATA:  Altered mental  status and combativeness for 2 weeks. EXAM: CT HEAD WITHOUT CONTRAST TECHNIQUE: Contiguous axial images were obtained from the base of the skull through the vertex without intravenous contrast. COMPARISON:  None. FINDINGS: Brain: No evidence of acute infarction, hemorrhage, hydrocephalus, extra-axial collection or mass lesion/mass effect. Cortical atrophy is noted. Vascular: Extensive atherosclerosis. Skull: Intact. Sinuses/Orbits: No acute abnormality. The patient is status post bilateral lens extraction. Other: None. IMPRESSION: No acute abnormality. Atrophy. Extensive atherosclerosis. Electronically Signed   By: Drusilla Kanner M.D.   On: 01/21/2018 19:27     Assessment/Plan: 1.  AKI/CKD stage 4- agree that he appears volume depleted and concur with IVF's and holding diuretics.  He is a poor dialysis candidate so I fully support Palliative care consult to help set goals/limits of care.  No indication for dialysis at this time.  Agree with holding vancomycin. 2. Klebsiella UTI- Currently on rocephin 3. Altered mental status/acute metabolic encephalopathy - likely due to UTI and AKI. 4. Bladder outlet obstruction/urinary retention- improved after foley catheter inserted. 5. PVD- s/p bilateral BKA's 6. hypernatremia- improving with IVF's 7. DM type 2- per primary 8. CAD- stable 9. Chronic diastolic CHF- volume depleted as above.  Lasix on hold 10. HTN- low bp off of meds due to infection 11. Anemia of CKd stage 3. Was started on aranesp sq every 4 weeks in March, received dose on 01/10/18 12. Disposition- pt is not yet on dialysis but agree with palliative care consult as he is a poor long term dialysis candidate given his advanced age, multiple co-morbidities, and poor functional status.   Julien Nordmann Alec Mcphee 01/22/2018, 4:18 PM

## 2018-01-22 NOTE — Progress Notes (Signed)
PROGRESS NOTE  Marc Wheeler  AVW:[id number] DOB: 21-Aug-1941 DOA: 01/21/2018 PCP: Molinda Bailiff, PA   Brief Narrative: Marc Wheeler is a 77 y.o. 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 On arrival the patient was unable to provide history, temperature 100.55F, WBC 7.8, and urinalysis highly suggestive of UTI. Creatinine was >6 and sodium was 148. CT head negative for acute findings. IV fluids, empiric antibiotics were started and he was admitted for acute encephalopathy due to UTI.    Assessment & Plan: Principal Problem:   UTI (urinary tract infection) Active Problems:   Essential hypertension   CAD (coronary artery disease)   S/P bilateral BKA (below knee amputation) (HCC)   Acute renal failure superimposed on stage 4 chronic kidney disease (HCC)   Type II diabetes mellitus with renal manifestations (HCC)   Acute metabolic encephalopathy   Sepsis (HCC)   Hypernatremia   Chronic CHF (congestive heart failure) (HCC)  Sepsis due to GNR UTI: Sepsis physiology resolving. - Continue empiric Tx with ceftriaxone - Monitoring urine and blood culture data  Acute metabolic encephalopathy: Possibly due to UTI, dehydration, renal failure, urinary retention primarily. Doubt hypernatremia. Ammonia was checked and is mildly elevated, though LFTs are not elevated.  - Antibiotics - Delirium precautions - Unable to give lactulose due to pt's mental status. Will consider enema if not improving with treatment. - R/o constipation as cause, give suppository today and tomorrow.   Acute renal failure on CKD stage IV: SCr 6.26 from baseline of 2-2.5, followed by Dr. Arrie Aran as outpatient s/p AV graft in left arm on 12/31/2017. Suspect prerenal azotemia and possible ATN from sepsis, also possibly postrenal. No indication for HD currently. - Check renal U/S -  Continue IV fluids and hold diuretic, appears euvolemic - Avoid nephrotoxic agents, stop vanc/zosyn - Will consult palliative care per PCP's wishes to continue goals of care discussions. Pt currently full code. - Holding PO medications pending mental status improvement due to current aspiration risk. Would restart calcitriol  Urinary retention: Unknown chronicity, could be cause of UTI and contribution to renal failure. Bladder scan 4/3 showed 673cc.  - Insert foley catheter, monitor strict I/O - Renal U/S as above  Hypernatremia: Mild, likely due to dehydration.  - Continue hypotonic saline and monitor in AM.   IDT2DM:  - Decrease lantus 50u > 30u, continue SSI - Hold tradjenta  CAD, chronic diastolic CHF: No evidence of hypervolemia. BNP 91 - Continue ASA, holding statin for now - Holding lasix, monitor I/O and daily weights   HTN: Currently soft BP - Holding home medications including norvasc, metoprolol, clonidine  DVT prophylaxis: Heparin Code Status: Full (presumed) Family Communication: None at bedside Disposition Plan: Uncertain  Consultants:   Palliative care team  Procedures:   Foley 4/3  Antimicrobials:  Vancomycin, zosyn 4/2  Ceftriaxone 4/2   Subjective: Unable to obtain from patient due to encephalopathy. RN reports   Objective: Vitals:   01/22/18 0230 01/22/18 0336 01/22/18 0344 01/22/18 1252  BP: 123/60 120/68  (!) 112/46  Pulse: 87 87  79  Resp: (!) 21 16  18   Temp:  97.7 F (36.5 C)  97.9 F (36.6 C)  TempSrc:  Oral  Oral  SpO2: 98% 99%  99%  Weight:   64.5 kg (142 lb 3.2 oz)     Intake/Output Summary (Last 24  hours) at 01/22/2018 1459 Last data filed at 01/22/2018 1439 Gross per 24 hour  Intake 2206.67 ml  Output 676 ml  Net 1530.67 ml   Filed Weights   01/21/18 2000 01/22/18 0344  Weight: 74.8 kg (165 lb) 64.5 kg (142 lb 3.2 oz)    Gen: Frail 77 y.o. male in no distress  Pulm: Non-labored breathing room air. Clear to auscultation  bilaterally.  CV: Regular rate and rhythm. No murmur, rub, or gallop. No JVD, no pedal edema. GI: Abdomen soft, non-tender, non-distended, with normoactive bowel sounds. No organomegaly or masses felt. Ext: Warm, no deformities. Left AVG +thrill without induration, fluctuance, or discharge. Skin: As above, otherwise no rashes or ulcers Neuro: Alert, oriented only to self. No focal neurological deficits, though pt unable to fully cooperate. Psych: UTD  Data Reviewed: I have personally reviewed following labs and imaging studies  CBC: Recent Labs  Lab 01/21/18 1858 01/21/18 1902 01/22/18 0325  WBC  --  7.8 9.5  NEUTROABS  --  5.6  --   HGB 12.2* 11.0* 10.2*  HCT 36.0* 35.3* 32.6*  MCV  --  83.6 84.0  PLT  --  219 205   Basic Metabolic Panel: Recent Labs  Lab 01/21/18 1858 01/21/18 1902 01/22/18 0325  NA 151* 148* 146*  K 4.7 4.7 4.4  CL 117* 113* 112*  CO2  --  19* 17*  GLUCOSE 163* 163* 199*  BUN 80* 99* 95*  CREATININE 6.40* 6.26* 6.42*  CALCIUM  --  9.6 8.8*   GFR: CrCl cannot be calculated (Unknown ideal weight.). Liver Function Tests: Recent Labs  Lab 01/21/18 1902  AST 39  ALT 36  ALKPHOS 82  BILITOT 0.9  PROT 8.5*  ALBUMIN 3.1*   No results for input(s): LIPASE, AMYLASE in the last 168 hours. Recent Labs  Lab 01/21/18 1913  AMMONIA 60*   Coagulation Profile: No results for input(s): INR, PROTIME in the last 168 hours. Cardiac Enzymes: No results for input(s): CKTOTAL, CKMB, CKMBINDEX, TROPONINI in the last 168 hours. BNP (last 3 results) No results for input(s): PROBNP in the last 8760 hours. HbA1C: No results for input(s): HGBA1C in the last 72 hours. CBG: Recent Labs  Lab 01/22/18 0134 01/22/18 1135  GLUCAP 144* 196*   Lipid Profile: No results for input(s): CHOL, HDL, LDLCALC, TRIG, CHOLHDL, LDLDIRECT in the last 72 hours. Thyroid Function Tests: No results for input(s): TSH, T4TOTAL, FREET4, T3FREE, THYROIDAB in the last 72  hours. Anemia Panel: No results for input(s): VITAMINB12, FOLATE, FERRITIN, TIBC, IRON, RETICCTPCT in the last 72 hours. Urine analysis:    Component Value Date/Time   COLORURINE AMBER (A) 01/21/2018 2139   APPEARANCEUR CLOUDY (A) 01/21/2018 2139   LABSPEC 1.014 01/21/2018 2139   PHURINE 5.0 01/21/2018 2139   GLUCOSEU NEGATIVE 01/21/2018 2139   HGBUR MODERATE (A) 01/21/2018 2139   BILIRUBINUR NEGATIVE 01/21/2018 2139   KETONESUR 5 (A) 01/21/2018 2139   PROTEINUR 100 (A) 01/21/2018 2139   UROBILINOGEN 0.2 11/29/2014 1524   NITRITE NEGATIVE 01/21/2018 2139   LEUKOCYTESUR LARGE (A) 01/21/2018 2139   Recent Results (from the past 240 hour(s))  Blood culture (routine x 2)     Status: None (Preliminary result)   Collection Time: 01/21/18  6:30 PM  Result Value Ref Range Status   Specimen Description BLOOD RIGHT HAND  Final   Special Requests IN PEDIATRIC BOTTLE Blood Culture adequate volume  Final   Culture   Final    NO GROWTH < 24  HOURS Performed at [facility], [address] [place], [place] [address]    Report Status PENDING  Incomplete  Blood culture (routine x 2)     Status: None (Preliminary result)   Collection Time: 01/21/18  6:40 PM  Result Value Ref Range Status   Specimen Description BLOOD RIGHT HAND  Final   Special Requests IN PEDIATRIC BOTTLE Blood Culture adequate volume  Final   Culture   Final    NO GROWTH < 24 HOURS Performed at [facility], [address] [place], [place] [address]    Report Status PENDING  Incomplete  Urine culture     Status: Abnormal (Preliminary result)   Collection Time: 01/21/18  9:39 PM  Result Value Ref Range Status   Specimen Description URINE, CATHETERIZED  Final   Special Requests NONE  Final   Culture (A)  Final    >=100,000 COLONIES/mL GRAM NEGATIVE RODS IDENTIFICATION AND SUSCEPTIBILITIES TO FOLLOW Performed at [facility], [address] [place], [place] [address]    Report Status PENDING   Incomplete      Radiology Studies: Dg Chest 2 View  Result Date: 01/21/2018 CLINICAL DATA:  Altered mental status. EXAM: CHEST - 2 VIEW COMPARISON:  Chest x-ray dated August 25, 2015. FINDINGS: The heart size and mediastinal contours are within normal limits. Normal pulmonary vascularity. Unchanged elevated left hemidiaphragm with left lower lobe atelectasis. The right lung is clear. No focal consolidation, pleural effusion, or pneumothorax. No acute osseous abnormality. IMPRESSION: No active cardiopulmonary disease. Electronically Signed   By: Obie DredgeWilliam T Derry M.D.   On: 01/21/2018 20:56   Ct Head Wo Contrast  Result Date: 01/21/2018 CLINICAL DATA:  Altered mental status and combativeness for 2 weeks. EXAM: CT HEAD WITHOUT CONTRAST TECHNIQUE: Contiguous axial images were obtained from the base of the skull through the vertex without intravenous contrast. COMPARISON:  None. FINDINGS: Brain: No evidence of acute infarction, hemorrhage, hydrocephalus, extra-axial collection or mass lesion/mass effect. Cortical atrophy is noted. Vascular: Extensive atherosclerosis. Skull: Intact. Sinuses/Orbits: No acute abnormality. The patient is status post bilateral lens extraction. Other: None. IMPRESSION: No acute abnormality. Atrophy. Extensive atherosclerosis. Electronically Signed   By: Drusilla Kannerhomas  Dalessio M.D.   On: 01/21/2018 19:27    Scheduled Meds: . aspirin  150 mg Rectal Daily  . heparin  5,000 Units Subcutaneous Q8H  . insulin aspart  0-9 Units Subcutaneous TID WC  . insulin glargine  30 Units Subcutaneous QHS   Continuous Infusions: . cefTRIAXone (ROCEPHIN)  IV Stopped (01/22/18 0122)  . dextrose 5 % and 0.45% NaCl 100 mL/hr at 01/22/18 1231     LOS: 1 day   Time spent: 35 minutes.  Marc Junkeryan Janeth Terry, MD [facility] Pager [phone]  If 7PM-7AM, please contact night-coverage [email or web address] Password [email or web address] 01/22/2018, 2:59 PM

## 2018-01-22 NOTE — Progress Notes (Signed)
Dr Karle Plumbergruntz in to see pt, pt not coopertative with assessment, attempt to feed pt but ate one bite and refused per charge nurse. MD asked if any family in yet and advised no but will page him if they do. He will try to contact later today

## 2018-01-22 NOTE — Progress Notes (Signed)
Pt sleeps, awakens if call his name or try to assess or reposition, pt would not allow to do neuro assessment as pt will not keep eyes open or cooperate, pt has mittens in placed as pt pulling at lines, tele leads had come off and reapplied, sr on telemetry, will continue to try to orient and assist with reposition but pt pushes away, still has not been verbal with this nurse

## 2018-01-23 DIAGNOSIS — E440 Moderate protein-calorie malnutrition: Secondary | ICD-10-CM

## 2018-01-23 LAB — URINE CULTURE: Culture: >=100000 — AB

## 2018-01-23 LAB — BASIC METABOLIC PANEL
Anion gap: 9 (ref 5–15)
Anion gap: 11 (ref 5–15)
BUN: 69 mg/dL — ABNORMAL HIGH (ref 6–20)
BUN: 90 mg/dL — AB (ref 6–20)
CALCIUM: 6.5 mg/dL — AB (ref 8.9–10.3)
CHLORIDE: 116 mmol/L — AB (ref 101–111)
CO2: 17 mmol/L — ABNORMAL LOW (ref 22–32)
CO2: 20 mmol/L — ABNORMAL LOW (ref 22–32)
CREATININE: 3.76 mg/dL — AB (ref 0.61–1.24)
Calcium: 8.7 mg/dL — ABNORMAL LOW (ref 8.9–10.3)
Chloride: 106 mmol/L (ref 101–111)
Creatinine, Ser: 4.58 mg/dL — ABNORMAL HIGH (ref 0.61–1.24)
GFR calc Af Amer: 16 mL/min — ABNORMAL LOW (ref >60)
GFR calc Af Amer: 13 mL/min — ABNORMAL LOW (ref >60)
GFR calc non Af Amer: 11 mL/min — ABNORMAL LOW (ref >60)
GFR, EST NON AFRICAN AMERICAN: 14 mL/min — AB (ref >60)
GLUCOSE: 157 mg/dL — AB (ref 65–99)
Glucose, Bld: 1230 mg/dL (ref 65–99)
POTASSIUM: 3.7 mmol/L (ref 3.5–5.1)
Potassium: 2.6 mmol/L — CL (ref 3.5–5.1)
SODIUM: 132 mmol/L — AB (ref 135–145)
Sodium: 147 mmol/L — ABNORMAL HIGH (ref 135–145)

## 2018-01-23 LAB — GLUCOSE, CAPILLARY
GLUCOSE-CAPILLARY: 102 mg/dL — AB (ref 65–99)
GLUCOSE-CAPILLARY: 152 mg/dL — AB (ref 65–99)
Glucose-Capillary: 142 mg/dL — ABNORMAL HIGH (ref 65–99)
Glucose-Capillary: 138 mg/dL — ABNORMAL HIGH (ref 65–99)
Glucose-Capillary: 107 mg/dL — ABNORMAL HIGH (ref 65–99)

## 2018-01-23 LAB — UREA NITROGEN, URINE: Urea Nitrogen, Ur: 192 mg/dL

## 2018-01-23 MED ORDER — INSULIN GLARGINE 100 UNIT/ML ~~LOC~~ SOLN
10.0000 [IU] | Freq: Every day | SUBCUTANEOUS | Status: DC
  Administered 2018-01-23: 10 [IU] via SUBCUTANEOUS
  Filled 2018-01-23 (×3): qty 0.1

## 2018-01-23 MED ORDER — FOSFOMYCIN TROMETHAMINE 3 G PO PACK
3.0000 g | PACK | ORAL | Status: DC
  Filled 2018-01-23: qty 3

## 2018-01-23 MED ORDER — RESOURCE THICKENUP CLEAR PO POWD
ORAL | Status: DC | PRN
  Filled 2018-01-23: qty 125

## 2018-01-23 NOTE — Consult Note (Signed)
   [facility] CM [facility] Consult   01/23/2018  Clearnce SorrelJimmy R Bolding 04/03/1941 [id number]   Patient screened for potential [facility] Care Management services.      Patient evaluated for [facility] Care Management services.  Patient is not currently a beneficiary of the attributed ACO Registry in the [facility].  His Primary Care Provider, Molinda BailiffErnest Smith, PA is not a [facility] primary care provider or is not [facility] affiliated.   This patient is Not eligible for [facility] Care [facility] Services.   Reason:  Not a beneficiary currently attributed to one of the [facility] ACO Registry populations.  Membership roster was used to verify non- eligible status.  01/24/18 0930 Confirmed with inpatient CSW, Maralyn SagoSarah that the patient is a [facility].   For questions contact:   Charlesetta ShanksVictoria Kendalynn Wideman, RN BSN CCM [facility] Liaison  [phone] business mobile phone Toll free office [phone]

## 2018-01-23 NOTE — Progress Notes (Signed)
Pt aggressive with staff, dont want to be turned or changed, refused to be fed, offered meal tray but refused it.

## 2018-01-23 NOTE — Progress Notes (Signed)
Palliative:  Family did not call back to meet with me. Mr. Jon BillingsMorrison is much improved from yesterday but remains a little confused. Palliative will follow and hopefully be able to discuss with patient himself his wishes and GOC.   No charge  Yong ChannelAlicia Trace Wirick, NP Palliative Medicine Team Pager # [phone] (M-F 8a-5p) Team Phone # [phone] (Nights/Weekends)

## 2018-01-23 NOTE — Evaluation (Addendum)
Physical Therapy Evaluation & Discharge Patient Details Name: Marc Wheeler MRN: [id number] DOB: 1941/02/19 Today's Date: 01/23/2018   History of Present Illness  Pt is a 77 y.o. male admitted from SNF on 01/21/18 with AMS for several days; positive for UTI. CXR negative. Head CT negative for acute intracranial abnormalities. PMH includes bilateral BKAs, CHF, CKD, CAD, DM, diabetic retinopathy, HTN, LUE AV fistula placement (12/31/17).     Clinical Impression  Pt presents with an overall decrease in functional mobility secondary to above. Pt alert, but not oriented; poor historian. Per chart, admitted from SNF where dependent for care. Today, required totalA (+2) for bed mobility and repositioning. Pt has no acute PT needs at this time. Acute PT will sign off and defer treatment/care back to SNF. Recommend mechanical lift for OOB transfers.    Follow Up Recommendations SNF;Supervision/Assistance - 24 hour    Equipment Recommendations  None recommended by PT    Recommendations for Other Services       Precautions / Restrictions Precautions Precautions: Fall Precaution Comments: Bilateral BKAs Restrictions Weight Bearing Restrictions: No      Mobility  Bed Mobility Overal bed mobility: Needs Assistance Bed Mobility: Rolling Rolling: Total assist         General bed mobility comments: TotalA (+2) for rolling and repositioning in bed; repositioned with pillows to encourage midline alignment, as pt leans towards L-side of bed  Transfers                 General transfer comment: NT  Ambulation/Gait                Stairs            Wheelchair Mobility    Modified Rankin (Stroke Patients Only)       Balance                                             Pertinent Vitals/Pain Pain Assessment: No/denies pain    Home Living Family/patient expects to be discharged to:: Skilled nursing facility                      Prior  Function Level of Independence: Needs assistance         Comments: Pt poor historian. Per chart, dependent for mobility and ADLs at SNF     Hand Dominance        Extremity/Trunk Assessment   Upper Extremity Assessment Upper Extremity Assessment: Generalized weakness    Lower Extremity Assessment Lower Extremity Assessment: Generalized weakness;RLE deficits/detail;LLE deficits/detail RLE Deficits / Details: R BKA LLE Deficits / Details: L BKA       Communication   Communication: Expressive difficulties  Cognition Arousal/Alertness: Awake/alert Behavior During Therapy: WFL for tasks assessed/performed Overall Cognitive Status: History of cognitive impairments - at baseline                                 General Comments: Alert, but not oriented. When asked if I can call him Dent, said, "That's my name"      General Comments      Exercises     Assessment/Plan    PT Assessment All further PT needs can be met in the next venue of care  PT Problem List Decreased strength;Decreased activity tolerance;Decreased  balance;Decreased mobility;Decreased cognition;Decreased knowledge of use of DME       PT Treatment Interventions      PT Goals (Current goals can be found in the Care Plan section)  Acute Rehab PT Goals PT Goal Formulation: Patient unable to participate in goal setting    Frequency     Barriers to discharge        Co-evaluation               AM-PAC PT "6 Clicks" Daily Activity  Outcome Measure Difficulty turning over in bed (including adjusting bedclothes, sheets and blankets)?: Unable Difficulty moving from lying on back to sitting on the side of the bed? : Unable Difficulty sitting down on and standing up from a chair with arms (e.g., wheelchair, bedside commode, etc,.)?: Unable Help needed moving to and from a bed to chair (including a wheelchair)?: Total Help needed walking in hospital room?: Total Help needed climbing  3-5 steps with a railing? : Total 6 Click Score: 6    End of Session   Activity Tolerance: Other (comment)(Limited by confusion) Patient left: in bed;with call bell/phone within reach Nurse Communication: Mobility status;Need for lift equipment PT Visit Diagnosis: Other abnormalities of gait and mobility (R26.89)    Time: 4098-1191 PT Time Calculation (min) (ACUTE ONLY): 12 min   Charges:   PT Evaluation $PT Eval Moderate Complexity: 1 Mod     PT G Codes:       Mabeline Caras, PT, DPT Acute Rehab Services  Pager: [place] 01/23/2018, 10:02 AM

## 2018-01-23 NOTE — Progress Notes (Addendum)
S:More awake today but still disoriented/delirious O:BP 118/67 (BP Location: Right Arm)   Pulse 82   Temp (!) 97.5 F (36.4 C) (Oral)   Resp 14   Wt 64.5 kg (142 lb 3.2 oz) Comment: bed  SpO2 100%   BMI 39.99 kg/m   Intake/Output Summary (Last 24 hours) at 01/23/2018 1106 Last data filed at 01/23/2018 0603 Gross per 24 hour  Intake 1951.67 ml  Output 1876 ml  Net 75.67 ml   Intake/Output: I/O last 3 completed shifts: In: 3506.7 [I.V.:2256.7; IV Piggyback:1250] Out: 1876 [Urine:1876]  Intake/Output this shift:  No intake/output data recorded. Weight change:  Gen: lethargic but arousable CVS: no rub Resp: cta Abd: benign Ext: bilateral BKA's, no edema, left forearm AVG +T/B  Recent Labs  Lab 01/21/18 1858 01/21/18 1902 01/22/18 0325 01/23/18 0459 01/23/18 0744  NA 151* 148* 146* 132* 147*  K 4.7 4.7 4.4 2.6* 3.7  CL 117* 113* 112* 106 116*  CO2  --  19* 17* 17* 20*  GLUCOSE 163* 163* 199* 1,230* 157*  BUN 80* 99* 95* 69* 90*  CREATININE 6.40* 6.26* 6.42* 3.76* 4.58*  ALBUMIN  --  3.1*  --   --   --   CALCIUM  --  9.6 8.8* 6.5* 8.7*  AST  --  39  --   --   --   ALT  --  36  --   --   --    Liver Function Tests: Recent Labs  Lab 01/21/18 1902  AST 39  ALT 36  ALKPHOS 82  BILITOT 0.9  PROT 8.5*  ALBUMIN 3.1*   No results for input(s): LIPASE, AMYLASE in the last 168 hours. Recent Labs  Lab 01/21/18 1913  AMMONIA 60*   CBC: Recent Labs  Lab 01/21/18 1858 01/21/18 1902 01/22/18 0325  WBC  --  7.8 9.5  NEUTROABS  --  5.6  --   HGB 12.2* 11.0* 10.2*  HCT 36.0* 35.3* 32.6*  MCV  --  83.6 84.0  PLT  --  219 205   Cardiac Enzymes: No results for input(s): CKTOTAL, CKMB, CKMBINDEX, TROPONINI in the last 168 hours. CBG: Recent Labs  Lab 01/22/18 1135 01/22/18 1648 01/22/18 2052 01/23/18 0702 01/23/18 0817  GLUCAP 196* 101* 152* 142* 138*    Iron Studies: No results for input(s): IRON, TIBC, TRANSFERRIN, FERRITIN in the last 72  hours. Studies/Results: Dg Chest 2 View  Result Date: 01/21/2018 CLINICAL DATA:  Altered mental status. EXAM: CHEST - 2 VIEW COMPARISON:  Chest x-ray dated August 25, 2015. FINDINGS: The heart size and mediastinal contours are within normal limits. Normal pulmonary vascularity. Unchanged elevated left hemidiaphragm with left lower lobe atelectasis. The right lung is clear. No focal consolidation, pleural effusion, or pneumothorax. No acute osseous abnormality. IMPRESSION: No active cardiopulmonary disease. Electronically Signed   By: Obie DredgeWilliam T Derry M.D.   On: 01/21/2018 20:56   Ct Head Wo Contrast  Result Date: 01/21/2018 CLINICAL DATA:  Altered mental status and combativeness for 2 weeks. EXAM: CT HEAD WITHOUT CONTRAST TECHNIQUE: Contiguous axial images were obtained from the base of the skull through the vertex without intravenous contrast. COMPARISON:  None. FINDINGS: Brain: No evidence of acute infarction, hemorrhage, hydrocephalus, extra-axial collection or mass lesion/mass effect. Cortical atrophy is noted. Vascular: Extensive atherosclerosis. Skull: Intact. Sinuses/Orbits: No acute abnormality. The patient is status post bilateral lens extraction. Other: None. IMPRESSION: No acute abnormality. Atrophy. Extensive atherosclerosis. Electronically Signed   By: Drusilla Kannerhomas  Dalessio M.D.   On:  01/21/2018 19:27   US Renal  Result Date: 01/23/2018 CLINICAL DATA:  Acute renal failure. History of diabetes and hypertension. EXAM: RENAL / URINARY TRACT ULTRASOUND COMPLETE COMPARISON:  10/01/2013 FINDINGS: Right Kidney: Length: 8.9 cm. Diffusely increased parenchymal echotexture and mild parenchymal thinning. No hydronephrosis or solid mass identified. Lower pole is partially obscured by overlying bowel gas. Left Kidney: Not able to visualize the left kidney due to overlying bowel gas. Bladder: Bladder is moderately distended. Mild bladder wall thickening at 7.6 mm. No intraluminal filling defects. IMPRESSION: 1.  Changes consistent with chronic medical renal disease on the right kidney. No hydronephrosis. 2. Unable to visualize the left kidney due to overlying bowel gas. 3. Diffuse bladder wall thickening may indicate cystitis. Electronically Signed   By: Burman Nieves M.D.   On: 01/23/2018 01:29   . aspirin  150 mg Rectal Daily  . bisacodyl  10 mg Rectal Daily  . fosfomycin  3 g Oral Q72H  . heparin  5,000 Units Subcutaneous Q8H  . insulin aspart  0-9 Units Subcutaneous TID WC  . insulin glargine  30 Units Subcutaneous QHS    BMET    Component Value Date/Time   NA 147 (H) 01/23/2018 0744   NA 144 12/10/2016   K 3.7 01/23/2018 0744   CL 116 (H) 01/23/2018 0744   CO2 20 (L) 01/23/2018 0744   GLUCOSE 157 (H) 01/23/2018 0744   BUN 90 (H) 01/23/2018 0744   BUN 16 12/10/2016   CREATININE 4.58 (H) 01/23/2018 0744   CREATININE 2.29 (H) 11/29/2014 0600   CALCIUM 8.7 (L) 01/23/2018 0744   GFRNONAA 11 (L) 01/23/2018 0744   GFRAA 13 (L) 01/23/2018 0744   CBC    Component Value Date/Time   WBC 9.5 01/22/2018 0325   RBC 3.88 (L) 01/22/2018 0325   HGB 10.2 (L) 01/22/2018 0325   HCT 32.6 (L) 01/22/2018 0325   PLT 205 01/22/2018 0325   MCV 84.0 01/22/2018 0325   MCH 26.3 01/22/2018 0325   MCHC 31.3 01/22/2018 0325   RDW 17.0 (H) 01/22/2018 0325   LYMPHSABS 1.6 01/21/2018 1902   MONOABS 0.5 01/21/2018 1902   EOSABS 0.0 01/21/2018 1902   BASOSABS 0.0 01/21/2018 1902     Assessment/Plan: 1.  AKI/CKD stage 4- agree that he appears volume depleted and concur with IVF's and holding diuretics.  He is a poor dialysis candidate so I fully support Palliative care consult to help set goals/limits of care.  No indication for dialysis at this time.  Agree with holding vancomycin. 2. Klebsiella UTI- Currently on rocephin 3. Altered mental status/acute metabolic encephalopathy - likely due to UTI and AKI. 4. Bladder outlet obstruction/urinary retention- improved after foley catheter inserted. 5. PVD-  s/p bilateral BKA's 6. hypernatremia- improving with IVF's may need to increase rate of 1/2 NS until he starts taking po 7. DM type 2- per primary 8. CAD- stable 9. Chronic diastolic CHF- volume depleted as above.  Lasix on hold 10. HTN- low bp off of meds due to infection 11. Anemia of CKd stage 3. Was started on aranesp sq every 4 weeks in March, received dose on 01/10/18 12. Disposition- pt is not yet on dialysis but agree with palliative care consult as he is a poor long term dialysis candidate given his advanced age, multiple co-morbidities, and poor functional status.   Irena Cords, MD [employer] [phone]

## 2018-01-23 NOTE — Progress Notes (Signed)
Pt unable to void since the last in and out cath. Bladder was scanned showing 349 ml. Foley catheter was place aseptically for acute urinary retention as ordered. Will continue to monitor pt.

## 2018-01-23 NOTE — Evaluation (Signed)
Clinical/Bedside Swallow Evaluation Patient Details  Name: LORENA CLEARMAN MRN: [id number] Date of Birth: [birth year]  Today's Date: 01/23/2018 Time: SLP Start Time (ACUTE ONLY): 1445 SLP Stop Time (ACUTE ONLY): 1500 SLP Time Calculation (min) (ACUTE ONLY): 15 min  Past Medical History:  Past Medical History:  Diagnosis Date  . Anemia   . Angina pectoris (HCC)   . CHF (congestive heart failure) (HCC)   . Chronic kidney disease   . Coronary artery disease   . Depression   . Diabetes mellitus without complication (HCC)   . Diabetic retinopathy (HCC)   . Edema   . Hyperlipemia   . Hyperlipidemia   . Hyperlipidemia LDL goal <100 07/16/2014  . Hypertension   . Muscle weakness   . Neuropathy   . Secondary hyperaldosteronism (HCC)   . Vascular headache    Past Surgical History:  Past Surgical History:  Procedure Laterality Date  . APPENDECTOMY    . AV FISTULA PLACEMENT Left 12/31/2017   Procedure: INSERTION OF ARTERIOVENOUS (AV) GORE-TEX GRAFT ARM;  Surgeon: Chuck Hint, MD;  Location: [facility] OR;  Service: Vascular;  Laterality: Left;  . LEG AMPUTATION BELOW KNEE Bilateral   . right retinal detachment repair  1 22 14    HPI:  Pt is a 77 y.o. male admitted from SNF on 01/21/18 with AMS for several days; positive for UTI. CXR negative. Head CT negative for acute intracranial abnormalities. PMH includes bilateral BKAs, CHF, CKD, CAD, DM, diabetic retinopathy, HTN, LUE AV fistula placement (12/31/17).  CXR on 4/2 negative.    Assessment / Plan / Recommendation Clinical Impression  Pt demonstrates immediate signs of aspiration with self fed cup and straw sips of thin liquids, suspect impact of mental stasis on timing of swallow. When allowed to consume nectar thick liquids with therapist led small sips or larger consecutive sips there is no further sign of sensed aspiration. Pt able to masticate regualr solids with gums, but process is prolonged. Recommend initiating dys 2 (finely  chopped) solids with nectar thick liquids. Expect potential for upgrade as mental status improves though objective test may be warranted if coughing with thin liquid trials persists. Discussed with RN.  SLP Visit Diagnosis: Dysphagia, oropharyngeal phase (R13.12)    Aspiration Risk  Moderate aspiration risk    Diet Recommendation Dysphagia 2 (Fine chop);Nectar-thick liquid   Liquid Administration via: Cup;Straw Medication Administration: Crushed with puree Supervision: Staff to assist with self feeding Compensations: Slow rate;Small sips/bites;Minimize environmental distractions Postural Changes: Seated upright at 90 degrees    Other  Recommendations Oral Care Recommendations: Oral care BID   Follow up Recommendations Skilled Nursing facility      Frequency and Duration min 2x/week  2 weeks       Prognosis Prognosis for Safe Diet Advancement: Good      Swallow Study   General HPI: Pt is a 77 y.o. male admitted from SNF on 01/21/18 with AMS for several days; positive for UTI. CXR negative. Head CT negative for acute intracranial abnormalities. PMH includes bilateral BKAs, CHF, CKD, CAD, DM, diabetic retinopathy, HTN, LUE AV fistula placement (12/31/17).  CXR on 4/2 negative.  Type of Study: Bedside Swallow Evaluation Diet Prior to this Study: NPO Temperature Spikes Noted: No Respiratory Status: Room air History of Recent Intubation: No Behavior/Cognition: Alert;Distractible;Confused;Doesn't follow directions Oral Cavity Assessment: Within Functional Limits Oral Care Completed by SLP: No Oral Cavity - Dentition: Edentulous Self-Feeding Abilities: Able to feed self Patient Positioning: Upright in bed Baseline Vocal Quality: Normal Volitional  Cough: Cognitively unable to elicit Volitional Swallow: Unable to elicit    Oral/Motor/Sensory Function Overall Oral Motor/Sensory Function: Other (comment)(doesnt follow commands, appears WNL)   Ice Chips     Thin Liquid Thin Liquid:  Impaired Presentation: Cup;Straw;Self Fed Pharyngeal  Phase Impairments: Cough - Immediate    Nectar Thick Nectar Thick Liquid: Within functional limits Presentation: Cup;Straw;Self Fed   Honey Thick Honey Thick Liquid: Not tested   Puree Puree: Within functional limits Presentation: Spoon   Solid   GO   Solid: Impaired Oral Phase Impairments: Impaired mastication Oral Phase Functional Implications: Impaired mastication;Prolonged oral transit       Harlon DittyBonnie Lurine Imel, MA CCC-SLP [phone]  Claudine MoutonDeBlois, Decie Verne Caroline 01/23/2018,3:09 PM

## 2018-01-23 NOTE — Progress Notes (Signed)
Received a call from the Lab dept relaying the blood glucose was 1230 and potassium 2.6. Rechecked blood sugar using accu check 142. Paged Dr. Karle PlumberGruntz and will re draw blood per Dr. Jarvis NewcomerGrunz. Will endorsed it to the morning RN.

## 2018-01-23 NOTE — Progress Notes (Signed)
PROGRESS NOTE  Marc Wheeler  ZOX:[id number] DOB: 1941/08/08 DOA: 01/21/2018 PCP: Molinda Bailiff, PA   Brief Narrative: Marc Wheeler is a 77 y.o. 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 On arrival the patient was unable to provide history, temperature 100.59F, WBC 7.8, and urinalysis highly suggestive of UTI. Creatinine was >6 and sodium was 148. CT head negative for acute findings. IV fluids, empiric antibiotics were started and he was admitted for acute encephalopathy due to UTI.    Assessment & Plan: Principal Problem:   UTI (urinary tract infection) Active Problems:   Essential hypertension   CAD (coronary artery disease)   S/P bilateral BKA (below knee amputation) (HCC)   Acute renal failure superimposed on stage 4 chronic kidney disease (HCC)   Type II diabetes mellitus with renal manifestations (HCC)   Acute metabolic encephalopathy   Sepsis (HCC)   Hypernatremia   Chronic CHF (congestive heart failure) (HCC)   Malnutrition of moderate degree  Sepsis due to ESBL Klebsiella UTI: Sepsis physiology improved. Significant cystitis on U/S. - Transition to fosfomycin (q72h x2) if he passes swallow study, if not would require penem.  - Monitoring blood culture data  Acute metabolic encephalopathy: Possibly due to UTI, dehydration, renal failure, urinary retention primarily. Doubt hypernatremia. Ammonia was checked and is mildly elevated, though LFTs are not elevated and no treatment has been started. - Antibiotics - Delirium precautions - SLP evaluation prior to restarting po meds and feeding. - R/o constipation as cause, give suppository today and tomorrow.   Acute renal failure on CKD stage IV: SCr 6.26 from baseline of 2-2.5, followed by Dr. Arrie Aran as outpatient s/p AV graft in left arm on 12/31/2017. Suspect prerenal azotemia and  possible ATN from sepsis, also possibly postrenal. No indication for HD currently. Renal U/S no hydro on right with medical renal disease features. Left kidney not seen.  - Initial lab draw this morning was spurious, suspect due to drawing on arm where D5 hypotonic saline was running. Repeat values appear more accurate, improving renal function.  - Continue IV fluids and hold diuretic. Will increase rate due to continued NPO status. - Avoid nephrotoxic agents, stopped vanc/zosyn - Palliative care consulted, family meeting pending.   Pt currently full code.  Urinary retention: Unknown chronicity, could be cause of UTI and contribution to renal failure. Bladder scan 4/3 showed 673cc.  - Insert foley catheter, monitor strict I/O. Will pursue voiding trial prior to discharge.  Hypernatremia: Mild, stable, likely due to dehydration.  - Continue hypotonic saline and monitor in AM.   IDT2DM:  - Decrease lantus 50u > 10u, continue SSI - Hold tradjenta  CAD, chronic diastolic CHF: No evidence of hypervolemia. BNP 91 - Continue ASA, holding statin for now - Holding lasix, monitor I/O and daily weights   HTN: Currently soft BP - Holding home medications including norvasc, metoprolol, clonidine  DVT prophylaxis: Heparin Code Status: Full (presumed) Family Communication: Son at bedside yesterday PM, discussed with him for >15 min.  Disposition Plan: Uncertain, from SNF.  Consultants:   Palliative care team  Nephrology  Procedures:   Foley 4/3  Antimicrobials:  Vancomycin, zosyn 4/2  Ceftriaxone 4/2   Subjective: Pt more conversant today, but still encephalopathic, not able to follow conversation but more engaged.   Objective: Vitals:   01/22/18 1939 01/22/18 2054 01/23/18 0600  01/23/18 1200  BP: 126/61  118/67 (!) 145/75  Pulse: 76  82 80  Resp: 14     Temp:   (!) 97.5 F (36.4 C)   TempSrc:   Oral   SpO2:  100%  100%  Weight:        Intake/Output Summary (Last 24  hours) at 01/23/2018 1501 Last data filed at 01/23/2018 0603 Gross per 24 hour  Intake 1300 ml  Output 1200 ml  Net 100 ml   Filed Weights   01/21/18 2000 01/22/18 0344  Weight: 74.8 kg (165 lb) 64.5 kg (142 lb 3.2 oz)    Gen: Frail 77 y.o. male in no distress  Pulm: Non-labored breathing room air. Clear to auscultation bilaterally.  CV: Regular rate and rhythm. No murmur, rub, or gallop. No JVD, no pedal edema. GI: Abdomen soft, non-tender, non-distended, with normoactive bowel sounds. No organomegaly or masses felt. Ext: Warm, no deformities. Left AVG +thrill without induration, fluctuance, erythema or discharge. Skin: As above, otherwise no rashes or ulcers Neuro: Alert, oriented only to self, but more engaged. No focal neurological deficits, though pt unable to fully cooperate. Psych: UTD  Data Reviewed: I have personally reviewed following labs and imaging studies  CBC: Recent Labs  Lab 01/21/18 1858 01/21/18 1902 01/22/18 0325  WBC  --  7.8 9.5  NEUTROABS  --  5.6  --   HGB 12.2* 11.0* 10.2*  HCT 36.0* 35.3* 32.6*  MCV  --  83.6 84.0  PLT  --  219 205   Basic Metabolic Panel: Recent Labs  Lab 01/21/18 1858 01/21/18 1902 01/22/18 0325 01/23/18 0459 01/23/18 0744  NA 151* 148* 146* 132* 147*  K 4.7 4.7 4.4 2.6* 3.7  CL 117* 113* 112* 106 116*  CO2  --  19* 17* 17* 20*  GLUCOSE 163* 163* 199* 1,230* 157*  BUN 80* 99* 95* 69* 90*  CREATININE 6.40* 6.26* 6.42* 3.76* 4.58*  CALCIUM  --  9.6 8.8* 6.5* 8.7*   GFR: CrCl cannot be calculated (Unknown ideal weight.). Liver Function Tests: Recent Labs  Lab 01/21/18 1902  AST 39  ALT 36  ALKPHOS 82  BILITOT 0.9  PROT 8.5*  ALBUMIN 3.1*   No results for input(s): LIPASE, AMYLASE in the last 168 hours. Recent Labs  Lab 01/21/18 1913  AMMONIA 60*   Coagulation Profile: No results for input(s): INR, PROTIME in the last 168 hours. Cardiac Enzymes: No results for input(s): CKTOTAL, CKMB, CKMBINDEX, TROPONINI  in the last 168 hours. BNP (last 3 results) No results for input(s): PROBNP in the last 8760 hours. HbA1C: No results for input(s): HGBA1C in the last 72 hours. CBG: Recent Labs  Lab 01/22/18 1648 01/22/18 2052 01/23/18 0702 01/23/18 0817 01/23/18 1332  GLUCAP 101* 152* 142* 138* 107*   Lipid Profile: No results for input(s): CHOL, HDL, LDLCALC, TRIG, CHOLHDL, LDLDIRECT in the last 72 hours. Thyroid Function Tests: No results for input(s): TSH, T4TOTAL, FREET4, T3FREE, THYROIDAB in the last 72 hours. Anemia Panel: No results for input(s): VITAMINB12, FOLATE, FERRITIN, TIBC, IRON, RETICCTPCT in the last 72 hours. Urine analysis:    Component Value Date/Time   COLORURINE AMBER (A) 01/21/2018 2139   APPEARANCEUR CLOUDY (A) 01/21/2018 2139   LABSPEC 1.014 01/21/2018 2139   PHURINE 5.0 01/21/2018 2139   GLUCOSEU NEGATIVE 01/21/2018 2139   HGBUR MODERATE (A) 01/21/2018 2139   BILIRUBINUR NEGATIVE 01/21/2018 2139   KETONESUR 5 (A) 01/21/2018 2139   PROTEINUR 100 (A) 01/21/2018 2139  UROBILINOGEN 0.2 11/29/2014 1524   NITRITE NEGATIVE 01/21/2018 2139   LEUKOCYTESUR LARGE (A) 01/21/2018 2139   Recent Results (from the past 240 hour(s))  Blood culture (routine x 2)     Status: None (Preliminary result)   Collection Time: 01/21/18  6:30 PM  Result Value Ref Range Status   Specimen Description BLOOD RIGHT HAND  Final   Special Requests IN PEDIATRIC BOTTLE Blood Culture adequate volume  Final   Culture   Final    NO GROWTH 2 DAYS Performed at [facility], [address] [place], [place] [address]    Report Status PENDING  Incomplete  Blood culture (routine x 2)     Status: None (Preliminary result)   Collection Time: 01/21/18  6:40 PM  Result Value Ref Range Status   Specimen Description BLOOD RIGHT HAND  Final   Special Requests IN PEDIATRIC BOTTLE Blood Culture adequate volume  Final   Culture   Final    NO GROWTH 2 DAYS Performed at [facility], [address] [place], [place] [address]    Report Status PENDING  Incomplete  Urine culture     Status: Abnormal   Collection Time: 01/21/18  9:39 PM  Result Value Ref Range Status   Specimen Description URINE, CATHETERIZED  Final   Special Requests NONE  Final   Culture (A)  Final    >=100,000 COLONIES/mL KLEBSIELLA PNEUMONIAE Confirmed Extended Spectrum Beta-Lactamase Producer (ESBL).  In bloodstream infections from ESBL organisms, carbapenems are preferred over piperacillin/tazobactam. They are shown to have a lower risk of mortality. Performed at [facility], [address] [place], [place] [address]    Report Status 01/23/2018 FINAL  Final   Organism ID, Bacteria KLEBSIELLA PNEUMONIAE (A)  Final      Susceptibility   Klebsiella pneumoniae - MIC*    AMPICILLIN >=32 RESISTANT Resistant     CEFAZOLIN >=64 RESISTANT Resistant     CEFTRIAXONE >=64 RESISTANT Resistant     CIPROFLOXACIN >=4 RESISTANT Resistant     GENTAMICIN >=16 RESISTANT Resistant     IMIPENEM <=0.25 SENSITIVE Sensitive     NITROFURANTOIN 128 RESISTANT Resistant     TRIMETH/SULFA >=320 RESISTANT Resistant     AMPICILLIN/SULBACTAM >=32 RESISTANT Resistant     PIP/TAZO 64 INTERMEDIATE Intermediate     Extended ESBL POSITIVE Resistant     * >=100,000 COLONIES/mL KLEBSIELLA PNEUMONIAE      Radiology Studies: Dg Chest 2 View  Result Date: 01/21/2018 CLINICAL DATA:  Altered mental status. EXAM: CHEST - 2 VIEW COMPARISON:  Chest x-ray dated August 25, 2015. FINDINGS: The heart size and mediastinal contours are within normal limits. Normal pulmonary vascularity. Unchanged elevated left hemidiaphragm with left lower lobe atelectasis. The right lung is clear. No focal consolidation, pleural effusion, or pneumothorax. No acute osseous abnormality. IMPRESSION: No active cardiopulmonary disease. Electronically Signed   By: Obie DredgeWilliam T Derry M.D.   On: 01/21/2018 20:56   Ct Head Wo Contrast  Result Date: 01/21/2018 CLINICAL  DATA:  Altered mental status and combativeness for 2 weeks. EXAM: CT HEAD WITHOUT CONTRAST TECHNIQUE: Contiguous axial images were obtained from the base of the skull through the vertex without intravenous contrast. COMPARISON:  None. FINDINGS: Brain: No evidence of acute infarction, hemorrhage, hydrocephalus, extra-axial collection or mass lesion/mass effect. Cortical atrophy is noted. Vascular: Extensive atherosclerosis. Skull: Intact. Sinuses/Orbits: No acute abnormality. The patient is status post bilateral lens extraction. Other: None. IMPRESSION: No acute abnormality. Atrophy. Extensive atherosclerosis. Electronically  Signed   By: Drusilla Kanner M.D.   On: 01/21/2018 19:27   US Renal  Result Date: 01/23/2018 CLINICAL DATA:  Acute renal failure. History of diabetes and hypertension. EXAM: RENAL / URINARY TRACT ULTRASOUND COMPLETE COMPARISON:  10/01/2013 FINDINGS: Right Kidney: Length: 8.9 cm. Diffusely increased parenchymal echotexture and mild parenchymal thinning. No hydronephrosis or solid mass identified. Lower pole is partially obscured by overlying bowel gas. Left Kidney: Not able to visualize the left kidney due to overlying bowel gas. Bladder: Bladder is moderately distended. Mild bladder wall thickening at 7.6 mm. No intraluminal filling defects. IMPRESSION: 1. Changes consistent with chronic medical renal disease on the right kidney. No hydronephrosis. 2. Unable to visualize the left kidney due to overlying bowel gas. 3. Diffuse bladder wall thickening may indicate cystitis. Electronically Signed   By: Burman Nieves M.D.   On: 01/23/2018 01:29    Scheduled Meds: . aspirin  150 mg Rectal Daily  . bisacodyl  10 mg Rectal Daily  . fosfomycin  3 g Oral Q72H  . heparin  5,000 Units Subcutaneous Q8H  . insulin aspart  0-9 Units Subcutaneous TID WC  . insulin glargine  30 Units Subcutaneous QHS   Continuous Infusions: . dextrose 5 % and 0.45% NaCl 125 mL/hr at 01/23/18 1311     LOS: 2  days   Time spent: 25 minutes.  Hazeline Junker, MD [facility] Pager [phone]  If 7PM-7AM, please contact night-coverage [email or web address] Password [email or web address] 01/23/2018, 3:01 PM

## 2018-01-24 DIAGNOSIS — Z515 Encounter for palliative care: Secondary | ICD-10-CM

## 2018-01-24 DIAGNOSIS — Z66 Do not resuscitate: Secondary | ICD-10-CM

## 2018-01-24 DIAGNOSIS — E440 Moderate protein-calorie malnutrition: Secondary | ICD-10-CM

## 2018-01-24 LAB — GLUCOSE, CAPILLARY
GLUCOSE-CAPILLARY: 137 mg/dL — AB (ref 65–99)
GLUCOSE-CAPILLARY: 144 mg/dL — AB (ref 65–99)
GLUCOSE-CAPILLARY: 68 mg/dL (ref 65–99)
Glucose-Capillary: 117 mg/dL — ABNORMAL HIGH (ref 65–99)

## 2018-01-24 LAB — BASIC METABOLIC PANEL
Anion gap: 7 (ref 5–15)
BUN: 72 mg/dL — AB (ref 6–20)
CALCIUM: 8.6 mg/dL — AB (ref 8.9–10.3)
CHLORIDE: 117 mmol/L — AB (ref 101–111)
CO2: 19 mmol/L — ABNORMAL LOW (ref 22–32)
CREATININE: 3.38 mg/dL — AB (ref 0.61–1.24)
GFR calc non Af Amer: 16 mL/min — ABNORMAL LOW (ref >60)
GFR, EST AFRICAN AMERICAN: 19 mL/min — AB (ref >60)
Glucose, Bld: 220 mg/dL — ABNORMAL HIGH (ref 65–99)
Potassium: 4.1 mmol/L (ref 3.5–5.1)
SODIUM: 143 mmol/L (ref 135–145)

## 2018-01-24 MED ORDER — ASPIRIN 81 MG PO CHEW
81.0000 mg | CHEWABLE_TABLET | Freq: Every day | ORAL | Status: DC
  Administered 2018-01-24 – 2018-01-25 (×2): 81 mg via ORAL
  Filled 2018-01-24 (×3): qty 1

## 2018-01-24 MED ORDER — PRO-STAT SUGAR FREE PO LIQD
30.0000 mL | Freq: Two times a day (BID) | ORAL | Status: DC
  Administered 2018-01-24 – 2018-01-25 (×2): 30 mL via ORAL
  Filled 2018-01-24 (×2): qty 30

## 2018-01-24 MED ORDER — BOOST / RESOURCE BREEZE PO LIQD CUSTOM
1.0000 | Freq: Two times a day (BID) | ORAL | Status: DC
  Administered 2018-01-24 – 2018-01-25 (×3): 1 via ORAL

## 2018-01-24 MED ORDER — FOSFOMYCIN TROMETHAMINE 3 G PO PACK
3.0000 g | PACK | ORAL | Status: DC
  Administered 2018-01-24: 3 g via ORAL
  Filled 2018-01-24 (×2): qty 3

## 2018-01-24 NOTE — Progress Notes (Signed)
  Speech Language Pathology Treatment: Dysphagia  Patient Details Name: Marc Wheeler MRN: [id number] DOB: 04-13-1941 Today's Date: 01/24/2018 Time: 3300-7622 SLP Time Calculation (min) (ACUTE ONLY): 22 min  Assessment / Plan / Recommendation Clinical Impression  Pts mentation improving, now tolerating thin liquids from a straw with no signs of aspiration. Pts family denies any difficulty eating and drinking though his intake has been poor. Pt is also a particular eater. Will advance diet back to regular thin. No SLP f/u needed.   HPI HPI: Pt is a 77 y.o. male admitted from SNF on 01/21/18 with AMS for several days; positive for UTI. CXR negative. Head CT negative for acute intracranial abnormalities. PMH includes bilateral BKAs, CHF, CKD, CAD, DM, diabetic retinopathy, HTN, LUE AV fistula placement (12/31/17).  CXR on 4/2 negative.       SLP Plan  All goals met       Recommendations  Diet recommendations: Regular;Thin liquid Liquids provided via: Straw;Cup Medication Administration: Whole meds with liquid Supervision: Patient able to self feed                Oral Care Recommendations: Oral care BID SLP Visit Diagnosis: Dysphagia, oropharyngeal phase (R13.12) Plan: All goals met       GO               Herbie Baltimore, MA CCC-SLP [phone]  Lynann Beaver 01/24/2018, 12:08 PM

## 2018-01-24 NOTE — Consult Note (Addendum)
Consultation Note Date: 01/24/2018   Patient Name: Marc Wheeler  DOB: [birth year]  MRN: [id number]  Age / Sex: 77 y.o., male  PCP: Salome Arnt, [place] Referring Physician: Patrecia Pour, MD  Reason for Consultation: Establishing goals of care  HPI/Patient Profile: 77 y.o. male  with past medical history of brittle DM, CKD stage 4, PVD s/p bilateral BKA, recurrent UTI, CHF, CAD, secondary hyperaldosteronism who was admitted on 01/21/2018 with sepsis secondary to bladder outlet obstruction and UTI.  The UTI has been found to be highly resistant.  The patient's kidney function has declined significantly.  His severe encephalopathy is slowly resolving.  Clinical Assessment and Goals of Care:   I have reviewed medical records including EPIC notes, labs and imaging, received report from Drs.Bonner Puna and Coladonato, assessed the patient and then met at the bedside along with his son Sabian and his daughter D'etta  to discuss diagnosis prognosis, GOC, EOL wishes, disposition and options.  I introduced Palliative Medicine as specialized medical care for people living with serious illness. It focuses on providing relief from the symptoms and stress of a serious illness. The goal is to improve quality of life for both the patient and the family.  Patient confused, but awake and pleasant. 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  He has always been a very independent spirit - and needed to do things his own way.    We discussed his health conditions with focus on his brittle DM, his heart, kidneys and recurrent resistant UTIs.  D'etta and Kelten are very involved and have witnesses their father's decline.  As the majority of his adult children - D'etta and Garner are the default HCPOA.  We reviewed a MOST form and they made the following  selections:   DNR  Comfort measures only - Do not return to the hospital unless he can not be kept comfortable in his location  Determine use/limitations of antibiotics at the time the need arises  IVF if needed for a defined trial period   No feeding tube - ever  No Hemodialysis  They request that Hospice services be engaged and support him at the facility.   Assessment:  Encephalopathy improving but patient is still confused.  ESBL klebsiella UTI resistant to most antibiotics.  Patient with significant malnutrition, bed bound.   Primary Decision Maker:  NEXT OF KIN Majority of his 3 adult children.    SUMMARY OF RECOMMENDATIONS    Once medically optimized please return patient to [facility].  Code Status/Advance Care Planning: MOST form completed and the original and a copy placed in the hard chart  DNR  Comfort measures only - Do not return to the hospital unless he can not be kept comfortable in his location  Determine use/limitations of antibiotics at the time the need arises  IVF if needed for a defined trial period   No feeding tube -  ever  No Hemodialysis   Symptom Management:   Is it possible to liberalize his diet?  He likes to use his own salt shaker.   Prognosis:  Less than 6 months given stage 4-5 CKD (creatinine now 6, GFR 13-19) no dialysis in the future, severe PVD with bilateral BKA, brittle DM, secondary hyperaldosteronism, CHF/CAD.  Patient with very low PO intake and recurrent resistant UTIs.    Now opts for comfort measures and hospital avoidance.    Discharge Planning: [place] with Hospice      Primary Diagnoses: Present on Admission: . CAD (coronary artery disease) . Essential hypertension . Acute renal failure superimposed on stage 4 chronic kidney disease ([place]) . Type II diabetes mellitus with renal manifestations ([place]) . UTI (urinary tract infection) . Acute metabolic encephalopathy .  Sepsis ([place]) . Hypernatremia   I have reviewed the medical record, interviewed the patient and family, and examined the patient. The following aspects are pertinent.  Past Medical History:  Diagnosis Date  . Anemia   . Angina pectoris ([place])   . CHF (congestive heart failure) ([place])   . Chronic kidney disease   . Coronary artery disease   . Depression   . Diabetes mellitus without complication ([place])   . Diabetic retinopathy ([place])   . Edema   . Hyperlipemia   . Hyperlipidemia   . Hyperlipidemia LDL goal <100 07/16/2014  . Hypertension   . Muscle weakness   . Neuropathy   . Secondary hyperaldosteronism ([place])   . Vascular headache    Social History   Socioeconomic History  . Marital status: Widowed    Spouse name: Not on file  . Number of children: Not on file  . Years of education: Not on file  . Highest education level: Not on file  Occupational History  . Not on file  Social Needs  . Financial resource strain: Not on file  . Food insecurity:    Worry: Not on file    Inability: Not on file  . Transportation needs:    Medical: Not on file    Non-medical: Not on file  Tobacco Use  . Smoking status: Former Smoker    Packs/day: 5.00    Years: 45.00    Pack years: 225.00    Types: Cigars  . Smokeless tobacco: Never Used  Substance and Sexual Activity  . Alcohol use: No  . Drug use: No  . Sexual activity: Never  Lifestyle  . Physical activity:    Days per week: Not on file    Minutes per session: Not on file  . Stress: Not on file  Relationships  . Social connections:    Talks on phone: Not on file    Gets together: Not on file    Attends religious service: Not on file    Active member of club or organization: Not on file    Attends meetings of clubs or organizations: Not on file    Relationship status: Not on file  Other Topics Concern  . Not on file  Social History Narrative  . Not on file   Family History  Problem Relation Age of Onset  . Cancer  Mother   . Diabetes Mother   . Cancer Sister    Scheduled Meds: . aspirin  81 mg Oral Daily  . fosfomycin  3 g Oral Q72H  . heparin  5,000 Units Subcutaneous Q8H  . insulin aspart  0-9 Units Subcutaneous TID WC  . insulin glargine  10 Units Subcutaneous QHS   Continuous Infusions: . dextrose 5 % and 0.45% NaCl 125 mL/hr at 01/24/18 0025   PRN Meds:.acetaminophen, hydrALAZINE, ondansetron (ZOFRAN) IV, RESOURCE THICKENUP CLEAR No Known Allergies Review of Systems patient confused.  Denies pain or constipation.  Physical Exam  Elderly gentleman, calm, awake, confused by cooperative. CV rrr resp no distress Abdomen soft, nt LE bilat BKA - well healed.  Vital Signs: BP (!) 156/82 (BP Location: Right Arm)   Pulse 79   Temp 97.9 F (36.6 C) (Oral)   Resp 18   Wt 67.2 kg (148 lb 3.2 oz)   SpO2 100%   BMI 41.68 kg/m  Pain Scale: PAINAD   Pain Score: 0-No pain   SpO2: SpO2: 100 % O2 Device:SpO2: 100 % O2 Flow Rate: .   IO: Intake/output summary:   Intake/Output Summary (Last 24 hours) at 01/24/2018 1048 Last data filed at 01/24/2018 1013 Gross per 24 hour  Intake 1071 ml  Output 1150 ml  Net -79 ml    LBM:   Baseline Weight: Weight: 74.8 kg (165 lb) Most recent weight: Weight: 67.2 kg (148 lb 3.2 oz)     Palliative Assessment/Data: 30%     Time In: 9:30 Time Out: 11:00 Time Total: 90 min. Greater than 50%  of this time was spent counseling and coordinating care related to the above assessment and plan.  Signed by: Florentina Jenny, PA-C Palliative Medicine Pager: [phone]  Please contact Palliative Medicine Team phone at [phone] for questions and concerns.  For individual provider: See Shea Evans

## 2018-01-24 NOTE — Progress Notes (Signed)
No DNR arm band is applied to pt as order by palliative PA, because family thinks he may become anxious with it on his arm.

## 2018-01-24 NOTE — [place] FL2 (Addendum)
[place] MEDICAID FL2 LEVEL OF CARE SCREENING TOOL     IDENTIFICATION  Patient Name: Marc Wheeler Birthdate: 09/28/1941 Sex: male Admission Date (Current Location): 01/21/2018  [facility] [place]   Facility and Address:  [facility], [address], [place], [place] [address]      Provider Number: [id number]  Attending Physician Name and Address:  Tyrone NineGrunz, Ryan B, MD  Relative Name and Phone Number:       Current Level of Care: Hospital Recommended Level of Care: Skilled Nursing Facility(with palliative) Prior Approval Number:    Date Approved/Denied:   PASRR Number: [id number]  Discharge Plan: SNF(with palliative)    Current Diagnoses: Patient Active Problem List   Diagnosis Date Noted  . DNR (do not resuscitate)   . Palliative care encounter   . Hospice care   . Malnutrition of moderate degree 01/23/2018  . Chronic CHF (congestive heart failure) (HCC) 01/22/2018  . Type II diabetes mellitus with renal manifestations (HCC) 01/21/2018  . UTI (urinary tract infection) 01/21/2018  . Acute metabolic encephalopathy 01/21/2018  . Sepsis (HCC) 01/21/2018  . Hypernatremia 01/21/2018  . 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 Post-operative complication 01/04/2018  . Acute renal failure superimposed on stage 4 chronic kidney disease (HCC) 01/04/2018  . Dermatitis 12/25/2016  . Allergic rhinitis 11/20/2016  . CKD (chronic kidney disease), stage III (HCC) 08/16/2016  . Type II diabetes mellitus with nephropathy (HCC) 07/16/2016  . Dyslipidemia associated with type 2 diabetes mellitus (HCC) 02/10/2016  . 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 S/P bilateral BKA (below knee amputation) (HCC) 11/14/2014  . CKD stage 3 due to type 2 diabetes mellitus (HCC) 07/16/2014  . Hypertensive renal disease 03/05/2014  . Diabetic retinopathy (HCC) 01/21/2013  . Essential hypertension 01/21/2013  . CAD (coronary  artery disease) 01/21/2013  . Constipation 01/21/2013  . 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    Orientation RESPIRATION BLADDER Height & Weight     Self  Normal Incontinent, Indwelling catheter Weight: 148 lb 3.2 oz (67.2 kg) Height:     BEHAVIORAL SYMPTOMS/MOOD NEUROLOGICAL BOWEL NUTRITION STATUS  Other (Comment)(Physically aggressive: Pushing tray away, pushing nurses hands away when they try to work with him, etc. Not hitting.) (None) Incontinent Diet(Renal with fluid restriction: 1200 mL.)  AMBULATORY STATUS COMMUNICATION OF NEEDS Skin     Verbally Surgical wounds, Other (Comment)(Amputation, rash.)                       Personal Care Assistance Level of Assistance              Functional Limitations Info  Sight, Hearing, Speech Sight Info: Adequate Hearing Info: Adequate Speech Info: Adequate    SPECIAL CARE FACTORS FREQUENCY  Blood pressure                    Contractures Contractures Info: Not present    Additional Factors Info  Code Status, Allergies Code Status Info: DNR Allergies Info: NKDA        Isolation: Contact precautions: ESBL   Current Medications (01/24/2018):  This is the current hospital active medication list Current Facility-Administered Medications  Medication Dose Route Frequency Provider Last Rate Last Dose  . acetaminophen (TYLENOL) suppository 650 mg  650 mg Rectal Q6H PRN Lorretta HarpNiu, Xilin, MD      . aspirin chewable tablet 81 mg  81 mg Oral Daily Tyrone NineGrunz, Ryan B, MD   [phone]  mg at 01/24/18 1116  . dextrose 5 %-0.45 % sodium chloride infusion   Intravenous Continuous Tyrone Nine, MD 125 mL/hr at 01/24/18 0025    . fosfomycin (MONUROL) packet 3 g  3 g Oral Q72H Tyrone Nine, MD   3 g at 01/24/18 1108  . heparin injection 5,000 Units  5,000 Units Subcutaneous Q8H Lorretta Harp, MD   5,000 Units at 01/23/18 1336  . hydrALAZINE (APRESOLINE) injection 5 mg  5 mg Intravenous Q2H PRN Lorretta Harp, MD      . insulin aspart (novoLOG) injection 0-9 Units   0-9 Units Subcutaneous TID WC Lorretta Harp, MD   1 Units at 01/24/18 0827  . insulin glargine (LANTUS) injection 10 Units  10 Units Subcutaneous QHS Tyrone Nine, MD   10 Units at 01/23/18 2223  . ondansetron (ZOFRAN) injection 4 mg  4 mg Intravenous Q8H PRN Lorretta Harp, MD      . RESOURCE THICKENUP CLEAR   Oral PRN Tyrone Nine, MD         Discharge Medications: Please see discharge summary for a list of discharge medications.  Relevant Imaging Results:  Relevant Lab Results:   Additional Information SS#: [id number]  Margarito Liner, LCSW

## 2018-01-24 NOTE — Progress Notes (Addendum)
S: more awake but remains disoriented O:BP (!) 189/81 (BP Location: Right Arm)   Pulse 83   Temp (!) 97.4 F (36.3 C) (Oral)   Resp 18   Wt 67.2 kg (148 lb 3.2 oz)   SpO2 (!) 70%   BMI 41.68 kg/m   Intake/Output Summary (Last 24 hours) at 01/24/2018 1231 Last data filed at 01/24/2018 1013 Gross per 24 hour  Intake 1071 ml  Output 1150 ml  Net -79 ml   Intake/Output: I/O last 3 completed shifts: In: 2271 [I.V.:2171; IV Piggyback:100] Out: 1500 [Urine:1500]  Intake/Output this shift:  Total I/O In: -  Out: 200 [Urine:200] Weight change:  Gen: somnolent but arousable CVS: no rub Resp: cta Abd: benign Ext: s/p bilateral BKA's, no edema  Recent Labs  Lab 01/21/18 1858 01/21/18 1902 01/22/18 0325 01/23/18 0459 01/23/18 0744 01/24/18 0651  NA 151* 148* 146* 132* 147* 143  K 4.7 4.7 4.4 2.6* 3.7 4.1  CL 117* 113* 112* 106 116* 117*  CO2  --  19* 17* 17* 20* 19*  GLUCOSE 163* 163* 199* 1,230* 157* 220*  BUN 80* 99* 95* 69* 90* 72*  CREATININE 6.40* 6.26* 6.42* 3.76* 4.58* 3.38*  ALBUMIN  --  3.1*  --   --   --   --   CALCIUM  --  9.6 8.8* 6.5* 8.7* 8.6*  AST  --  39  --   --   --   --   ALT  --  36  --   --   --   --    Liver Function Tests: Recent Labs  Lab 01/21/18 1902  AST 39  ALT 36  ALKPHOS 82  BILITOT 0.9  PROT 8.5*  ALBUMIN 3.1*   No results for input(s): LIPASE, AMYLASE in the last 168 hours. Recent Labs  Lab 01/21/18 1913  AMMONIA 60*   CBC: Recent Labs  Lab 01/21/18 1858 01/21/18 1902 01/22/18 0325  WBC  --  7.8 9.5  NEUTROABS  --  5.6  --   HGB 12.2* 11.0* 10.2*  HCT 36.0* 35.3* 32.6*  MCV  --  83.6 84.0  PLT  --  219 205   Cardiac Enzymes: No results for input(s): CKTOTAL, CKMB, CKMBINDEX, TROPONINI in the last 168 hours. CBG: Recent Labs  Lab 01/23/18 1332 01/23/18 1652 01/23/18 2053 01/24/18 0740 01/24/18 1138  GLUCAP 107* 102* 152* 137* 117*    Iron Studies: No results for input(s): IRON, TIBC, TRANSFERRIN, FERRITIN in  the last 72 hours. Studies/Results: [place] Renal  Result Date: 01/23/2018 CLINICAL DATA:  Acute renal failure. History of diabetes and hypertension. EXAM: RENAL / URINARY TRACT ULTRASOUND COMPLETE COMPARISON:  10/01/2013 FINDINGS: Right Kidney: Length: 8.9 cm. Diffusely increased parenchymal echotexture and mild parenchymal thinning. No hydronephrosis or solid mass identified. Lower pole is partially obscured by overlying bowel gas. Left Kidney: Not able to visualize the left kidney due to overlying bowel gas. Bladder: Bladder is moderately distended. Mild bladder wall thickening at 7.6 mm. No intraluminal filling defects. IMPRESSION: 1. Changes consistent with chronic medical renal disease on the right kidney. No hydronephrosis. 2. Unable to visualize the left kidney due to overlying bowel gas. 3. Diffuse bladder wall thickening may indicate cystitis. Electronically Signed   By: Burman NievesWilliam  Stevens M.D.   On: 01/23/2018 01:29   . aspirin  81 mg Oral Daily  . fosfomycin  3 g Oral Q72H  . heparin  5,000 Units Subcutaneous Q8H  . insulin aspart  0-9 Units Subcutaneous  TID WC  . insulin glargine  10 Units Subcutaneous QHS    BMET    Component Value Date/Time   NA 143 01/24/2018 0651   NA 144 12/10/2016   K 4.1 01/24/2018 0651   CL 117 (H) 01/24/2018 0651   CO2 19 (L) 01/24/2018 0651   GLUCOSE 220 (H) 01/24/2018 0651   BUN 72 (H) 01/24/2018 0651   BUN 16 12/10/2016   CREATININE 3.38 (H) 01/24/2018 0651   CREATININE 2.29 (H) 11/29/2014 0600   CALCIUM 8.6 (L) 01/24/2018 0651   GFRNONAA 16 (L) 01/24/2018 0651   GFRAA 19 (L) 01/24/2018 0651   CBC    Component Value Date/Time   WBC 9.5 01/22/2018 0325   RBC 3.88 (L) 01/22/2018 0325   HGB 10.2 (L) 01/22/2018 0325   HCT 32.6 (L) 01/22/2018 0325   PLT 205 01/22/2018 0325   MCV 84.0 01/22/2018 0325   MCH 26.3 01/22/2018 0325   MCHC 31.3 01/22/2018 0325   RDW 17.0 (H) 01/22/2018 0325   LYMPHSABS 1.6 01/21/2018 1902   MONOABS 0.5 01/21/2018 1902    EOSABS 0.0 01/21/2018 1902   BASOSABS 0.0 01/21/2018 1902    Assessment/Plan: 1. AKI/CKD stage 4- agree that he appears volume depleted and concur with IVF's and holding diuretics. He is a poor dialysis candidate so I am very thankful to Palliative care to help set goals/limits of care. No indication for dialysis at this time. Agree with holding vancomycin. 2. Klebsiella UTI- Currently on rocephin 3. Altered mental status/acute metabolic encephalopathy - likely due to UTI and AKI. 4. Bladder outlet obstruction/urinary retention- improved after foley catheter inserted. 5. PVD- s/p bilateral BKA's 6. hypernatremia- improving with IVF's may need to increase rate of 1/2 NS until he starts taking po 7. DM type 2- per primary 8. CAD- stable 9. Chronic diastolic CHF- volume depleted as above. Lasix on hold 10. HTN- low bp off of meds due to infection 11. Anemia of CKd stage 3. Was started on aranesp sq every 4 weeks in March, received dose on 01/10/18 12. Disposition- pt is not yet on dialysis but agree with palliative care consult as he is a poor long term dialysis candidate given his advanced age, multiple co-morbidities, and poor functional status.  Agree with the family decision to transition to comfort measures and not pursue HD.  Will sign off.  Please call with questions or concerns.  Outpatient follow up if patient and family are amenable otherwise continue with comfort measures.    Irena Cords, MD [employer] [phone]

## 2018-01-24 NOTE — Clinical Social Work Note (Signed)
Clinical Social Work Assessment  Patient Details  Name: Marc Wheeler MRN: [id number] Date of Birth: 02/11/1941  Date of referral:  01/24/18               Reason for consult:  Discharge Planning                Permission sought to share information with:  Facility Medical sales representativeContact Representative, Family Supports Permission granted to share information::  Yes, Verbal Permission Granted  Name::     Marc Wheeler  Agency::  [facility]  Relationship::  Son  Contact Information:  [phone]  Housing/Transportation Living arrangements for the past 2 months:  Skilled Nursing Facility Source of Information:  Medical Team, Adult Children, Facility Patient Interpreter Needed:  None Criminal Activity/Legal Involvement Pertinent to Current Situation/Hospitalization:  No - Comment as needed Significant Relationships:  Adult Children Lives with:  Facility Resident Do you feel safe going back to the place where you live?  Yes Need for family participation in patient care:  Yes (Comment)  Care giving concerns:  Patient is a long-term resident at [facility].   Social Worker assessment / plan:  Patient not fully oriented. No supports at bedside. CSW called patient's son. CSW introduced role and explained that discharge planning would be discussed. Patient's son confirmed he is a resident at [facility] but wants palliative follow up instead of hospice. No further concerns. CSW encouraged patient's son to contact CSW as needed. CSW will continue to follow patient and his son for support and facilitate discharge back to SNF once medically stable.  Employment status:  Retired Database administratornsurance information:  Managed Medicare PT Recommendations:  Not assessed at this time Information / Referral to community resources:  Skilled Nursing Facility  Patient/Family's Response to care:  Patient not fully oriented. Patient's son agreeable to return to SNF with palliative services rather than hospice. Patient's  family supportive and involved in patient's care. Patient's son appreciated social work intervention.  Patient/Family's Understanding of and Emotional Response to Diagnosis, Current Treatment, and Prognosis:  Patient not fully oriented. Patient's son has a good understanding of the reason for admission and plan to return to SNF with palliative follow up at discharge. Patient's son appears happy with hospital care.  Emotional Assessment Appearance:  Appears stated age Attitude/Demeanor/Rapport:  Unable to Assess Affect (typically observed):  Unable to Assess Orientation:  Oriented to Self Alcohol / Substance use:  Never Used Psych involvement (Current and /or in the community):  No (Comment)  Discharge Needs  Concerns to be addressed:  Care Coordination Readmission within the last 30 days:  Yes Current discharge risk:  Cognitively Impaired Barriers to Discharge:  Continued Medical Work up   Margarito LinerSarah C Jude Linck, LCSW 01/24/2018, 1:43 PM

## 2018-01-24 NOTE — Progress Notes (Signed)
Patient did refuse heparin injections but did agree to take lantus and received new IV and foley care.

## 2018-01-24 NOTE — Progress Notes (Signed)
PROGRESS NOTE  Marc SorrelJimmy R Wieseler  ZOX:[id number] DOB: 04/17/1941 DOA: 01/21/2018 PCP: Molinda BailiffSmith, Ernest, PA   Brief Narrative: Marc Wheeler is a 77 y.o. 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 On arrival the patient was unable to provide history, temperature 100.22F, WBC 7.8, and urinalysis highly suggestive of UTI. Creatinine was >6 and sodium was 148. CT head negative for acute findings. IV fluids, empiric antibiotics were started and he was admitted for acute encephalopathy due to UTI.    Assessment & Plan: Principal Problem:   UTI (urinary tract infection) Active Problems:   Essential hypertension   CAD (coronary artery disease)   S/P bilateral BKA (below knee amputation) (HCC)   Acute renal failure superimposed on stage 4 chronic kidney disease (HCC)   Type II diabetes mellitus with renal manifestations (HCC)   Acute metabolic encephalopathy   Sepsis (HCC)   Hypernatremia   Chronic CHF (congestive heart failure) (HCC)   Malnutrition of moderate degree  Sepsis due to ESBL Klebsiella UTI: Sepsis physiology improved. Significant cystitis on U/S. - Received zosyn. Transitioned to fosfomycin (q72h x2) since he passed swallow study but this was not given yesterday. Will reattempt to put into applesauce today and if not given, will order ertapenem.  - Monitoring blood culture data  Acute metabolic encephalopathy: Possibly due to UTI, dehydration/hypernatremia, renal failure, urinary retention primarily. Doubt hypernatremia. Ammonia was checked and is mildly elevated, though LFTs are not elevated and no treatment has been started. - Antibiotics as above, IVF's - Delirium precautions - SLP evaluation prior to restarting po meds and feeding. - Has had small bowel movements with suppository.  Acute renal failure on CKD stage IV: SCr 6.26 from baseline of  2-2.5, followed by Dr. Arrie Aranoladonato as outpatient s/p AV graft in left arm on 12/31/2017. Suspect prerenal azotemia and possible ATN from sepsis, also possibly postrenal with retention. No indication for HD currently. Renal U/S no hydro on right with medical renal disease features. Left kidney not seen.  - Cr improving with IVF's and foley. Nephrology following. - Continue IV fluids at 125cc/hr until taking better po and hold diuretic.  - Avoid nephrotoxic agents, stopped vanc/zosyn - Palliative care consulted, family meeting pending. Pt currently full code.  Urinary retention: Unknown chronicity, could be cause of UTI and contribution to renal failure. Bladder scan 4/3 showed 673cc.  - Inserted foley catheter, monitor strict I/O. Will pursue voiding trial prior to discharge.  Hypernatremia: Mild, stable, likely due to dehydration.  - Continue hypotonic saline and monitor in AM.   IDT2DM:  - Decreased lantus 50u > 10u, continue SSI, will need to increase dose with improving PO intake - Hold tradjenta  CAD, chronic diastolic CHF: No evidence of hypervolemia. BNP 91 - Continue ASA, holding statin for now - Holding lasix, monitor I/O and daily weights   HTN: Currently soft BP - Holding home medications including norvasc, metoprolol, clonidine  DVT prophylaxis: Heparin Code Status: Full  Family Communication: None at bedside this AM. Will attempt to call again today.   Disposition Plan: Likely return to SNF once mental status returns to baseline.   Consultants:   Palliative care team  Nephrology  Procedures:   Foley 4/3  Antimicrobials:  Vancomycin, zosyn 4/2  Ceftriaxone 4/2 - 4/4  Fosfomycin 4/5, 4/8  Subjective: RN reports poor per oral intake though confusion  is improving slowly. UOP improved. Not at baseline mentally.   Objective: Vitals:   01/23/18 0600 01/23/18 1200 01/23/18 1952 01/24/18 0634  BP: 118/67 (!) 145/75 (!) 151/79 (!) 156/82  Pulse: 82 80 81 79    Resp:   18 18  Temp: (!) 97.5 F (36.4 C)  (!) 97.4 F (36.3 C) 97.9 F (36.6 C)  TempSrc: Oral  Oral Oral  SpO2:  100% 100% 100%  Weight:        Intake/Output Summary (Last 24 hours) at 01/24/2018 0859 Last data filed at 01/24/2018 0544 Gross per 24 hour  Intake 1071 ml  Output 950 ml  Net 121 ml   Filed Weights   01/21/18 2000 01/22/18 0344  Weight: 74.8 kg (165 lb) 64.5 kg (142 lb 3.2 oz)    Gen: Frail 77 y.o. male in no distress  Pulm: Non-labored breathing room air. Clear to auscultation bilaterally.  CV: Regular rate and rhythm. No murmur, rub, or gallop. No JVD, no pedal edema. GI: Abdomen soft, non-tender, non-distended, with normoactive bowel sounds. No organomegaly or masses felt. Ext: Warm, no deformities aside from bilateral BKA with dry stump sites. Left AVG +thrill without induration, fluctuance, erythema or discharge. Skin: As above, otherwise no rashes or ulcers Neuro: Alert, oriented to self, feels like he recognizes me, thinks he's in a hospital in [place], more conversant today than yesterday. No focal neurological deficits, though pt unable to fully cooperate. Psych: UTD  CBC: Recent Labs  Lab 01/21/18 1858 01/21/18 1902 01/22/18 0325  WBC  --  7.8 9.5  NEUTROABS  --  5.6  --   HGB 12.2* 11.0* 10.2*  HCT 36.0* 35.3* 32.6*  MCV  --  83.6 84.0  PLT  --  219 205   Basic Metabolic Panel: Recent Labs  Lab 01/21/18 1902 01/22/18 0325 01/23/18 0459 01/23/18 0744 01/24/18 0651  NA 148* 146* 132* 147* 143  K 4.7 4.4 2.6* 3.7 4.1  CL 113* 112* 106 116* 117*  CO2 19* 17* 17* 20* 19*  GLUCOSE 163* 199* 1,230* 157* 220*  BUN 99* 95* 69* 90* 72*  CREATININE 6.26* 6.42* 3.76* 4.58* 3.38*  CALCIUM 9.6 8.8* 6.5* 8.7* 8.6*   Urine analysis:    Component Value Date/Time   COLORURINE AMBER (A) 01/21/2018 2139   APPEARANCEUR CLOUDY (A) 01/21/2018 2139   LABSPEC 1.014 01/21/2018 2139   PHURINE 5.0 01/21/2018 2139   GLUCOSEU NEGATIVE 01/21/2018 2139    HGBUR MODERATE (A) 01/21/2018 2139   BILIRUBINUR NEGATIVE 01/21/2018 2139   KETONESUR 5 (A) 01/21/2018 2139   PROTEINUR 100 (A) 01/21/2018 2139   UROBILINOGEN 0.2 11/29/2014 1524   NITRITE NEGATIVE 01/21/2018 2139   LEUKOCYTESUR LARGE (A) 01/21/2018 2139   Recent Results (from the past 240 hour(s))  Blood culture (routine x 2)     Status: None (Preliminary result)   Collection Time: 01/21/18  6:30 PM  Result Value Ref Range Status   Specimen Description BLOOD RIGHT HAND  Final   Special Requests IN PEDIATRIC BOTTLE Blood Culture adequate volume  Final   Culture   Final    NO GROWTH 2 DAYS Performed at [facility], [address] [place], [place] [address]    Report Status PENDING  Incomplete  Blood culture (routine x 2)     Status: None (Preliminary result)   Collection Time: 01/21/18  6:40 PM  Result Value Ref Range Status   Specimen Description BLOOD RIGHT HAND  Final   Special Requests  IN PEDIATRIC BOTTLE Blood Culture adequate volume  Final   Culture   Final    NO GROWTH 2 DAYS Performed at [facility], [address], [place], [place] [address]    Report Status PENDING  Incomplete  Urine culture     Status: Abnormal   Collection Time: 01/21/18  9:39 PM  Result Value Ref Range Status   Specimen Description URINE, CATHETERIZED  Final   Special Requests NONE  Final   Culture (A)  Final    >=100,000 COLONIES/mL KLEBSIELLA PNEUMONIAE Confirmed Extended Spectrum Beta-Lactamase Producer (ESBL).  In bloodstream infections from ESBL organisms, carbapenems are preferred over piperacillin/tazobactam. They are shown to have a lower risk of mortality. Performed at [facility], [address] [place], [place] [address]    Report Status 01/23/2018 FINAL  Final   Organism ID, Bacteria KLEBSIELLA PNEUMONIAE (A)  Final      Susceptibility   Klebsiella pneumoniae - MIC*    AMPICILLIN >=32 RESISTANT Resistant     CEFAZOLIN >=64 RESISTANT Resistant     CEFTRIAXONE  >=64 RESISTANT Resistant     CIPROFLOXACIN >=4 RESISTANT Resistant     GENTAMICIN >=16 RESISTANT Resistant     IMIPENEM <=0.25 SENSITIVE Sensitive     NITROFURANTOIN 128 RESISTANT Resistant     TRIMETH/SULFA >=320 RESISTANT Resistant     AMPICILLIN/SULBACTAM >=32 RESISTANT Resistant     PIP/TAZO 64 INTERMEDIATE Intermediate     Extended ESBL POSITIVE Resistant     * >=100,000 COLONIES/mL KLEBSIELLA PNEUMONIAE      Radiology Studies: US Renal  Result Date: 01/23/2018 CLINICAL DATA:  Acute renal failure. History of diabetes and hypertension. EXAM: RENAL / URINARY TRACT ULTRASOUND COMPLETE COMPARISON:  10/01/2013 FINDINGS: Right Kidney: Length: 8.9 cm. Diffusely increased parenchymal echotexture and mild parenchymal thinning. No hydronephrosis or solid mass identified. Lower pole is partially obscured by overlying bowel gas. Left Kidney: Not able to visualize the left kidney due to overlying bowel gas. Bladder: Bladder is moderately distended. Mild bladder wall thickening at 7.6 mm. No intraluminal filling defects. IMPRESSION: 1. Changes consistent with chronic medical renal disease on the right kidney. No hydronephrosis. 2. Unable to visualize the left kidney due to overlying bowel gas. 3. Diffuse bladder wall thickening may indicate cystitis. Electronically Signed   By: Burman Nieves M.D.   On: 01/23/2018 01:29    Scheduled Meds: . aspirin  81 mg Oral Daily  . bisacodyl  10 mg Rectal Daily  . fosfomycin  3 g Oral Q72H  . heparin  5,000 Units Subcutaneous Q8H  . insulin aspart  0-9 Units Subcutaneous TID WC  . insulin glargine  10 Units Subcutaneous QHS   Continuous Infusions: . dextrose 5 % and 0.45% NaCl 125 mL/hr at 01/24/18 0025     LOS: 3 days   Time spent: 25 minutes.  Hazeline Junker, MD [facility] Pager [phone]  If 7PM-7AM, please contact night-coverage [email or web address] Password [email or web address] 01/24/2018, 8:59 AM

## 2018-01-24 NOTE — Progress Notes (Signed)
Nutrition Follow-up  DOCUMENTATION CODES:   Non-severe (moderate) malnutrition in context of chronic illness  INTERVENTION:   -Recommend liberalizing diet as much as medically feasible to promote oral intake -30 ml Prostat BID, each supplement provides 100 kcals and 15 grams protein -Boost Breeze po BID, each supplement provides 250 kcal and 9 grams of protein  NUTRITION DIAGNOSIS:   Moderate Malnutrition related to chronic illness(CHF, CKD) as evidenced by mild fat depletion, moderate fat depletion, mild muscle depletion, moderate muscle depletion.  Ongoing  GOAL:   Patient will meet greater than or equal to 90% of their needs  Progressing  MONITOR:   Diet advancement, Labs, Weight trends, Skin, I & O's  REASON FOR ASSESSMENT:   Malnutrition Screening Tool    ASSESSMENT:   Marc Wheeler is a 77 y.o. 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   4/4- s/p BSE, advanced to dysphagia 2 diet with nectar thick liquids  Case discussed with RN prior to visit, who reports pt is more alert today. Pt tolerating diet texture well, but has not received meds yet. Pt consumed only a few bites of eggs this morning at breakfast.   Spoke with pt and daughter at bedside. Pt more alert and interactive than previous visit, but answered some questions inappropriately. Per pt daughter, pt has had decreased oral intake over the past 6 months. Pt typically eats well at breakfast, then intake decreases significantly throughout the day. Pt is a very selective eater and requests foods with a lot of "seasoning". Daughter reports that pt will consume either Ensure or Boost supplements on occasion. Offered pt Ensure, Nepro, or Magic Cup supplement, however, pt refused, due to milky consistency and reportedly being lactose intolerant. When explained that Ensure  supplement is lactose-free, pt continued to refuse, saying "give it all to the cows".   Attempted to elicit food preference from pt, however, pt would not answer question despite probing.   ADDENDUM: After RD visit, pt was advanced to a regular consistency diet per SLP (now on a renal det with 1200 ml fluid restriction). Per palliative care notes, plan to medically optimize pt, then elect for hospice/comfort measures when he returns to SNF [facility]([place]).   Labs reviewed: CBGS: 102-152 (inpatient orders for glycemic control are 0-9 units insulin aspart TID with meals and 10 units insulin glargine q HS).   Diet Order:  Diet renal with fluid restriction Fluid restriction: 1200 mL Fluid; Room service appropriate? Yes; Fluid consistency: Thin  EDUCATION NEEDS:   Not appropriate for education at this time  Skin:  Skin Assessment: Reviewed RN Assessment  Last BM:  PTA  Height:   Ht Readings from Last 1 Encounters:  04/02/17 4\' 2"  (1.27 m)    Weight:   Wt Readings from Last 1 Encounters:  01/24/18 148 lb 3.2 oz (67.2 kg)    Ideal Body Weight:  70.3 kg  BMI:  Body mass index is 41.68 kg/m.  Estimated Nutritional Needs:   Kcal:  1400-1600  Protein:  70-85 grams  Fluid:  1.4-1.6 L    Timothy Townsel A. Mayford KnifeWilliams, RD, LDN, CDE Pager: [phone] After hours Pager: [phone]

## 2018-01-25 ENCOUNTER — Encounter (HOSPITAL_COMMUNITY): Payer: Self-pay

## 2018-01-25 DIAGNOSIS — E1121 Type 2 diabetes mellitus with diabetic nephropathy: Secondary | ICD-10-CM

## 2018-01-25 DIAGNOSIS — I5022 Chronic systolic (congestive) heart failure: Secondary | ICD-10-CM

## 2018-01-25 LAB — GLUCOSE, CAPILLARY: GLUCOSE-CAPILLARY: 104 mg/dL — AB (ref 65–99)

## 2018-01-25 LAB — BASIC METABOLIC PANEL
ANION GAP: 8 (ref 5–15)
BUN: 58 mg/dL — ABNORMAL HIGH (ref 6–20)
CHLORIDE: 116 mmol/L — AB (ref 101–111)
CO2: 20 mmol/L — ABNORMAL LOW (ref 22–32)
Calcium: 8.5 mg/dL — ABNORMAL LOW (ref 8.9–10.3)
Creatinine, Ser: 2.71 mg/dL — ABNORMAL HIGH (ref 0.61–1.24)
GFR calc Af Amer: 24 mL/min — ABNORMAL LOW (ref >60)
GFR, EST NON AFRICAN AMERICAN: 21 mL/min — AB (ref >60)
Glucose, Bld: 136 mg/dL — ABNORMAL HIGH (ref 65–99)
POTASSIUM: 3.7 mmol/L (ref 3.5–5.1)
SODIUM: 144 mmol/L (ref 135–145)

## 2018-01-25 MED ORDER — OXYCODONE-ACETAMINOPHEN 5-325 MG PO TABS: 1.0000 | ORAL_TABLET | Freq: Four times a day (QID) | ORAL | 0 refills | Status: AC | PRN

## 2018-01-25 MED ORDER — INSULIN GLARGINE 300 UNIT/ML ~~LOC~~ SOPN: 10.0000 [IU] | PEN_INJECTOR | Freq: Every day | SUBCUTANEOUS | Status: AC

## 2018-01-25 MED ORDER — FOSFOMYCIN TROMETHAMINE 3 G PO PACK: 3.0000 g | PACK | Freq: Once | ORAL | Status: AC

## 2018-01-25 NOTE — Progress Notes (Signed)
Call placed to social work @ [phone] to make sure patient is all set to leave today, as long as he voids after foley removal.

## 2018-01-25 NOTE — Progress Notes (Signed)
Foley inserted per MD order as discharge treatment. Follow up care to be provided by facility.

## 2018-01-25 NOTE — Clinical Social Work Note (Addendum)
Clinical Social Worker facilitated patient discharge including contacting patient family and facility to confirm patient discharge plans.  Clinical information faxed to facility and family agreeable with plan.  CSW arranged ambulance transport via [facility] to [facility].  RN to call report prior to discharge [phone]).  Clinical Social Worker will sign off for now as social work intervention is no longer needed. Please consult us again if new need arises.  Macario GoldsJesse Jerard [place], [place] [phone]

## 2018-01-25 NOTE — Discharge Summary (Signed)
Physician Discharge Summary  Marc SorrelJimmy R Wheeler ZOX:[id number] DOB: 09/06/1941 DOA: 01/21/2018  PCP: Molinda BailiffSmith, Ernest, PA  Admit date: 01/21/2018 Discharge date: 01/25/2018  Admitted From: SNF Disposition: SNF with hospice care   Recommendations for Outpatient Follow-up:  1. Continue hospice care at nursing home 2. Repeat fosfomycin x1 on 4/8 3. Discharged with foley catheter for urinary retention possibly related to UTI. Failed trial of voiding prior to discharge, so discharged with foley (placed 4/6) and recommend trial of voiding in the next week.  Home Health: N/A Equipment/Devices: Per SNF Discharge Condition: Stable CODE STATUS: DNR - plan to not return to hospital unless patient cannot be kept comfortable at facility. Diet recommendation: Liberalized based on goals of care - regular.  Brief/Interim Summary: Marc Wheeler is a 77 y.o. 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 On arrival the patient was unable to provide history, temperature 100.33F, WBC 7.8, and urinalysis highly suggestive of UTI. Creatinine was >6 and sodium was 148. CT head negative for acute findings. IV fluids, empiric antibiotics were started and he was admitted for acute encephalopathy due to UTI with acute renal failure on CKD. With IV fluids creatinine improved. Urine culture grew ESBL Klebsiella which was treated with fosfomycin. Mental status has improved significantly. Palliative care conducted a family meeting with goals of care clearly delineated as below. He is now stable for discharge with improved renal function and mental status. Oral intake has improved to the point that he is able to maintain his own hydration status.   Discharge Diagnoses:  Principal Problem:   UTI (urinary tract infection) Active Problems:   Essential hypertension   CAD (coronary artery disease)    S/P bilateral BKA (below knee amputation) (HCC)   Acute renal failure superimposed on stage 4 chronic kidney disease (HCC)   Type II diabetes mellitus with renal manifestations (HCC)   Acute metabolic encephalopathy   Sepsis (HCC)   Hypernatremia   Chronic CHF (congestive heart failure) (HCC)   Malnutrition of moderate degree   DNR (do not resuscitate)   Palliative care encounter   Hospice care  Acute renal failure on CKD stage IV: SCr 6.26 from baseline of 2-2.5, followed by Dr. Arrie Aranoladonato as outpatient s/p AV graft in left arm on 12/31/2017. Suspect prerenal azotemia and possible ATN from sepsis, also possibly postrenal with retention. No indication for HD currently. Renal U/S no hydro on right with medical renal disease features. Left kidney not seen.  - Cr improving with IVF's and foley. Nephrology followed along while admitted. Cr 2.71 on discharge.  - Avoid nephrotoxic agents - Palliative care consulted: As the majority of his adult children - D'etta and [name] are the default HCPOA.  We reviewed a MOST form and they made the following selections:  DNR  Comfort measures only - Do not return to the hospital unless he can not be kept comfortable in his location  Determine use/limitations of antibiotics at the time the need arises  IVF if needed for a defined trial period   No feeding tube - ever  No Hemodialysis They request that Hospice services be engaged and support him at the facility.  Sepsis due to ESBL Klebsiella UTI: Sepsis physiology improved. Significant cystitis on U/S. - Received zosyn. Transitioned to fosfomycin (q72h x2) since he passed swallow study.  - Monitoring blood culture data (no growth  at 4 days)  Acute metabolic encephalopathy: Possibly due to UTI, dehydration/hypernatremia, renal failure, urinary retention primarily. Doubt hypernatremia. Ammonia was checked and is mildly elevated, though LFTs are not elevated and no treatment has been started.  Significantly improved.  Urinary retention: Unknown chronicity, could be cause of UTI and contribution to renal failure. Bladder scan 4/3 showed 673cc.  - Inserted foley catheter, monitor strict I/O. Voiding trial prior to discharge was unsuccessful, so foley reinserted 4/6, recommend voiding trial after last dose of antibiotics and referral to urology if unable to void.  Hypernatremia: Mild, stable, likely due to dehydration. Resolved with IVF. - Recommend to offer water frequently. Diet is liberalized to avoid salt restriction due to goals of care.  IDT2DM:  - Decreased lantus 50u > 10u and has been at inpatient goal for the most part, lowest CBG 68mg /dl. I have discontinued sliding scale insulin to avoid hypoglycemia in accordance with goals of care, mostly aiming to avoid dangerous hyperglycemia and any hypoglycemia. Continue titration based on values at NH. - Restarted tradjenta  CAD, chronic diastolic CHF: No evidence of hypervolemia. BNP 91 - Continue ASA, statin - Holding lasix while inpatient, monitor I/O.   HTN:  - Restart BP medications and titrate as indicated: norvasc, metoprolol, clonidine  Discharge Instructions  Allergies as of 01/25/2018   No Known Allergies     Medication List    STOP taking these medications   cloNIDine 0.2 mg/24hr patch Commonly known as:  CATAPRES - Dosed in mg/24 hr   HUMALOG KWIKPEN 100 UNIT/ML KiwkPen Generic drug:  insulin lispro   polyethylene glycol packet Commonly known as:  MIRALAX / GLYCOLAX   senna 8.6 MG Tabs tablet Commonly known as:  SENOKOT     TAKE these medications   acetaminophen 325 MG tablet Commonly known as:  TYLENOL Take 650 mg by mouth every 4 (four) hours as needed (pain).   amLODipine 10 MG tablet Commonly known as:  NORVASC Take 10 mg by mouth daily. Reported on 12/29/2015   aspirin 81 MG tablet Take 81 mg by mouth daily.   atorvastatin 80 MG tablet Commonly known as:  LIPITOR Take 80 mg by mouth  every evening.   b complex-vitamin c-folic acid 0.8 MG Tabs tablet Take 1 tablet by mouth at bedtime.   calcitRIOL 0.25 MCG capsule Commonly known as:  ROCALTROL Take 0.25 mcg by mouth every Monday, Wednesday, and Friday.   cholecalciferol 1000 units tablet Commonly known as:  VITAMIN D Take 1,000 Units by mouth daily.   docusate sodium 100 MG capsule Commonly known as:  COLACE Take 100 mg by mouth daily.   ENSURE Take 237 mLs by mouth 4 (four) times daily.   fish oil-omega-3 fatty acids 1000 MG capsule Take 1 g by mouth daily.   fosfomycin 3 g Pack Commonly known as:  MONUROL Take 3 g by mouth once for 1 dose. on 4/8.   furosemide 40 MG tablet Commonly known as:  LASIX Take 40 mg by mouth daily.   GLUCAGON EMERGENCY 1 MG injection Generic drug:  glucagon Inject 1 mg into the muscle once as needed (for hypoglycemia).   guaiFENesin 100 MG/5ML Soln Commonly known as:  ROBITUSSIN Take 10 mLs by mouth every 6 (six) hours as needed for cough or to loosen phlegm.   Insulin Glargine 300 UNIT/ML Sopn Commonly known as:  TOUJEO SOLOSTAR Inject 10 Units into the skin at bedtime. What changed:  how much to take   linagliptin 5 MG Tabs  tablet Commonly known as:  TRADJENTA Take 5 mg by mouth daily.   loratadine 10 MG tablet Commonly known as:  CLARITIN Take 10 mg by mouth daily.   metoprolol tartrate 50 MG tablet Commonly known as:  LOPRESSOR Take 50 mg by mouth 2 (two) times daily. What changed:  Another medication with the same name was removed. Continue taking this medication, and follow the directions you see here.   oxyCODONE-acetaminophen 5-325 MG tablet Commonly known as:  PERCOCET/ROXICET Take 1 tablet by mouth every 6 (six) hours as needed for up to 3 days (pain). What changed:  reasons to take this   REFRESH LIQUIGEL OP Place 1 drop into both eyes 3 (three) times daily.   TEMOVATE 0.05 % Generic drug:  clobetasol cream Apply 1 application topically  every 4 (four) hours as needed (rash). Apply to right BKA stump      Contact information for after-discharge care    Destination    [facility] .   Service:  Skilled Nursing Contact information: [address] [place] [address] [phone]             No Known Allergies  Consultations:  Nephrology  Palliative Care  Procedures/Studies: Dg Chest 2 View  Result Date: 01/21/2018 CLINICAL DATA:  Altered mental status. EXAM: CHEST - 2 VIEW COMPARISON:  Chest x-ray dated August 25, 2015. FINDINGS: The heart size and mediastinal contours are within normal limits. Normal pulmonary vascularity. Unchanged elevated left hemidiaphragm with left lower lobe atelectasis. The right lung is clear. No focal consolidation, pleural effusion, or pneumothorax. No acute osseous abnormality. IMPRESSION: No active cardiopulmonary disease. Electronically Signed   By: Obie Dredge M.D.   On: 01/21/2018 20:56   Ct Head Wo Contrast  Result Date: 01/21/2018 CLINICAL DATA:  Altered mental status and combativeness for 2 weeks. EXAM: CT HEAD WITHOUT CONTRAST TECHNIQUE: Contiguous axial images were obtained from the base of the skull through the vertex without intravenous contrast. COMPARISON:  None. FINDINGS: Brain: No evidence of acute infarction, hemorrhage, hydrocephalus, extra-axial collection or mass lesion/mass effect. Cortical atrophy is noted. Vascular: Extensive atherosclerosis. Skull: Intact. Sinuses/Orbits: No acute abnormality. The patient is status post bilateral lens extraction. Other: None. IMPRESSION: No acute abnormality. Atrophy. Extensive atherosclerosis. Electronically Signed   By: Drusilla Kanner M.D.   On: 01/21/2018 19:27   US Renal  Result Date: 01/23/2018 CLINICAL DATA:  Acute renal failure. History of diabetes and hypertension. EXAM: RENAL / URINARY TRACT ULTRASOUND COMPLETE COMPARISON:  10/01/2013 FINDINGS: Right Kidney: Length: 8.9  cm. Diffusely increased parenchymal echotexture and mild parenchymal thinning. No hydronephrosis or solid mass identified. Lower pole is partially obscured by overlying bowel gas. Left Kidney: Not able to visualize the left kidney due to overlying bowel gas. Bladder: Bladder is moderately distended. Mild bladder wall thickening at 7.6 mm. No intraluminal filling defects. IMPRESSION: 1. Changes consistent with chronic medical renal disease on the right kidney. No hydronephrosis. 2. Unable to visualize the left kidney due to overlying bowel gas. 3. Diffuse bladder wall thickening may indicate cystitis. Electronically Signed   By: Burman Nieves M.D.   On: 01/23/2018 01:29   Subjective: Feels well, no pain.  Discharge Exam: Vitals:   01/25/18 0642 01/25/18 1237  BP: (!) 157/84 (!) 166/113  Pulse: 83 89  Resp: 18 20  Temp: 98.2 F (36.8 C) 97.9 F (36.6 C)  SpO2: 100% 100%   Gen: No distress CV: Regular rate and rhythm. No murmur,  rub, or gallop. No JVD, no pedal edema. GI: Abdomen soft, non-tender, non-distended, with normoactive bowel sounds. No organomegaly or masses felt. Ext: Warm, no deformities aside from bilateral BKA with dry stump sites. Left AVG +thrill without induration, fluctuance, erythema or discharge. Neuro: Alert, oriented to self, hospital, thought it was in [facility]. More conversant. No focal deficits noted.  Labs: BNP (last 3 results) Recent Labs    01/21/18 1902  BNP 91.0   Basic Metabolic Panel: Recent Labs  Lab 01/22/18 0325 01/23/18 0459 01/23/18 0744 01/24/18 0651 01/25/18 0041  NA 146* 132* 147* 143 144  K 4.4 2.6* 3.7 4.1 3.7  CL 112* 106 116* 117* 116*  CO2 17* 17* 20* 19* 20*  GLUCOSE 199* 1,230* 157* 220* 136*  BUN 95* 69* 90* 72* 58*  CREATININE 6.42* 3.76* 4.58* 3.38* 2.71*  CALCIUM 8.8* 6.5* 8.7* 8.6* 8.5*   Liver Function Tests: Recent Labs  Lab 01/21/18 1902  AST 39  ALT 36  ALKPHOS 82  BILITOT 0.9  PROT 8.5*  ALBUMIN 3.1*    Recent Labs  Lab 01/21/18 1913  AMMONIA 60*   CBC: Recent Labs  Lab 01/21/18 1858 01/21/18 1902 01/22/18 0325  WBC  --  7.8 9.5  NEUTROABS  --  5.6  --   HGB 12.2* 11.0* 10.2*  HCT 36.0* 35.3* 32.6*  MCV  --  83.6 84.0  PLT  --  219 205   CBG: Recent Labs  Lab 01/24/18 0740 01/24/18 1138 01/24/18 1713 01/24/18 2154 01/25/18 0630  GLUCAP 137* 117* 144* 68 104*   Urinalysis    Component Value Date/Time   COLORURINE AMBER (A) 01/21/2018 2139   APPEARANCEUR CLOUDY (A) 01/21/2018 2139   LABSPEC 1.014 01/21/2018 2139   PHURINE 5.0 01/21/2018 2139   GLUCOSEU NEGATIVE 01/21/2018 2139   HGBUR MODERATE (A) 01/21/2018 2139   BILIRUBINUR NEGATIVE 01/21/2018 2139   KETONESUR 5 (A) 01/21/2018 2139   PROTEINUR 100 (A) 01/21/2018 2139   UROBILINOGEN 0.2 11/29/2014 1524   NITRITE NEGATIVE 01/21/2018 2139   LEUKOCYTESUR LARGE (A) 01/21/2018 2139    Microbiology Recent Results (from the past 240 hour(s))  Blood culture (routine x 2)     Status: None (Preliminary result)   Collection Time: 01/21/18  6:30 PM  Result Value Ref Range Status   Specimen Description BLOOD RIGHT HAND  Final   Special Requests IN PEDIATRIC BOTTLE Blood Culture adequate volume  Final   Culture   Final    NO GROWTH 4 DAYS Performed at [facility], [address], [place], [place] [address]    Report Status PENDING  Incomplete  Blood culture (routine x 2)     Status: None (Preliminary result)   Collection Time: 01/21/18  6:40 PM  Result Value Ref Range Status   Specimen Description BLOOD RIGHT HAND  Final   Special Requests IN PEDIATRIC BOTTLE Blood Culture adequate volume  Final   Culture   Final    NO GROWTH 4 DAYS Performed at [facility], [address] [place], [place] [address]    Report Status PENDING  Incomplete  Urine culture     Status: Abnormal   Collection Time: 01/21/18  9:39 PM  Result Value Ref Range Status   Specimen Description URINE, CATHETERIZED  Final    Special Requests NONE  Final   Culture (A)  Final    >=100,000 COLONIES/mL KLEBSIELLA PNEUMONIAE Confirmed Extended Spectrum Beta-Lactamase Producer (ESBL).  In bloodstream infections from ESBL  organisms, carbapenems are preferred over piperacillin/tazobactam. They are shown to have a lower risk of mortality. Performed at [facility], [address] [place], [place] [address]    Report Status 01/23/2018 FINAL  Final   Organism ID, Bacteria KLEBSIELLA PNEUMONIAE (A)  Final      Susceptibility   Klebsiella pneumoniae - MIC*    AMPICILLIN >=32 RESISTANT Resistant     CEFAZOLIN >=64 RESISTANT Resistant     CEFTRIAXONE >=64 RESISTANT Resistant     CIPROFLOXACIN >=4 RESISTANT Resistant     GENTAMICIN >=16 RESISTANT Resistant     IMIPENEM <=0.25 SENSITIVE Sensitive     NITROFURANTOIN 128 RESISTANT Resistant     TRIMETH/SULFA >=320 RESISTANT Resistant     AMPICILLIN/SULBACTAM >=32 RESISTANT Resistant     PIP/TAZO 64 INTERMEDIATE Intermediate     Extended ESBL POSITIVE Resistant     * >=100,000 COLONIES/mL KLEBSIELLA PNEUMONIAE    Time coordinating discharge: Approximately 40 minutes  Hazeline Junker, MD  [facility] 01/25/2018, 4:36 PM Pager [phone]

## 2018-01-25 NOTE — Progress Notes (Addendum)
Call placed to CCMD to notify of telemetry monitoring d/c per MD at bedside. MD ordered d/c IVF, and foley so pt can attempt to void. MD also stated weight is not necessary.

## 2018-01-25 NOTE — Progress Notes (Signed)
Page to MD 3e13 Mauri ReadingMORRISON FYI no DC order yet, cannot print AVS.

## 2018-01-25 NOTE — Progress Notes (Signed)
Pt refusing CBG 

## 2018-01-25 NOTE — Progress Notes (Signed)
Call placed to SNF where pt returned to. Update provided to RN who stated she is familiar with him.

## 2018-01-25 NOTE — Progress Notes (Signed)
Pt with inability to void on command, bladder scan shows >38250ml. Per Dr Jarvis NewcomerGrunz, pt ok to discharge otherwise, will re-insert catheter for discharge treatment of likely present UTI per MD.  Pt will need follow up attempt to remove/void in 5-7 days. Discharge instructions will include detailed follow up instructions for facility.  Social work notified of change of plan, and MD requesting d/c of pt today. Per CSW we may not be able to send pt today, but we can try.  Foley to be re-inserted in the coming minutes.

## 2018-01-25 NOTE — Progress Notes (Signed)
Patient's pain medication rx is on his hard chart in the cabinet.

## 2018-01-25 NOTE — Progress Notes (Signed)
Pt has not voided since foley removal 2.5 hours ago. Will continue to recheck.

## 2018-01-26 LAB — CULTURE, BLOOD (ROUTINE X 2)
CULTURE: NO GROWTH
Culture: NO GROWTH
SPECIAL REQUESTS: ADEQUATE
Special Requests: ADEQUATE

## 2018-01-29 ENCOUNTER — Ambulatory Visit: Payer: Medicare Other | Admitting: Physician Assistant

## 2018-02-03 ENCOUNTER — Other Ambulatory Visit (HOSPITAL_COMMUNITY): Payer: Self-pay | Admitting: *Deleted

## 2018-02-04 ENCOUNTER — Encounter (HOSPITAL_COMMUNITY): Payer: Medicare Other

## 2018-02-07 ENCOUNTER — Inpatient Hospital Stay (HOSPITAL_COMMUNITY)
Admission: RE | Admit: 2018-02-07 | Discharge: 2018-02-07 | Disposition: A | Discharge status: Home / Self Care | Payer: Medicare Other | Source: Ambulatory Visit | Attending: Nephrology | Admitting: Nephrology

## 2018-02-19 DEATH — deceased

## 2018-03-04 ENCOUNTER — Inpatient Hospital Stay (HOSPITAL_COMMUNITY)
Admission: RE | Admit: 2018-03-04 | Discharge: 2018-03-04 | Disposition: A | Discharge status: Home / Self Care | Payer: Medicare Other | Source: Ambulatory Visit | Attending: Nephrology | Admitting: Nephrology

## 2018-03-04 ENCOUNTER — Encounter (HOSPITAL_COMMUNITY): Payer: Self-pay

## 2018-03-07 ENCOUNTER — Encounter (HOSPITAL_COMMUNITY): Payer: Medicare Other

## 2018-04-01 ENCOUNTER — Inpatient Hospital Stay (HOSPITAL_COMMUNITY)
Admission: RE | Admit: 2018-04-01 | Discharge: 2018-04-01 | Disposition: A | Discharge status: Home / Self Care | Payer: Medicare Other | Source: Ambulatory Visit | Attending: Nephrology | Admitting: Nephrology

## 2018-04-29 ENCOUNTER — Inpatient Hospital Stay (HOSPITAL_COMMUNITY)
Admission: RE | Admit: 2018-04-29 | Discharge: 2018-04-29 | Disposition: A | Discharge status: Home / Self Care | Source: Ambulatory Visit | Attending: Nephrology | Admitting: Nephrology

## 2018-05-27 ENCOUNTER — Encounter (HOSPITAL_COMMUNITY): Payer: Self-pay

## 2019-07-29 IMAGING — US US RENAL
1 series · 14 of 25 positions shown · non-contrast
Comparison: 10/01/2013

CLINICAL DATA: Acute renal failure. History of diabetes and
hypertension.

EXAM:
RENAL / URINARY TRACT ULTRASOUND COMPLETE

[Series 1: us renal · 0.22mm/px · 14 of 38 slices shown]
[im 1/38]
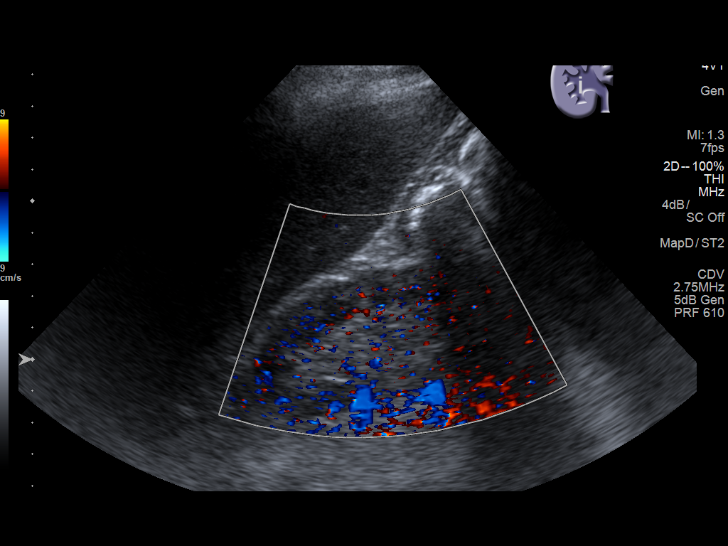
[im 4/38]
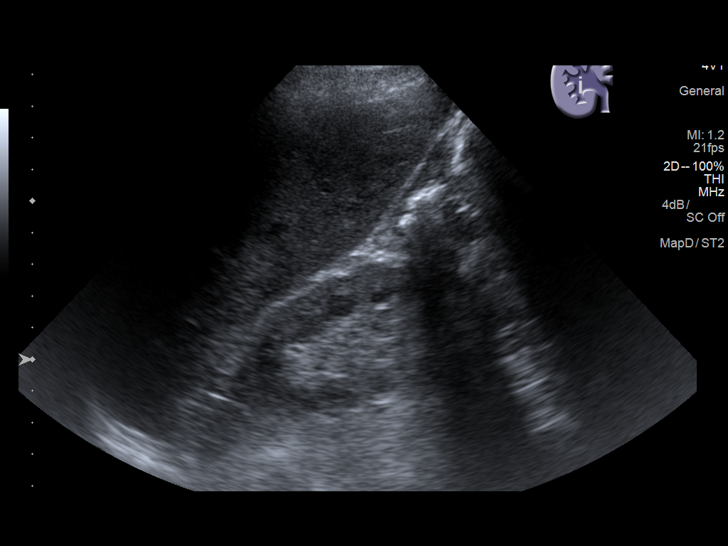
[im 7/38]
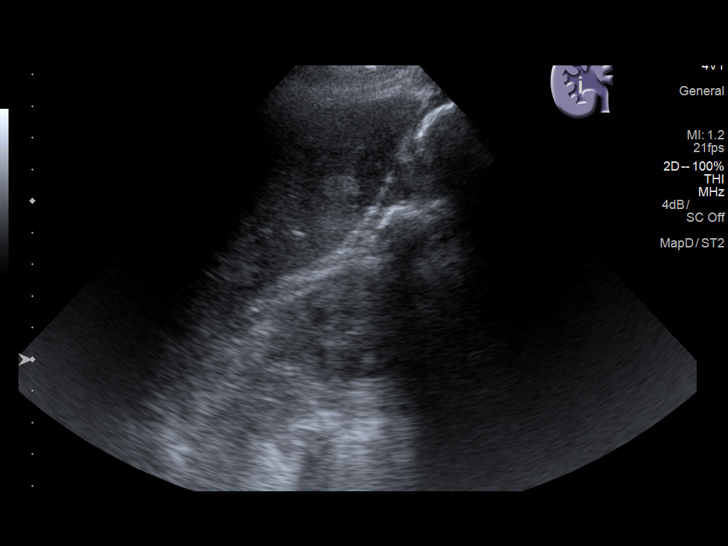
[im 10/38]
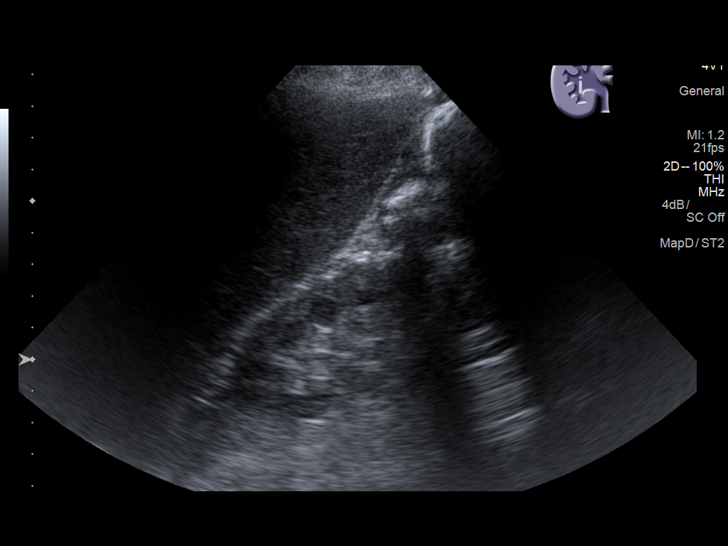
[im 13/38]
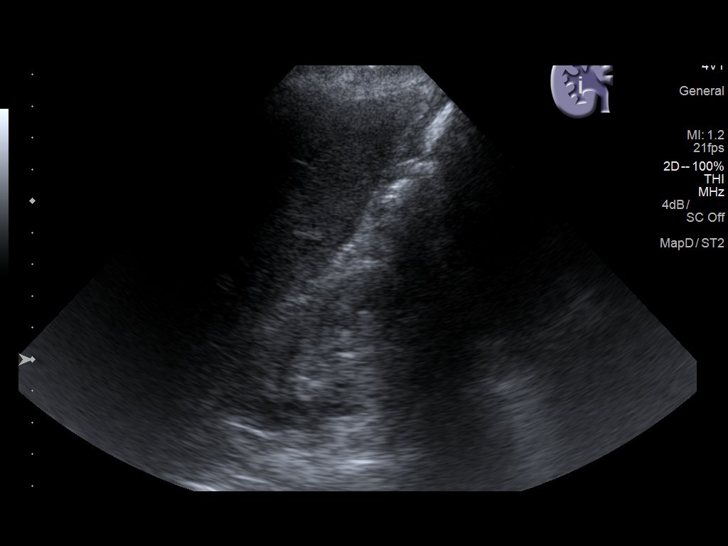
[im 14/38]
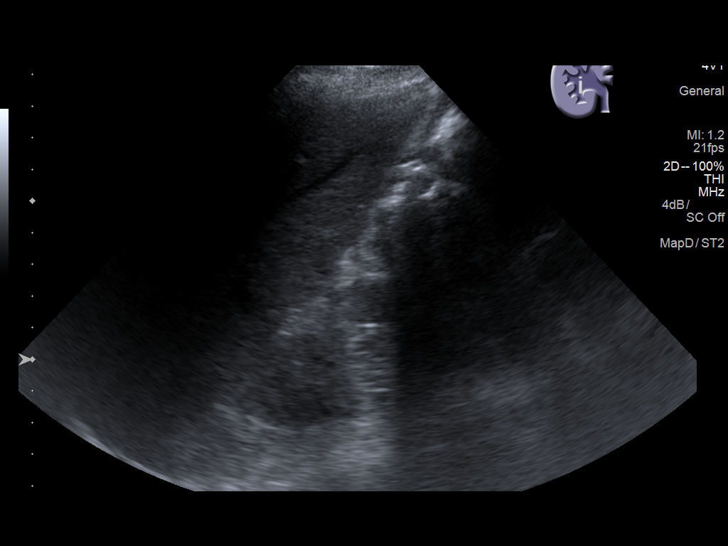
[im 17/38]
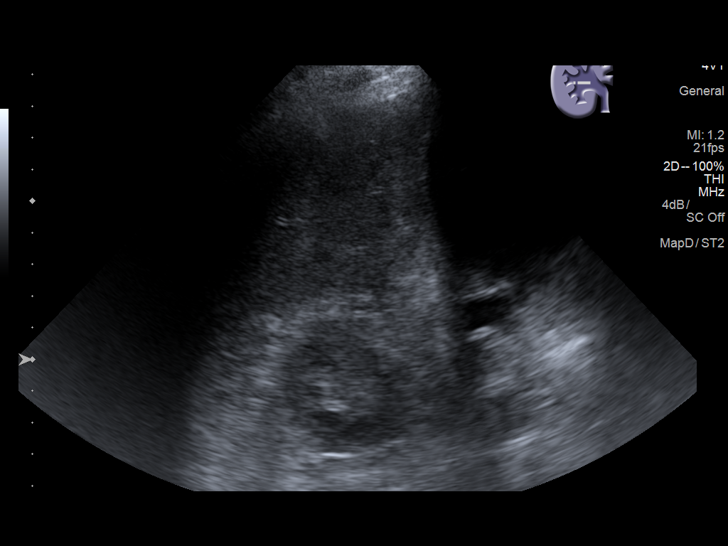
[im 21/38]
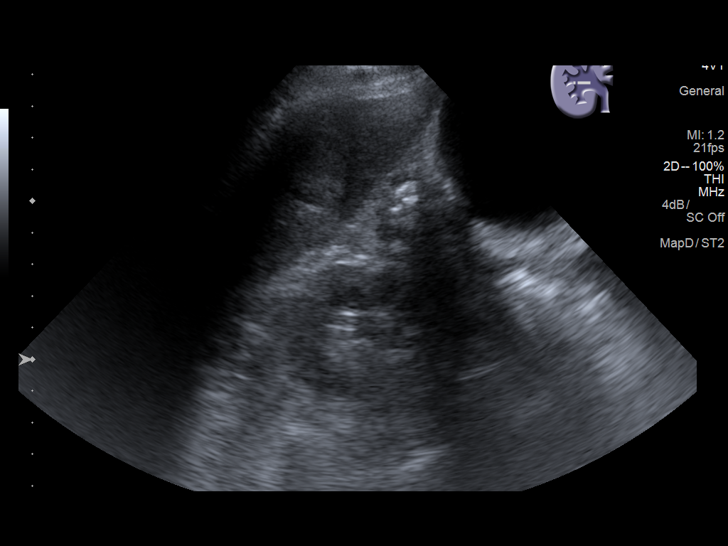
[im 24/38]
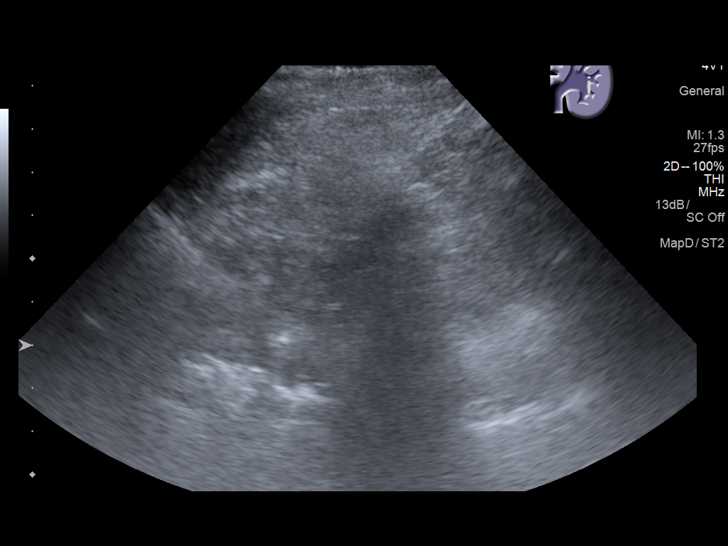
[im 25/38]
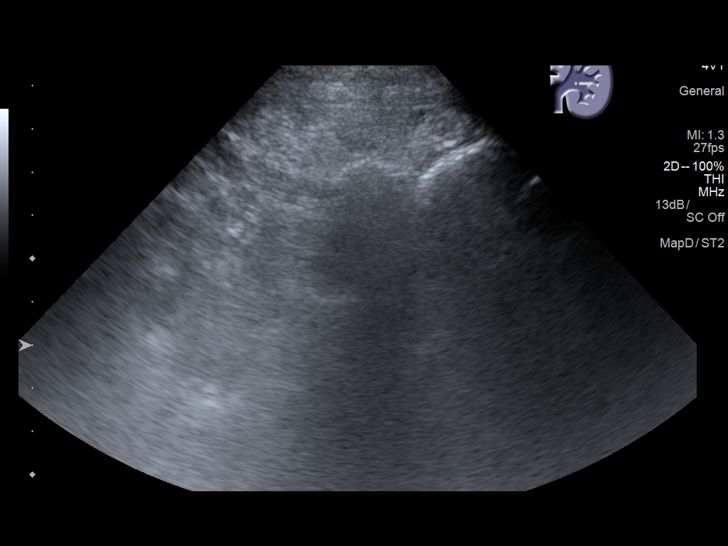
[im 28/38]
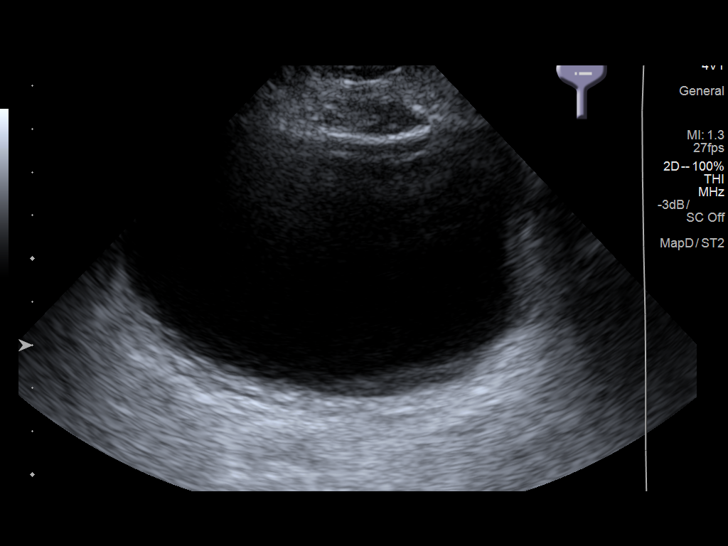
[im 31/38]
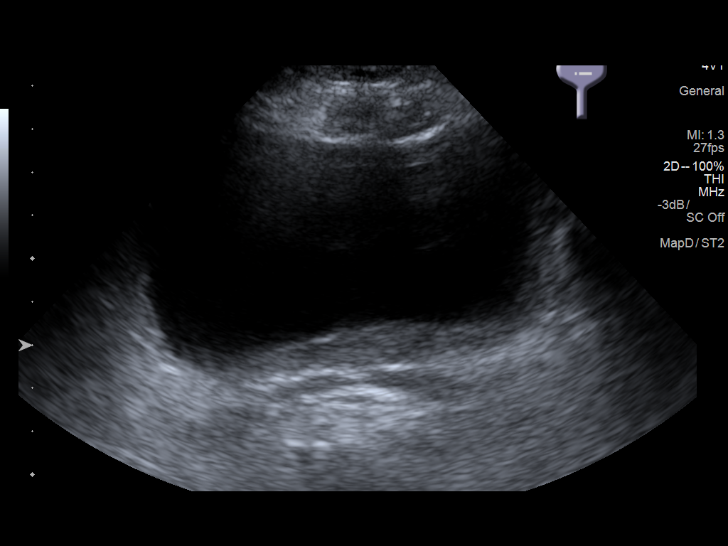
[im 34/38]
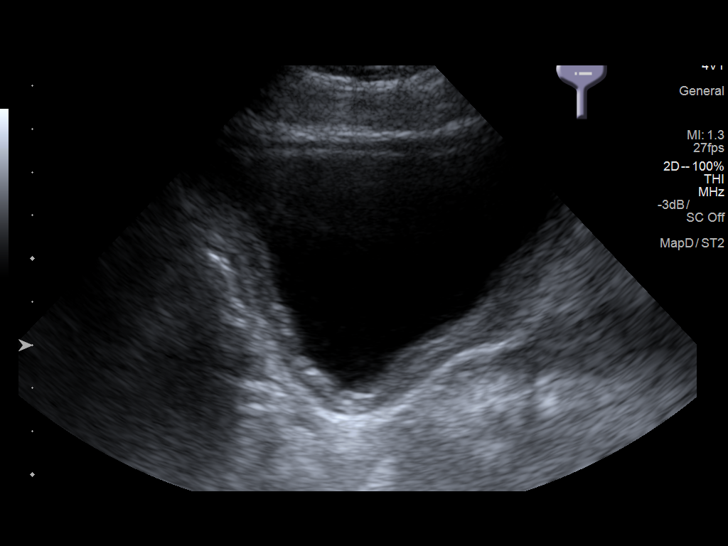
[im 38/38]
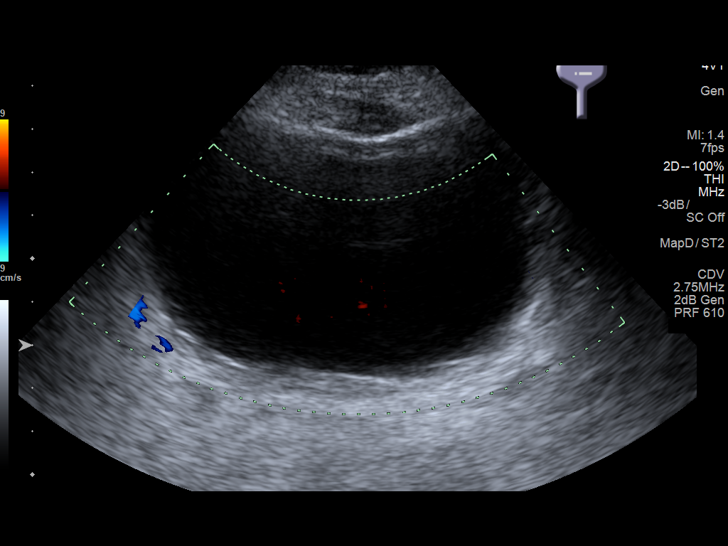

[14 of 25 positions shown; findings below may reference images not displayed]

FINDINGS: Right Kidney:

Length: 8.9 cm. Diffusely increased parenchymal echotexture and mild
parenchymal thinning. No hydronephrosis or solid mass identified.
Lower pole is partially obscured by overlying bowel gas.

Left Kidney:

Not able to visualize the left kidney due to overlying bowel gas.

Bladder:

Bladder is moderately distended. Mild bladder wall thickening at
mm. No intraluminal filling defects.
IMPRESSION: 1. Changes consistent with chronic medical renal disease on the
right kidney. No hydronephrosis.
2. Unable to visualize the left kidney due to overlying bowel gas.
3. Diffuse bladder wall thickening may indicate cystitis.

## 2020-01-31 IMAGING — CT CT HEAD W/O CM
5 series · 17 of 47 positions shown, 19 images · non-contrast
Comparison: None.

CLINICAL DATA: Altered mental status and combativeness for 2 weeks.

EXAM:
CT HEAD WITHOUT CONTRAST
TECHNIQUE: Contiguous axial images were obtained from the base of the skull
through the vertex without intravenous contrast.

[Series 2: head wo · axial · 0.39mm/px · z∈[-128,-28]mm · 4 of 34 slices shown (1 of 2)]
[im 7/34  brain]
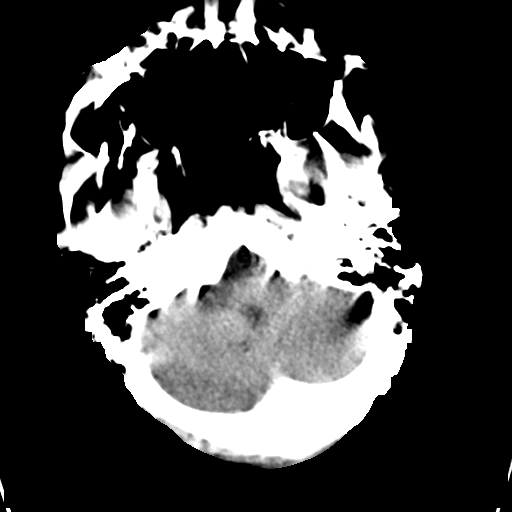
[im 14/34  brain]
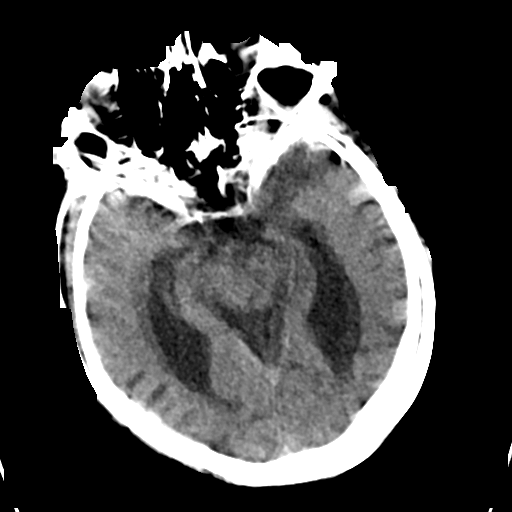
[im 20/34  brain]
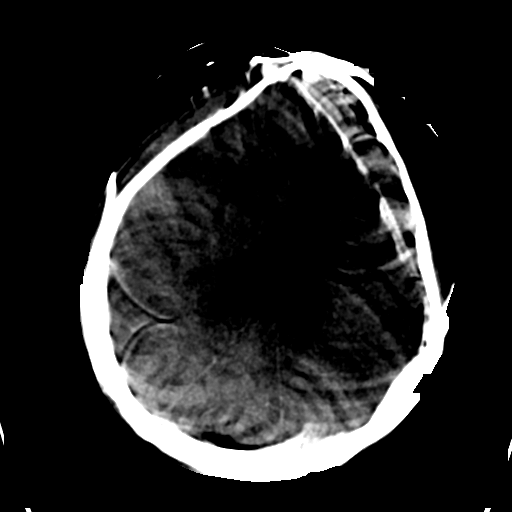
[im 27/34  brain]
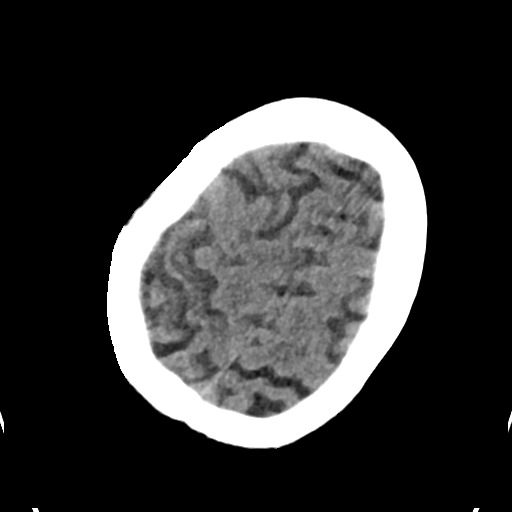

[Series 3: head wo · axial · 0.39mm/px · z∈[-132,-22]mm · 5 of 34 slices shown, 7 images (2 of 2)]
[im 6/34  brain]
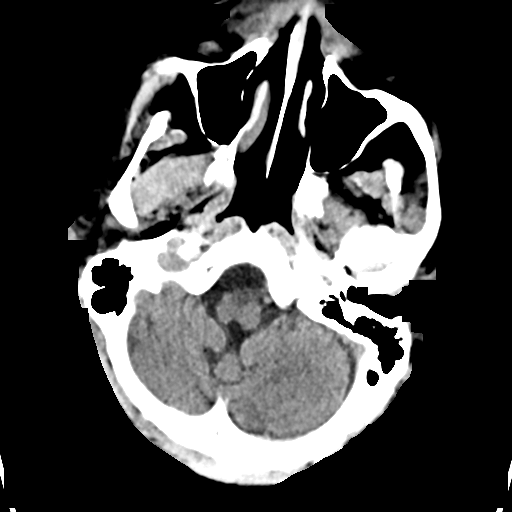
[im 6/34  bone]
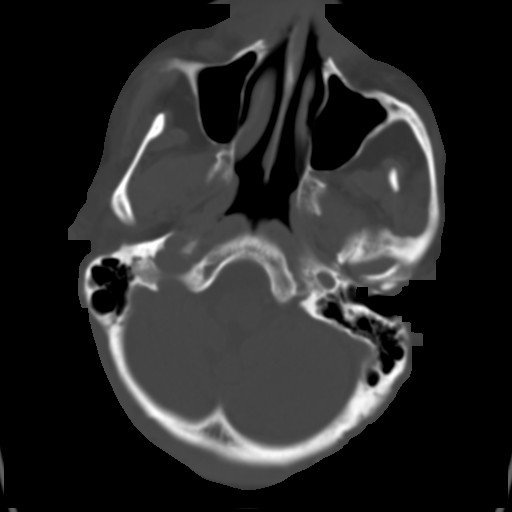
[im 12/34  brain]
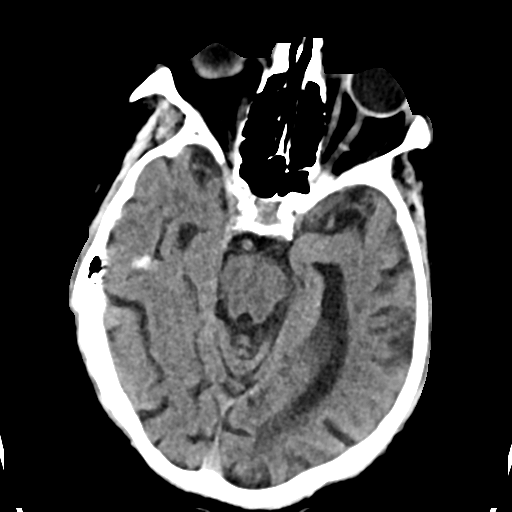
[im 17/34  brain]
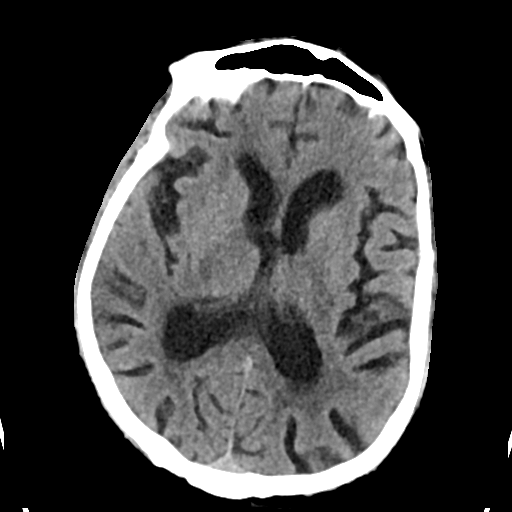
[im 23/34  brain]
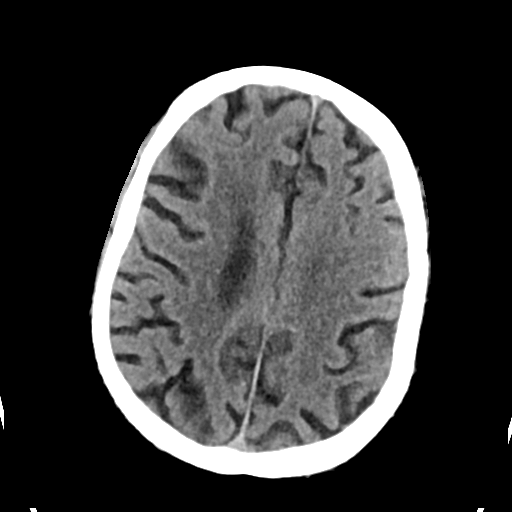
[im 28/34  brain]
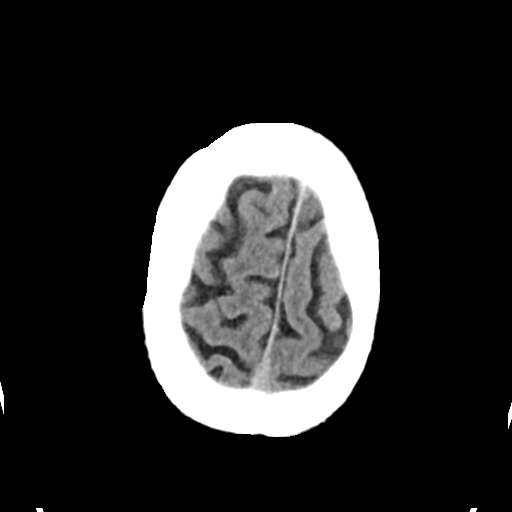
[im 28/34  bone]
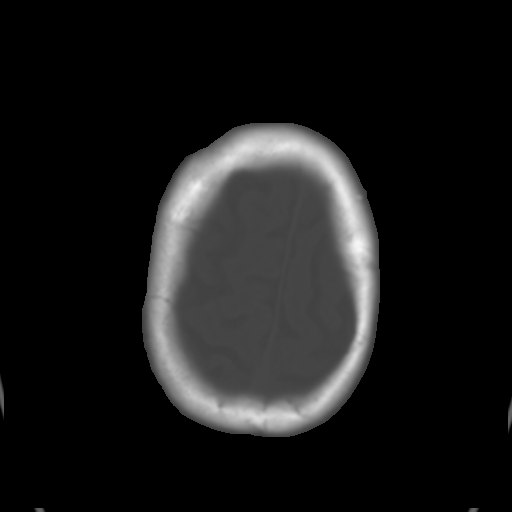

[Series 4: head bone · axial · 0.39mm/px · z∈[-148,-128]mm · 2 of 85 slices shown]
[im 6/85  bone]
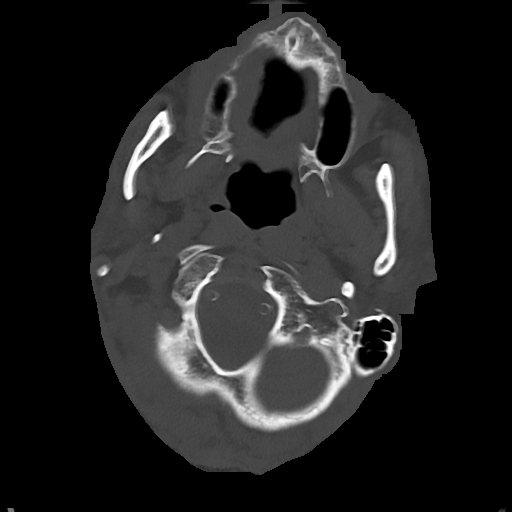
[im 16/85  bone]
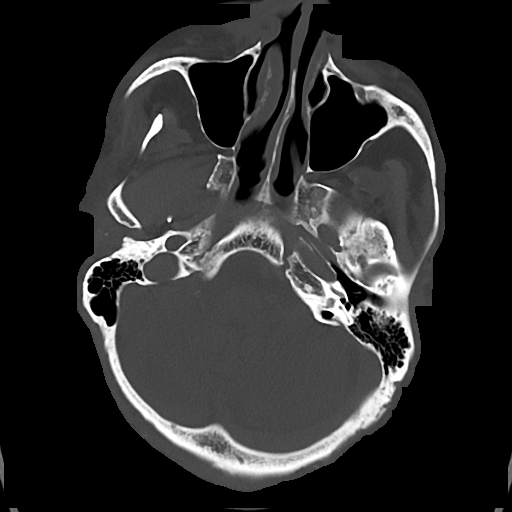

[Series 5: cor soft · coronal · 0.32mm/px · 3 of 67 slices shown]
[im 23/67  brain]
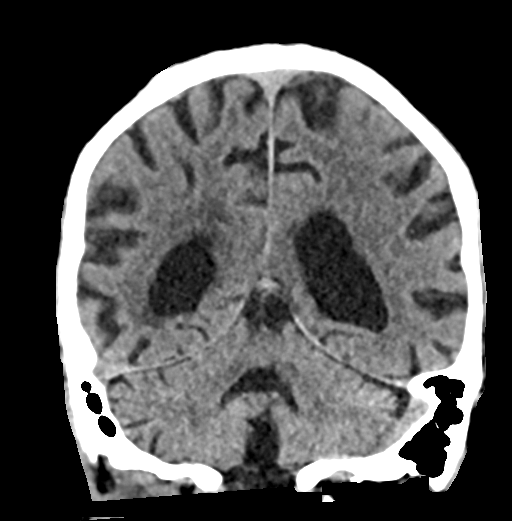
[im 30/67  brain]
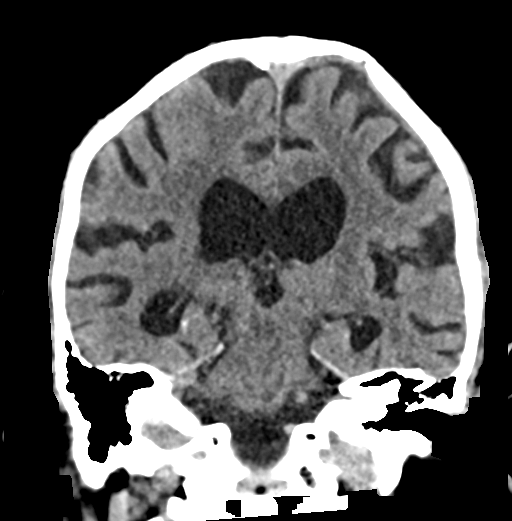
[im 37/67  brain]
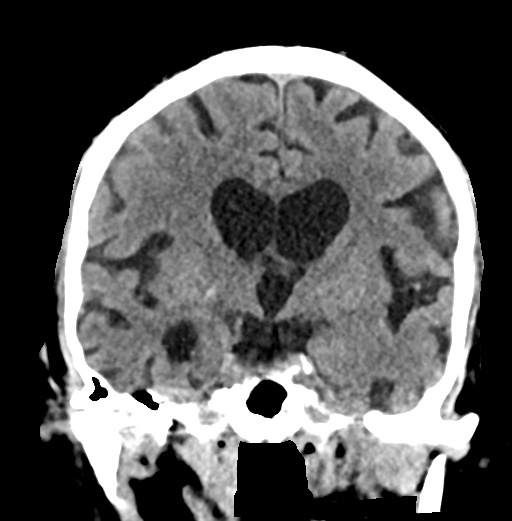

[Series 6: sag soft · sagittal · 0.33mm/px · 3 of 55 slices shown]
[im 20/55  brain]
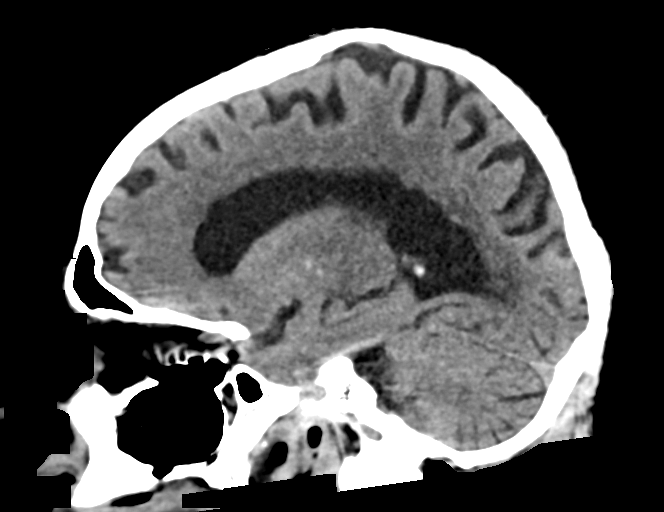
[im 28/55  brain]
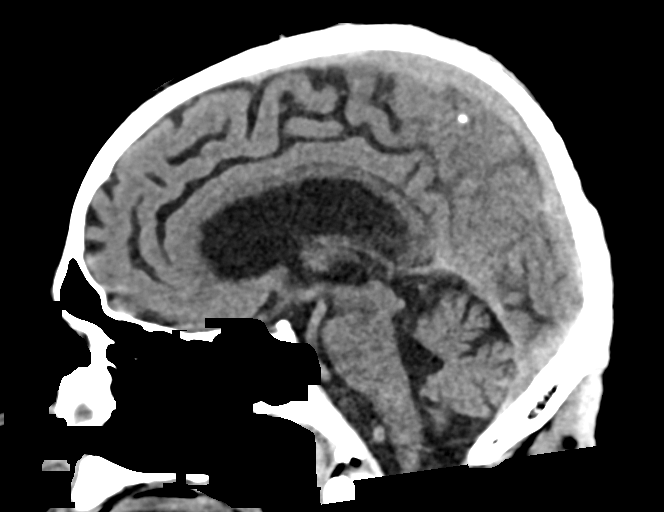
[im 35/55  brain]
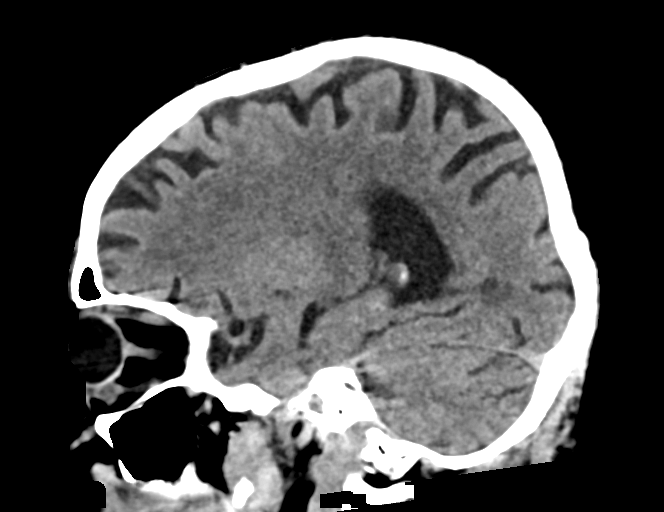

[17 of 47 positions shown; findings below may reference images not displayed]

FINDINGS: Brain: No evidence of acute infarction, hemorrhage, hydrocephalus,
extra-axial collection or mass lesion/mass effect. Cortical atrophy
is noted.

Vascular: Extensive atherosclerosis.

Skull: Intact.

Sinuses/Orbits: No acute abnormality. The patient is status post
bilateral lens extraction.

Other: None.
IMPRESSION: No acute abnormality.

Atrophy.

Extensive atherosclerosis.
